# Patient Record
Sex: Male | Born: 1950 | Race: Black or African American | Hispanic: No | Marital: Married | Smoking: Never smoker
Health system: Southern US, Community
[De-identification: ages and names within clinical notes are randomized; demographics above are authoritative.]

## PROBLEM LIST (undated history)

## (undated) DIAGNOSIS — E785 Hyperlipidemia, unspecified: Secondary | ICD-10-CM

## (undated) DIAGNOSIS — I6523 Occlusion and stenosis of bilateral carotid arteries: Secondary | ICD-10-CM

## (undated) DIAGNOSIS — N400 Enlarged prostate without lower urinary tract symptoms: Secondary | ICD-10-CM

## (undated) DIAGNOSIS — R3912 Poor urinary stream: Secondary | ICD-10-CM

## (undated) DIAGNOSIS — D509 Iron deficiency anemia, unspecified: Secondary | ICD-10-CM

## (undated) DIAGNOSIS — M1990 Unspecified osteoarthritis, unspecified site: Secondary | ICD-10-CM

## (undated) DIAGNOSIS — I454 Nonspecific intraventricular block: Secondary | ICD-10-CM

## (undated) DIAGNOSIS — Z973 Presence of spectacles and contact lenses: Secondary | ICD-10-CM

## (undated) DIAGNOSIS — R351 Nocturia: Secondary | ICD-10-CM

## (undated) DIAGNOSIS — E291 Testicular hypofunction: Secondary | ICD-10-CM

## (undated) DIAGNOSIS — I10 Essential (primary) hypertension: Secondary | ICD-10-CM

## (undated) DIAGNOSIS — E119 Type 2 diabetes mellitus without complications: Secondary | ICD-10-CM

## (undated) DIAGNOSIS — N529 Male erectile dysfunction, unspecified: Secondary | ICD-10-CM

## (undated) HISTORY — DX: Nonspecific intraventricular block: I45.4

## (undated) HISTORY — PX: WISDOM TOOTH EXTRACTION: SHX21

## (undated) HISTORY — DX: Essential (primary) hypertension: I10

## (undated) HISTORY — DX: Testicular hypofunction: E29.1

## (undated) HISTORY — PX: SHOULDER SURGERY: SHX246

## (undated) HISTORY — DX: Male erectile dysfunction, unspecified: N52.9

## (undated) HISTORY — PX: COLONOSCOPY: SHX174

---

## 2001-12-04 ENCOUNTER — Ambulatory Visit (HOSPITAL_COMMUNITY): Admission: RE | Admit: 2001-12-04 | Discharge: 2001-12-04 | Payer: Self-pay | Admitting: Orthopedic Surgery

## 2001-12-04 ENCOUNTER — Encounter: Payer: Self-pay | Admitting: Orthopedic Surgery

## 2002-01-18 ENCOUNTER — Encounter: Admission: RE | Admit: 2002-01-18 | Discharge: 2002-01-18 | Payer: Self-pay | Admitting: Orthopedic Surgery

## 2002-01-18 ENCOUNTER — Encounter: Payer: Self-pay | Admitting: Orthopedic Surgery

## 2003-11-17 ENCOUNTER — Ambulatory Visit: Payer: Self-pay | Admitting: Adult Health

## 2003-12-12 ENCOUNTER — Ambulatory Visit: Payer: Self-pay | Admitting: Pulmonary Disease

## 2004-09-03 ENCOUNTER — Ambulatory Visit: Payer: Self-pay | Admitting: Pulmonary Disease

## 2004-10-05 ENCOUNTER — Ambulatory Visit: Payer: Self-pay | Admitting: Pulmonary Disease

## 2004-11-18 ENCOUNTER — Ambulatory Visit: Payer: Self-pay | Admitting: Pulmonary Disease

## 2004-11-22 ENCOUNTER — Ambulatory Visit: Payer: Self-pay | Admitting: Pulmonary Disease

## 2005-05-23 ENCOUNTER — Ambulatory Visit: Payer: Self-pay | Admitting: Pulmonary Disease

## 2005-05-25 ENCOUNTER — Ambulatory Visit: Payer: Self-pay | Admitting: Pulmonary Disease

## 2005-10-20 ENCOUNTER — Ambulatory Visit: Payer: Self-pay | Admitting: Pulmonary Disease

## 2005-10-31 ENCOUNTER — Ambulatory Visit: Payer: Self-pay | Admitting: Pulmonary Disease

## 2006-03-03 ENCOUNTER — Emergency Department (HOSPITAL_COMMUNITY): Admission: EM | Admit: 2006-03-03 | Discharge: 2006-03-03 | Payer: Self-pay | Admitting: Emergency Medicine

## 2006-05-01 ENCOUNTER — Ambulatory Visit: Payer: Self-pay | Admitting: Pulmonary Disease

## 2006-05-01 LAB — CONVERTED CEMR LAB
ALT: 24 units/L (ref 0–40)
AST: 25 units/L (ref 0–37)
Albumin: 3.8 g/dL (ref 3.5–5.2)
Alkaline Phosphatase: 54 units/L (ref 39–117)
BUN: 12 mg/dL (ref 6–23)
Basophils Absolute: 0 10*3/uL (ref 0.0–0.1)
Basophils Relative: 0.5 % (ref 0.0–1.0)
Bilirubin, Direct: 0.1 mg/dL (ref 0.0–0.3)
CO2: 31 meq/L (ref 19–32)
Calcium: 9.1 mg/dL (ref 8.4–10.5)
Chloride: 107 meq/L (ref 96–112)
Cholesterol: 191 mg/dL (ref 0–200)
Creatinine, Ser: 1 mg/dL (ref 0.4–1.5)
Eosinophils Absolute: 0.2 10*3/uL (ref 0.0–0.6)
Eosinophils Relative: 2.5 % (ref 0.0–5.0)
GFR calc Af Amer: 100 mL/min
GFR calc non Af Amer: 83 mL/min
Glucose, Bld: 123 mg/dL — ABNORMAL HIGH (ref 70–99)
HCT: 43.8 % (ref 39.0–52.0)
HDL: 59.8 mg/dL (ref >39.0)
Hemoglobin: 14.9 g/dL (ref 13.0–17.0)
Hgb A1c MFr Bld: 7.1 % — ABNORMAL HIGH (ref 4.6–6.0)
LDL Cholesterol: 119 mg/dL — ABNORMAL HIGH (ref 0–99)
Lymphocytes Relative: 29.5 % (ref 12.0–46.0)
MCHC: 34.1 g/dL (ref 30.0–36.0)
MCV: 82.9 fL (ref 78.0–100.0)
Monocytes Absolute: 0.4 10*3/uL (ref 0.2–0.7)
Monocytes Relative: 5.6 % (ref 3.0–11.0)
Neutro Abs: 4.1 10*3/uL (ref 1.4–7.7)
Neutrophils Relative %: 61.9 % (ref 43.0–77.0)
PSA: 0.73 ng/mL (ref 0.10–4.00)
Platelets: 209 10*3/uL (ref 150–400)
Potassium: 4 meq/L (ref 3.5–5.1)
RBC: 5.29 M/uL (ref 4.22–5.81)
RDW: 12.8 % (ref 11.5–14.6)
Sodium: 142 meq/L (ref 135–145)
TSH: 1.09 microintl units/mL (ref 0.35–5.50)
Total Bilirubin: 0.8 mg/dL (ref 0.3–1.2)
Total CHOL/HDL Ratio: 3.2
Total Protein: 6.7 g/dL (ref 6.0–8.3)
Triglycerides: 59 mg/dL (ref 0–149)
VLDL: 12 mg/dL (ref 0–40)
WBC: 6.7 10*3/uL (ref 4.5–10.5)

## 2006-08-10 ENCOUNTER — Ambulatory Visit: Payer: Self-pay | Admitting: Emergency Medicine

## 2006-08-25 ENCOUNTER — Ambulatory Visit: Payer: Self-pay | Admitting: Pulmonary Disease

## 2006-08-25 LAB — CONVERTED CEMR LAB
BUN: 9 mg/dL (ref 6–23)
CO2: 30 meq/L (ref 19–32)
Calcium: 9.2 mg/dL (ref 8.4–10.5)
Chloride: 101 meq/L (ref 96–112)
Creatinine, Ser: 0.9 mg/dL (ref 0.4–1.5)
GFR calc Af Amer: 113 mL/min
GFR calc non Af Amer: 93 mL/min
Glucose, Bld: 86 mg/dL (ref 70–99)
Hgb A1c MFr Bld: 6.4 % — ABNORMAL HIGH (ref 4.6–6.0)
Potassium: 3.8 meq/L (ref 3.5–5.1)
Sodium: 139 meq/L (ref 135–145)

## 2006-11-09 ENCOUNTER — Ambulatory Visit: Payer: Self-pay | Admitting: Pulmonary Disease

## 2006-11-09 DIAGNOSIS — IMO0001 Reserved for inherently not codable concepts without codable children: Secondary | ICD-10-CM

## 2006-11-09 DIAGNOSIS — E785 Hyperlipidemia, unspecified: Secondary | ICD-10-CM

## 2006-11-09 DIAGNOSIS — I10 Essential (primary) hypertension: Secondary | ICD-10-CM | POA: Insufficient documentation

## 2006-11-16 ENCOUNTER — Ambulatory Visit: Payer: Self-pay

## 2006-12-14 ENCOUNTER — Telehealth (INDEPENDENT_AMBULATORY_CARE_PROVIDER_SITE_OTHER): Payer: Self-pay | Admitting: *Deleted

## 2006-12-15 ENCOUNTER — Telehealth (INDEPENDENT_AMBULATORY_CARE_PROVIDER_SITE_OTHER): Payer: Self-pay | Admitting: *Deleted

## 2007-03-27 ENCOUNTER — Ambulatory Visit: Payer: Self-pay | Admitting: Pulmonary Disease

## 2007-03-27 DIAGNOSIS — J40 Bronchitis, not specified as acute or chronic: Secondary | ICD-10-CM | POA: Insufficient documentation

## 2007-03-27 DIAGNOSIS — E119 Type 2 diabetes mellitus without complications: Secondary | ICD-10-CM | POA: Insufficient documentation

## 2007-03-27 DIAGNOSIS — I679 Cerebrovascular disease, unspecified: Secondary | ICD-10-CM

## 2007-04-01 LAB — CONVERTED CEMR LAB
BUN: 9 mg/dL (ref 6–23)
CO2: 30 meq/L (ref 19–32)
Calcium: 9.6 mg/dL (ref 8.4–10.5)
Chloride: 101 meq/L (ref 96–112)
Creatinine, Ser: 0.9 mg/dL (ref 0.4–1.5)
GFR calc Af Amer: 113 mL/min
GFR calc non Af Amer: 93 mL/min
Glucose, Bld: 95 mg/dL (ref 70–99)
Hgb A1c MFr Bld: 6.9 % — ABNORMAL HIGH (ref 4.6–6.0)
Potassium: 4.4 meq/L (ref 3.5–5.1)
Sodium: 138 meq/L (ref 135–145)

## 2007-04-12 ENCOUNTER — Telehealth: Payer: Self-pay | Admitting: Pulmonary Disease

## 2007-05-11 ENCOUNTER — Telehealth: Payer: Self-pay | Admitting: Pulmonary Disease

## 2007-06-27 ENCOUNTER — Ambulatory Visit: Payer: Self-pay | Admitting: Internal Medicine

## 2007-06-27 DIAGNOSIS — K140 Glossitis: Secondary | ICD-10-CM

## 2007-07-02 ENCOUNTER — Telehealth: Payer: Self-pay | Admitting: Pulmonary Disease

## 2007-09-27 ENCOUNTER — Ambulatory Visit: Payer: Self-pay | Admitting: Pulmonary Disease

## 2007-10-01 ENCOUNTER — Ambulatory Visit: Payer: Self-pay | Admitting: Pulmonary Disease

## 2007-10-04 LAB — CONVERTED CEMR LAB
ALT: 24 units/L (ref 0–53)
AST: 26 units/L (ref 0–37)
Albumin: 3.7 g/dL (ref 3.5–5.2)
Alkaline Phosphatase: 48 units/L (ref 39–117)
BUN: 13 mg/dL (ref 6–23)
Basophils Absolute: 0 10*3/uL (ref 0.0–0.1)
Basophils Relative: 0.3 % (ref 0.0–3.0)
Bilirubin, Direct: 0.1 mg/dL (ref 0.0–0.3)
CO2: 33 meq/L — ABNORMAL HIGH (ref 19–32)
Calcium: 9 mg/dL (ref 8.4–10.5)
Chloride: 105 meq/L (ref 96–112)
Cholesterol: 186 mg/dL (ref 0–200)
Creatinine, Ser: 1 mg/dL (ref 0.4–1.5)
Eosinophils Absolute: 0.1 10*3/uL (ref 0.0–0.7)
Eosinophils Relative: 2.2 % (ref 0.0–5.0)
GFR calc Af Amer: 100 mL/min
GFR calc non Af Amer: 82 mL/min
Glucose, Bld: 123 mg/dL — ABNORMAL HIGH (ref 70–99)
HCT: 43.3 % (ref 39.0–52.0)
HDL: 60.6 mg/dL (ref >39.0)
Hemoglobin: 14.4 g/dL (ref 13.0–17.0)
Hgb A1c MFr Bld: 6.8 % — ABNORMAL HIGH (ref 4.6–6.0)
LDL Cholesterol: 116 mg/dL — ABNORMAL HIGH (ref 0–99)
Lymphocytes Relative: 40.2 % (ref 12.0–46.0)
MCHC: 33.2 g/dL (ref 30.0–36.0)
MCV: 86.9 fL (ref 78.0–100.0)
Monocytes Absolute: 0.5 10*3/uL (ref 0.1–1.0)
Monocytes Relative: 7.7 % (ref 3.0–12.0)
Neutro Abs: 3.1 10*3/uL (ref 1.4–7.7)
Neutrophils Relative %: 49.6 % (ref 43.0–77.0)
PSA: 0.6 ng/mL (ref 0.10–4.00)
Platelets: 203 10*3/uL (ref 150–400)
Potassium: 4.2 meq/L (ref 3.5–5.1)
RBC: 4.98 M/uL (ref 4.22–5.81)
RDW: 13.3 % (ref 11.5–14.6)
Sodium: 143 meq/L (ref 135–145)
TSH: 2.23 microintl units/mL (ref 0.35–5.50)
Total Bilirubin: 0.7 mg/dL (ref 0.3–1.2)
Total CHOL/HDL Ratio: 3.1
Total Protein: 6.5 g/dL (ref 6.0–8.3)
Triglycerides: 48 mg/dL (ref 0–149)
VLDL: 10 mg/dL (ref 0–40)
WBC: 6.2 10*3/uL (ref 4.5–10.5)

## 2008-03-25 ENCOUNTER — Ambulatory Visit: Payer: Self-pay | Admitting: Pulmonary Disease

## 2008-03-30 LAB — CONVERTED CEMR LAB
BUN: 11 mg/dL (ref 6–23)
CO2: 31 meq/L (ref 19–32)
Calcium: 9.4 mg/dL (ref 8.4–10.5)
Chloride: 102 meq/L (ref 96–112)
Creatinine, Ser: 0.9 mg/dL (ref 0.4–1.5)
GFR calc non Af Amer: 112.07 mL/min (ref >60)
Glucose, Bld: 85 mg/dL (ref 70–99)
Hgb A1c MFr Bld: 6.9 % — ABNORMAL HIGH (ref 4.6–6.5)
Potassium: 4.2 meq/L (ref 3.5–5.1)
Sodium: 141 meq/L (ref 135–145)

## 2008-05-29 ENCOUNTER — Telehealth: Payer: Self-pay | Admitting: Pulmonary Disease

## 2008-05-29 DIAGNOSIS — M25569 Pain in unspecified knee: Secondary | ICD-10-CM | POA: Insufficient documentation

## 2008-08-07 ENCOUNTER — Ambulatory Visit: Payer: Self-pay | Admitting: Adult Health

## 2008-08-07 LAB — CONVERTED CEMR LAB
BUN: 13 mg/dL (ref 6–23)
Bacteria, UA: NONE SEEN
Basophils Absolute: 0 10*3/uL (ref 0.0–0.1)
Basophils Relative: 1 % (ref 0–1)
Bilirubin Urine: NEGATIVE
CO2: 25 meq/L (ref 19–32)
Calcium: 9.6 mg/dL (ref 8.4–10.5)
Chloride: 101 meq/L (ref 96–112)
Creatinine, Ser: 0.96 mg/dL (ref 0.40–1.50)
Eosinophils Absolute: 0.1 10*3/uL (ref 0.0–0.7)
Eosinophils Relative: 2 % (ref 0–5)
Glucose, Bld: 95 mg/dL (ref 70–99)
HCT: 41.8 % (ref 39.0–52.0)
Hemoglobin, Urine: NEGATIVE
Hemoglobin: 14.4 g/dL (ref 13.0–17.0)
Hgb A1c MFr Bld: 6.5 % — ABNORMAL HIGH (ref 4.6–6.1)
Ketones, ur: NEGATIVE mg/dL
Leukocytes, UA: NEGATIVE
Lymphocytes Relative: 33 % (ref 12–46)
Lymphs Abs: 2.4 10*3/uL (ref 0.7–4.0)
MCHC: 34.4 g/dL (ref 30.0–36.0)
MCV: 80.9 fL (ref 78.0–100.0)
Monocytes Absolute: 0.5 10*3/uL (ref 0.1–1.0)
Monocytes Relative: 8 % (ref 3–12)
Neutro Abs: 4 10*3/uL (ref 1.7–7.7)
Neutrophils Relative %: 57 % (ref 43–77)
Nitrite: NEGATIVE
Platelets: 234 10*3/uL (ref 150–400)
Potassium: 4.4 meq/L (ref 3.5–5.3)
Protein, ur: NEGATIVE mg/dL
RBC / HPF: NONE SEEN (ref <3)
RBC: 5.17 M/uL (ref 4.22–5.81)
RDW: 13.7 % (ref 11.5–15.5)
Sodium: 140 meq/L (ref 135–145)
Specific Gravity, Urine: 1.016 (ref 1.005–1.030)
Urine Glucose: NEGATIVE mg/dL
Urobilinogen, UA: 0.2 (ref 0.0–1.0)
WBC, UA: NONE SEEN cells/hpf (ref <3)
WBC: 7 10*3/uL (ref 4.0–10.5)
pH: 5.5 (ref 5.0–8.0)

## 2008-08-14 ENCOUNTER — Telehealth: Payer: Self-pay | Admitting: Adult Health

## 2008-08-14 DIAGNOSIS — R42 Dizziness and giddiness: Secondary | ICD-10-CM | POA: Insufficient documentation

## 2008-09-22 ENCOUNTER — Ambulatory Visit: Payer: Self-pay | Admitting: Pulmonary Disease

## 2008-09-22 DIAGNOSIS — E291 Testicular hypofunction: Secondary | ICD-10-CM

## 2008-09-23 LAB — CONVERTED CEMR LAB
HDL: 57.1 mg/dL (ref >39.00)
LDL Cholesterol: 125 mg/dL — ABNORMAL HIGH (ref 0–99)
Total CHOL/HDL Ratio: 3
Triglycerides: 43 mg/dL (ref 0.0–149.0)
VLDL: 8.6 mg/dL (ref 0.0–40.0)

## 2008-11-18 ENCOUNTER — Encounter: Payer: Self-pay | Admitting: Pulmonary Disease

## 2008-11-19 ENCOUNTER — Ambulatory Visit: Payer: Self-pay

## 2008-11-19 ENCOUNTER — Encounter: Payer: Self-pay | Admitting: Pulmonary Disease

## 2008-12-22 ENCOUNTER — Encounter: Payer: Self-pay | Admitting: Pulmonary Disease

## 2008-12-30 ENCOUNTER — Ambulatory Visit: Payer: Self-pay | Admitting: Pulmonary Disease

## 2009-01-01 ENCOUNTER — Telehealth: Payer: Self-pay | Admitting: Pulmonary Disease

## 2009-04-30 ENCOUNTER — Emergency Department (HOSPITAL_COMMUNITY): Admission: EM | Admit: 2009-04-30 | Discharge: 2009-04-30 | Payer: Self-pay | Admitting: Family Medicine

## 2009-05-25 ENCOUNTER — Ambulatory Visit: Payer: Self-pay | Admitting: Pulmonary Disease

## 2009-05-25 DIAGNOSIS — N529 Male erectile dysfunction, unspecified: Secondary | ICD-10-CM

## 2009-05-26 ENCOUNTER — Ambulatory Visit: Payer: Self-pay | Admitting: Pulmonary Disease

## 2009-05-27 LAB — CONVERTED CEMR LAB
BUN: 14 mg/dL (ref 6–23)
CO2: 30 meq/L (ref 19–32)
Chloride: 107 meq/L (ref 96–112)
Creatinine, Ser: 0.8 mg/dL (ref 0.4–1.5)
Creatinine,U: 134 mg/dL
LDL Cholesterol: 135 mg/dL — ABNORMAL HIGH (ref 0–99)
Microalb Creat Ratio: 0.4 mg/g (ref 0.0–30.0)
Total CHOL/HDL Ratio: 4
Triglycerides: 39 mg/dL (ref 0.0–149.0)
VLDL: 7.8 mg/dL (ref 0.0–40.0)

## 2009-06-11 ENCOUNTER — Ambulatory Visit: Payer: Self-pay | Admitting: Endocrinology

## 2009-07-29 ENCOUNTER — Telehealth (INDEPENDENT_AMBULATORY_CARE_PROVIDER_SITE_OTHER): Payer: Self-pay | Admitting: *Deleted

## 2009-08-03 ENCOUNTER — Telehealth (INDEPENDENT_AMBULATORY_CARE_PROVIDER_SITE_OTHER): Payer: Self-pay | Admitting: *Deleted

## 2009-08-03 ENCOUNTER — Emergency Department (HOSPITAL_COMMUNITY): Admission: EM | Admit: 2009-08-03 | Discharge: 2009-08-03 | Payer: Self-pay | Admitting: Emergency Medicine

## 2009-09-09 ENCOUNTER — Telehealth: Payer: Self-pay | Admitting: Endocrinology

## 2009-09-11 ENCOUNTER — Ambulatory Visit: Payer: Self-pay | Admitting: Endocrinology

## 2009-09-22 ENCOUNTER — Ambulatory Visit: Payer: Self-pay | Admitting: Endocrinology

## 2009-12-07 ENCOUNTER — Ambulatory Visit: Payer: Self-pay | Admitting: Endocrinology

## 2010-02-04 NOTE — Progress Notes (Signed)
Summary: referral to ortho for knee pain  Phone Note Call from Patient Call back at Home Phone [phone]   Caller: Patient Call For: nadel Summary of Call: pt was seen at urgent care re: swollen L knee. needs a referral to RA dr.  Initial call taken by: Tivis Ringer, CNA,  August 03, 2009 5:11 PM  Follow-up for Phone Call        Pt c/o right throbbing knee pain with increased stiffness and swelling x 2 weeks. Pt was seen today @ [facility] and was given Voltaren Gel 1% four times a day. Pt states has been taking Mobic 7.5mg  Take 1 tablet by mouth once a day w/o relief. Pt has been elevating leg and "taking it easy". I asked pt if ok that SN address same in the AM and he agreed it is not a problem and wants to wait for SN recs. Please advise. Thanks. Asberry Lascola CMA  August 03, 2009 5:45 PM   Additional Follow-up for Phone Call Additional follow up Details #1::        per SN---rec ortho eval---either orthopedist of his choice or refer to Dr. Ivar Drape CMA  August 04, 2009 8:58 AM   called spoke with patient.  he states that he has seen an ortho doc before where we referred him in the past.  per EMR this was Dr. Angelia Mould.  pt okay to see him/her again.  order placed in EMR for appt. Additional Follow-up by: Boone Master CNA/MA,  August 04, 2009 10:06 AM    New/Updated Medications: VOLTAREN 1 % GEL (DICLOFENAC SODIUM) apply four times a day

## 2010-02-04 NOTE — Progress Notes (Signed)
Summary: refill  Phone Note Call from Patient   Caller: Patient Call For: nadel Summary of Call: pt requests refill for [facility] 7.5mg - [facility] [address] (pt says he was told to call here for this). pt work # [phone] Initial call taken by: Tivis Ringer, CNA,  July 29, 2009 10:11 AM  Follow-up for Phone Call        lmovm for pt to make him aware that the mobic has been sent to his pharmacy-- Randell Loop CMA  July 29, 2009 10:18 AM     Prescriptions: MOBIC 7.5 MG TABS (MELOXICAM) take 1 tab as needed for arthritis pain...  #30 x 3   Entered by:   Randell Loop CMA   Authorized by:   Michele Mcalpine MD   Signed by:   Randell Loop CMA on 07/29/2009   Method used:   Electronically to        [facility] [address] (retail)       [phone]       [place], [place]  [address]       Ph: [phone]       Fax: [phone]   RxID:   [id number]

## 2010-02-04 NOTE — Progress Notes (Signed)
Summary: metformin  Phone Note Refill Request Message from:  Fax from Pharmacy on September 09, 2009 9:03 AM  Refills Requested: Medication #1:  METFORMIN HCL 500 MG XR24H-TAB 2 tabs two times a day   Dosage confirmed as above?Dosage Confirmed   Notes: 90 day supply  Method Requested: Fax to Mail Away Pharmacy Initial call taken by: Brenton Grills MA,  September 09, 2009 9:04 AM    Prescriptions: METFORMIN HCL 500 MG XR24H-TAB (METFORMIN HCL) 2 tabs two times a day  #360 x 2   Entered by:   Brenton Grills MA   Authorized by:   Minus Breeding MD   Signed by:   Brenton Grills MA on 09/09/2009   Method used:   Faxed to ...       [facility] (mail-order)             , [place]         Ph: [phone]       Fax: [phone]   RxID:   [id number]

## 2010-02-04 NOTE — Assessment & Plan Note (Signed)
Summary: PER ASHLEY OV--STC   Vital Signs:  Patient profile:   60 year old male Height:      67 inches (170.18 cm) Weight:      162.13 pounds (73.70 kg) BMI:     25.48 O2 Sat:      98 % on Room air Temp:     97.7 degrees F (36.50 degrees C) oral Pulse rate:   53 / minute BP sitting:   128 / 80  (left arm) Cuff size:   regular  Vitals Entered By: Brenton Grills MA (September 22, 2009 7:57 AM)  O2 Flow:  Room air CC: F/U appt/aj Is Patient Diabetic? Yes   Primary Wael Maestas:  [name]  CC:  F/U appt/aj.  History of Present Illness: pt states he feels well in general.  he takes metformin as rx'ed.   pt states few mos of moderate polyuria, but no edema of the legs.  no assoc weight gain.  Current Medications (verified): 1)  Adult Aspirin Ec Low Strength 81 Mg  Tbec (Aspirin) .... Take 1 Tablet By Mouth Once A Day 2)  Atenolol 50 Mg Tabs (Atenolol) .... Take 1 Tab By Mouth Once Daily.Marland KitchenMarland Kitchen 3)  Viagra 100 Mg Tabs (Sildenafil Citrate) .... Use As Directed... 4)  Mobic 7.5 Mg Tabs (Meloxicam) .... Take 1 Tab As Needed For Arthritis Pain... 5)  Multivitamins   Tabs (Multiple Vitamin) .... Take 1 Tablet By Mouth Once A Day 6)  Metformin Hcl 500 Mg Xr24h-Tab (Metformin Hcl) .... 2 Tabs Two Times A Day 7)  Voltaren 1 % Gel (Diclofenac Sodium) .... Apply Four Times A Day  Allergies (verified): 1)  ! * Magic Mouth Wash 2)  ! Tetracycline 3)  ! Zithromax 4)  ! 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  no dysuria  Physical Exam  Pulses:  dorsalis pedis intact bilat.  Extremities:   no deformity.  no ulcer on the feet.  feet are of normal color and temp.  no edema  Neurologic:  cn 2-12 grossly intact.   readily moves all 4's.   sensation is intact to touch on the feet  Additional Exam:  a1c=6.6   Impression & Recommendations:  Problem # 1:  DIABETES MELLITUS (ICD-250.00) well-controlled  Problem # 2:  polyuria new uncertain etiology  Other Orders: Est. Patient Level III (16109) Est. Patient Level IV (60454)  Patient Instructions: 1)  same medications 2)  go to lab in 3 mos for a1c 250.00. 3)  Please schedule a follow-up appointment in 6 months. 4)  please measure the amount of urine in a 24-hr period, and call me with the amount.

## 2010-02-04 NOTE — Assessment & Plan Note (Signed)
Summary: NEW ENDO CON/ BCBS/ DM/BCBS/ NWS   Vital Signs:  Patient profile:   60 year old male Height:      67 inches Weight:      174.50 pounds BMI:     27.43 O2 Sat:      98 % on Room air Temp:     97.7 degrees F oral Pulse rate:   55 / minute BP sitting:   152 / 84  (right arm) Cuff size:   regular  Vitals Entered By: Margaret Pyle, CMA (June 11, 2009 9:35 AM)  O2 Flow:  Room air CC: New Endo - DM   Primary Provider:  [name]  CC:  New Endo - DM.  History of Present Illness: pt states 15 years h/o dm.  he denies knowing of any chronic complications, exept for mild atherosclerosis of the carotids.   he has never been on insulin.  he takes metformin, which was recently increased to 2x500 mg qd.  pt says his diet and exercise are "fair."   he checks cbg's "not as often as i should." symptomatically, pt states 6 mos of slight numbness of the feet, and associated polyuria.     Current Medications (verified): 1)  Adult Aspirin Ec Low Strength 81 Mg  Tbec (Aspirin) .... Take 1 Tablet By Mouth Once A Day 2)  Atenolol 50 Mg Tabs (Atenolol) .... Take 1 Tab By Mouth Once Daily.Marland KitchenMarland Kitchen 3)  Metformin Hcl 500 Mg  Tb24 (Metformin Hcl) .[name].. 1 Tab By Mouth Two Times A Day  For Dm... 4)  Viagra 100 Mg Tabs (Sildenafil Citrate) .... Use As Directed... 5)  Mobic 7.5 Mg Tabs (Meloxicam) .... Take 1 Tab As Needed For Arthritis Pain... 6)  Multivitamins   Tabs (Multiple Vitamin) .... Take 1 Tablet By Mouth Once A Day  Allergies (verified): 1)  ! * Magic Mouth Wash 2)  ! Tetracycline 3)  ! Zithromax 4)  ! 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 Father died age 59 w/ DM Mother died age 82 w/ heart disease 2 Siblings: 1 Bro w/ DM, 1 Sis w/ heart trouble  Social History: Reviewed history from 12/30/2008 and no changes required. Married, wife= [name], 22 yrs. No children Cigar smoker - quit smoking Jan 2010 Soc Etoh Caffeine- 2 cups Exercise- regularly  Review of Systems       denies blurry vision, headache, chest pain, sob, n/v, dysuria,excessive diaphoresis, memory loss, depression, and rhinorrhea. he has lost a few lbs, due to his efforts.  he has occsaional muscle cramps.  viagra helps ed sxs.  he has slightly easy bruising  Physical Exam  General:  normal appearance.   Head:  head: no deformity eyes: no periorbital swelling, no proptosis external nose and ears are normal mouth: no lesion seen Neck:  Supple without thyroid enlargement or tenderness.  Lungs:  Clear to auscultation bilaterally. Normal respiratory effort.  Heart:  Regular rate and rhythm without murmurs or gallops noted. Normal S1,S2.   Abdomen:  abdomen is soft, nontender.  no hepatosplenomegaly.   not distended.  no hernia  Msk:  muscle bulk and strength are grossly normal.  no obvious joint swelling.  gait is normal and steady  Pulses:  dorsalis pedis intact  bilat.  no carotid bruit  Extremities:  no deformity.  no ulcer on the feet.  feet are of normal color and temp.  no edema  Neurologic:  cn 2-12 grossly intact.   readily moves all 4's.   sensation is intact to touch on the feet  Skin:  normal texture and temp.  no rash.  not diaphoretic  Cervical Nodes:  No significant adenopathy.  Psych:  Alert and cooperative; normal mood and affect; normal attention span and concentration.   Additional Exam:  a1c=7.1 LDL Cholesterol      [H]  161 mg/dL    Impression & Recommendations:  Problem # 1:  DIABETES MELLITUS (ICD-250.00) Assessment  Improved  Problem # 2:  * CAROTID STENOSIS mild  Problem # 3:  HYPERCHOLESTEROLEMIA, BORDERLINE (ICD-272.4) rx of this helps mitigate #2, and complications of #1  Problem # 4:  numbness possibly due to #1  Medications Added to Medication List This Visit: 1)  Metformin Hcl 500 Mg Xr24h-tab (Metformin hcl) .... 2 tabs two times a day  Other Orders: Consultation Level IV (09604)  Patient Instructions: 1)  good diet and exercise habits significanly improve the control of your diabetes.  please let me know if you wish to be referred to a dietician.  high blood sugar is very risky to your health.  you should see an eye doctor every year. 2)  controlling your blood pressure and cholesterol drastically reduces the damage diabetes does to your body.  this also applies to quitting smoking.  please discuss these with your doctor.  you should take an aspirin every day, unless you have been advised by a doctor not to. 3)  check your blood sugar 1 time a day.  vary the time of day when you check, between before the 3 meals, and at bedtime.  also check if you have symptoms of your blood sugar being too high or too low.  please keep a record of the readings and bring it to your next appointment here.  please call us sooner if you are having low blood sugar episodes. 4)  for now, increase metformin to 2x500 mg two times a day 5)  recehck a1c in 3 months.  please call [phone] to hear your test results. 6)  Please schedule a follow-up appointment in 6 months. Prescriptions: METFORMIN HCL 500 MG XR24H-TAB (METFORMIN HCL) 2 tabs two times a day  #120 x 11   Entered and Authorized by:   Minus Breeding MD   Signed by:   Minus Breeding MD on 06/11/2009   Method used:   Electronically to        [facility] [address] (retail)       [phone]       [place], [place]  [address]       Ph: [phone]       Fax: [phone]   RxID:   [id number]

## 2010-02-04 NOTE — Assessment & Plan Note (Signed)
Summary: 5 month follow up visit/kcw   Primary Care Provider:  Juliane Guest  CC:  5 MONTH FOLLOW UP--STOPPED THE MOBIC SINCE THE KNEE PAIN WENT AWAY.  History of Present Illness: 60 y/o BM here for a follow up visit... he has mult med problems as noted below    ~  Mar10:  he has been doing well  except for some right knee pain... no prev eval or Rx... we discussed trial of Mobic 7.5mg /d and refer to ortho if symptoms persisit...  ~  Sep10:  saw TP last month w/ "dizziness" & nothing found- symptom resolved spontaneously... he's been using the Mobic Prn & it helps his intermittent knee pain... notes ?Raynaud's symptoms w/ finger discomfort handling cold objects on & off (intermittent, not progressive, etc)... we discussed poss additionof Nifedipine if symptoms worsen... his BP has been up lately- 150+ at home & we decided to incr the Bystolic to 10...  ~  Dec10:  last OV we incr the Bystolic to 10mg  & BP is much better now, but the insurance co has incr it to Tier3- therefore requests change (try ATENOLOL50)... 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 he isn't in favor of taking this med regularly at this time & we will follow up...   ~  May 25, 2009:  feeling well w/o new complaints or concerns... BP at home in the 130-140's/ 80's & tol the Atenolol well... remains on diet, ASA, Metformin> weight down 4# & he'll ret for fasting blood work this week... energy remains good etc...    Current Problems:   Hx of BRONCHITIS (ICD-490) - he smokes an occas cigar... no recent symptoms.  HYPERTENSION (ICD-401.9) - prev on Bystolic10 w/ good control of BP but it went to Tier3 & changed to lower copay- ATENOLOL 50mg /d... BP=142/82 today, and similar at home... denies HA, fatigue, visual changes, CP, palipit, dizziness, syncope,  dyspnea, edema, etc...   CEREBROVASCULAR DISEASE (ICD-437.9) - on ASA 81mg /d... asymptomatic...  ~  CDopplers 11/08 w/ mild plaque in CCA, 0-39%bilat ICAstenoses... f/u planned 88yrs.  ~  CDopplers 11/10 showed stable mild carotid dis bilat- 1-39% bilat ICA stenoses... f/u 83yrs.  HYPERCHOLESTEROLEMIA, BORDERLINE (ICD-272.4) - on diet alone...  ~  FLP 4/08 showed TChol 191, TG 59, HDL 60, LDL 119... discussed diet + exerc efforts.  ~  FLP 9/09 (wt=173#) showed TChol 186, TG 48, HDL 61, LDL 116  ~  FLP 9/10 (wt=175#) showed TChol 191, TG 43, HDL 57, LDL 125  ~  FLP 5/11 (wt=181#) showed TChol   DIABETES MELLITUS (ICD-250.00) - on METFORMIN 500mg /d... his weight is up 7# more to 185# today... BS at home range 100's to 140's... we discussed low carb- no sweets etc...   ~  labs 8/08 showed BS=86, HGA1c=6.4  ~  labs 3/09 showed BS= 95, HgA1c= 6.9  ~  labs 9/09 showed BS= 123, HgA1c= 6.8  ~  labs 3/10 showed BS= 85, A1c= 6.9  ~  labs 8/10 showed BS= 95, A1c= 6.5  ~  labs 5/11 showed BS=   Colonoscopy 12/04 by drPatterson was WNL... f/u planned 51yrs.  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 we tried Rx w/ androgel x78mo but pt stopped it siting no diff  in how he felt & did n't want to continue... we discussed options including shots for Low-T at the Urology center but he declines Rx... also has some ED issues & uses VIAGRA 100mg  Prn.  ? of FIBROMYALGIA (ICD-729.1) & KNEE PAIN (ICD-719.46) - doing well lately without fatigue, without pain... he uses MOBIC 7.5mg  Prn...  Health Maintenance - had TETANUS shot 2003;  neg colonoscopy 12/04 by DrPatterson; PSA= all normal (0.68 9/10)...   Allergies: 1)  ! * Magic Mouth Wash 2)  ! Tetracycline 3)  ! Zithromax 4)  ! Mavik (Trandolapril)  Comments:  Nurse/Medical Assistant: The patient's medications and allergies were reviewed with  the patient and were updated in the Medication and Allergy Lists.  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 Father died age 74 w/ DM Mother died age 38 w/ heart disease 2 Siblings: 1 Bro w/ DM, 1 Sis w/ heart trouble  Social History: Reviewed history from 12/30/2008 and no changes required. Married, wife= [name], 22 yrs. No children Cigar smoker - quit smoking Jan 2010 Soc Etoh Caffeine- 2 cups Exercise- regularly  Review of Systems      See HPI  The patient denies anorexia, fever, weight loss, weight gain, vision loss, decreased hearing, hoarseness, chest pain, syncope, dyspnea on exertion, peripheral edema, prolonged cough, headaches, hemoptysis, abdominal pain, melena, hematochezia, severe indigestion/heartburn, hematuria, incontinence, muscle weakness, suspicious skin lesions, transient blindness, difficulty walking, depression, unusual weight change, abnormal bleeding, enlarged lymph nodes, and angioedema.    Vital Signs:  Patient profile:   60 year old male Height:      66.5 inches Weight:      181 pounds O2 Sat:      99 % on Room air Temp:     97.7 degrees F oral Pulse rate:   51 / minute BP sitting:   142 / 82  (right arm) Cuff size:   regular  Vitals Entered By: Randell Loop CMA (May 25, 2009 2:09 PM)  O2 Sat at Rest %:  99 O2 Flow:  Room air CC: 5 MONTH FOLLOW UP--STOPPED THE MOBIC SINCE THE KNEE PAIN WENT AWAY Is Patient Diabetic? Yes Pain Assessment Patient in pain? no      Comments MEDS UPDATED TODAY   Physical Exam  Additional Exam:  WD, WN, 60 y/o BM in NAD... GENERAL:  Alert &  oriented; pleasant & cooperative... HEENT:  [place]/AT, EOM-wnl, PERRLA, EACs-clear, TMs-wnl, NOSE-clear, THROAT-clear & wnl NECK:  Supple w/ full ROM; no JVD; normal carotid impulses w/o bruits; no thyromegaly or nodules palpated; no lymphadenopathy. CHEST:  Clear to P & A; without wheezes/ rales/ or rhonchi. HEART:  Regular Rhythm; without murmurs/ rubs/ or gallops. ABDOMEN:  Soft & nontender; normal bowel sounds; no organomegaly or masses detected. (RECTAL:  sm testes, 2-3+ prostate smooth w/o lesions; stool heme neg) EXT: without deformities or arthritic changes; no varicose veins/ venous insuffic/ or edema. NEURO:  CN's intact; motor testing normal; sensory testing normal; gait normal & balance OK. DERM:  No lesions noted; no rash etc...    MISC. Report  Procedure date:  05/25/2009  Findings:      Pt will return to our lab one morning this week for his f/u FASTING blood work... we will review these studies and contact him w/  the results & discuss any needed change in therapy...  SN   Impression & Recommendations:  Problem # 1:  HYPERTENSION (ICD-401.9) Controlled>  same med. His updated medication list for this problem includes:    Atenolol 50 Mg Tabs (Atenolol) .[name]... Take 1 tab by mouth once daily...  Problem # 2:  CEREBROVASCULAR DISEASE (ICD-437.9) Stable CDoppler 11/10>  f/u due 9yrs... continue ASA.  Problem # 3:  HYPERCHOLESTEROLEMIA, BORDERLINE (ICD-272.4) On diet alone>  he will ret for FLP...  Problem # 4:  DIABETES MELLITUS (ICD-250.00) Stable on the Metformin>  he will ret for FBS & A1c. His updated medication list for this problem includes:    Adult Aspirin Ec Low Strength 81 Mg Tbec (Aspirin) .[name]... Take 1 tablet by mouth once a day    Metformin Hcl 500 Mg Tb24 (Metformin hcl) .[name]... 1 tab by mouth once daily for dm...  Problem # 5:  TESTICULAR HYPOFUNCTION (ICD-257.2) Discussed again w/ pt>  he reassures me that he feels well, energy is good, and function is  acceptable... not on angrogen replacement therapy at this time.  Problem # 6:  KNEE PAIN (ICD-719.46) Prn Mobic works well for him... His updated medication list for this problem includes:    Adult Aspirin Ec Low Strength 81 Mg Tbec (Aspirin) .[name]... Take 1 tablet by mouth once a day    Mobic 7.5 Mg Tabs (Meloxicam) .[name]... Take 1 tab as needed for arthritis pain...  Complete Medication List: 1)  Adult Aspirin Ec Low Strength 81 Mg Tbec (Aspirin) .... Take 1 tablet by mouth once a day 2)  Atenolol 50 Mg Tabs (Atenolol) .... Take 1 tab by mouth once daily.Marland KitchenMarland Kitchen 3)  Metformin Hcl 500 Mg Tb24 (Metformin hcl) .[name].. 1 tab by mouth once daily for dm... 4)  Viagra 100 Mg Tabs (Sildenafil citrate) .... Use as directed... 5)  Mobic 7.5 Mg Tabs (Meloxicam) .... Take 1 tab as needed for arthritis pain.Marland KitchenMarland Kitchen 6)  Multivitamins Tabs (Multiple vitamin) .... Take 1 tablet by mouth once a day  Patient Instructions: 1)  Today we updated your med list- see below.... 2)  Continue your current medications the same... 3)  Please return to our lab one morning this week for your FASTING blood work... please call the "phone tree" in a few days for your lab results.Marland KitchenMarland Kitchen 4)  Call for any problems.Marland KitchenMarland Kitchen 5)  Please schedule a follow-up appointment in 6 months.

## 2010-03-23 ENCOUNTER — Ambulatory Visit: Payer: BC Managed Care – PPO

## 2010-03-23 DIAGNOSIS — E119 Type 2 diabetes mellitus without complications: Secondary | ICD-10-CM

## 2010-03-23 LAB — GLUCOSE, CAPILLARY: Glucose-Capillary: 110 mg/dL — ABNORMAL HIGH (ref 70–99)

## 2010-03-24 ENCOUNTER — Encounter: Payer: Self-pay | Admitting: Endocrinology

## 2010-03-24 ENCOUNTER — Ambulatory Visit (INDEPENDENT_AMBULATORY_CARE_PROVIDER_SITE_OTHER): Payer: BC Managed Care – PPO | Admitting: Endocrinology

## 2010-03-24 VITALS — BP 136/70 | HR 57 | Temp 98.3°F | Ht 67.0 in | Wt 159.6 lb

## 2010-03-24 DIAGNOSIS — E119 Type 2 diabetes mellitus without complications: Secondary | ICD-10-CM

## 2010-03-24 NOTE — Patient Instructions (Signed)
Continue metformin. The addition of actos or [place] might be able to delay the time you need insulin.  Please call if you want to add either of these. Please return here in 6 months, with a1c blood test a few days prior.   controlling your blood pressure and cholesterol drastically reduces the damage diabetes does to your body.  this also applies to quitting smoking.  please discuss these with your doctor.  you should take an aspirin every day, unless you have been advised by a doctor not to. good diet and exercise habits significanly improve the control of your diabetes.  please let me know if you wish to be referred to a dietician.  high blood sugar is very risky to your health.  you should see an eye doctor every year.

## 2010-03-24 NOTE — Progress Notes (Signed)
  Subjective:    Patient ID: [name], male    DOB: Apr 19, 1950, 59 y.o.   MRN: [id number]  HPI pt states he feels well in general.  He takes metformin as rx'ed.  He is worried about the eventual day he will have to take insulin, and wants to know how he might be able to delay that day.  Past Medical History  Diagnosis Date  . History of bronchitis   . Hypertension   . Cerebrovascular disease, unspecified   . Other and unspecified hyperlipidemia     Hypercholesterolemia, Borderline  . Diabetes mellitus   . Testicular hypofunction   . Impotence of organic origin   . Fibromyalgia   . Knee pain    No past surgical history on file.  reports that he quit smoking about 2 years ago. His smoking use included Cigars. He does not have any smokeless tobacco history on file. He reports that he drinks alcohol. His drug history not on file. family history includes Diabetes in his brother and father and Heart disease in his mother and sister. Allergies  Allergen Reactions  . Azithromycin     REACTION: rash  . Tetracycline     REACTION: rash  . Trandolapril     REACTION: ALLERGIC to ACEs w/ Angioedema    Review of Systems Denies weight change.    Objective:   Physical Exam Gen: no distress Neck: no goiter       Assessment & Plan:  Dm, well-controlled.  Some specialists believe a lower a1c would delay disease progression, if it could be done without hypoglycemia.

## 2010-05-03 ENCOUNTER — Encounter: Payer: Self-pay | Admitting: Pulmonary Disease

## 2010-05-03 ENCOUNTER — Ambulatory Visit (INDEPENDENT_AMBULATORY_CARE_PROVIDER_SITE_OTHER): Payer: BC Managed Care – PPO | Admitting: Pulmonary Disease

## 2010-05-03 VITALS — BP 124/84 | HR 60 | Temp 97.7°F | Ht 66.5 in | Wt 160.8 lb

## 2010-05-03 DIAGNOSIS — R5383 Other fatigue: Secondary | ICD-10-CM

## 2010-05-03 DIAGNOSIS — E785 Hyperlipidemia, unspecified: Secondary | ICD-10-CM

## 2010-05-03 DIAGNOSIS — E119 Type 2 diabetes mellitus without complications: Secondary | ICD-10-CM

## 2010-05-03 DIAGNOSIS — R5381 Other malaise: Secondary | ICD-10-CM

## 2010-05-03 DIAGNOSIS — R531 Weakness: Secondary | ICD-10-CM

## 2010-05-03 DIAGNOSIS — I679 Cerebrovascular disease, unspecified: Secondary | ICD-10-CM

## 2010-05-03 DIAGNOSIS — E291 Testicular hypofunction: Secondary | ICD-10-CM

## 2010-05-03 DIAGNOSIS — Z0000 Encounter for general adult medical examination without abnormal findings: Secondary | ICD-10-CM

## 2010-05-03 DIAGNOSIS — Z125 Encounter for screening for malignant neoplasm of prostate: Secondary | ICD-10-CM

## 2010-05-03 DIAGNOSIS — I10 Essential (primary) hypertension: Secondary | ICD-10-CM

## 2010-05-03 NOTE — Progress Notes (Signed)
Subjective:    Patient ID: [name], male    DOB: [birth year], 60 y.o.   MRN: [id number]  HPI 60 y/o BM here for a follow up visit... he has mult med problems as noted below   ~  May 25, 2009:  feeling well w/o new complaints or concerns... BP at home in the 130-140's/ 80's & tol the Atenolol well... remains on diet, ASA, Metformin> weight down 4# & he'll ret for fasting blood work this week... energy remains good etc...  ~  May 03, 2010:  31mo ROV & his DM is now expertly managed by DrEllison for Endocrinology & his Metformin has been increased to 2tabsBid w/ good control;  He has lost 20# on diet program to 161# today;  BP remains well controlled on meds below;  He denies CP, palpit, SOB, cerebral ischemic symptoms, edema, etc...  Fasting labs done> everything looks good x low testosterone level and he would like to try medication for this now, therefore rec rx w/ ANDROGEL 4pumps daily w/ repeat level in 6weeks (he is encouraged to discuss this prob w/ his endocrinologist as well)...         Problem List:   Hx of BRONCHITIS (ICD-490) - he smokes an occas cigar... no recent symptoms.  HYPERTENSION (ICD-401.9) - on ATENOLOL 50mg /d...  ~  5/11:  BP=142/82 and similar at home... denies HA, fatigue, visual changes, CP, palipit, dizziness, syncope, dyspnea, edema, etc...  ~  4/12:  BP= 124/84 today & feeling well- remains largely asymptomatic...  CEREBROVASCULAR DISEASE (ICD-437.9) - on ASA 81mg /d... ~  CDopplers 11/08 w/ mild plaque in CCA, 0-39%bilat ICAstenoses... f/u planned 79yrs. ~  CDopplers 11/10 showed stable mild carotid dis bilat- 1-39% bilat ICA stenoses... f/u 53yrs. ~  4/12:  He denies any cerebral ischemic symptoms;  f/u CDopplers due 11/12...  HYPERCHOLESTEROLEMIA, BORDERLINE (ICD-272.4) - on diet alone... ~  FLP 4/08 showed TChol 191, TG 59, HDL 60, LDL 119... discussed diet + exerc efforts. ~  FLP 9/09 (wt=173#) showed TChol 186, TG 48, HDL 61, LDL 116 ~  FLP 9/10  (wt=175#) showed TChol 191, TG 43, HDL 57, LDL 125 ~  FLP 5/11 (wt=181#) showed TChol 194, TG 39, HDL 51, LDL 135... LDL not at goal needs better diet, wt reduction, or consider meds. ~  FLP 4/12 (wt=161#) showed TChol 170, TG 40, HDL 55, LDL 107... Great job w/ wt reduction!  DIABETES MELLITUS (ICD-250.00) - on METFORMIN 500mg - 2Bid now per DrEllison... ~  labs 8/08 showed BS=86, HGA1c=6.4.Marland KitchenMarland Kitchen on Metformin500mg /d. ~  labs 3/09 showed BS= 95, HgA1c= 6.9 ~  labs 9/09 showed BS= 123, HgA1c= 6.8.Marland KitchenMarland Kitchen continue same. ~  labs 3/10 showed BS= 85, A1c= 6.9 ~  labs 8/10 showed BS= 95, A1c= 6.5.Marland KitchenMarland Kitchen continue same. ~  labs 5/11 showed BS= 126, A1c= 7.1.Marland KitchenMarland Kitchen Pt requested Endocrine referral. ~  DrEllison increased his Metformin to 2Bid... ~  Labs 5/12 showed BS= 95, A1c=6.3  Colonoscopy 12/04 by DrPatterson was WNL... f/u planned 57yrs.  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 we tried Rx w/ androgel x29mo but pt stopped it siting no diff in how he felt & didn't want to continue... we discussed options including shots for Low-T at the Urology center but he declines Rx... also has some ED issues & uses VIAGRA 100mg  Prn. ~  5/12: pt reqested f/u Testosterone level = 193 (350-890) same as prev but now he  would like to try Rx again; REC> ANDROGEL PUMP 4 pumps daily & rec repeat testosterone level in 6 weeks, also recommended to discuss this prob w/ his Endocrinologist...  ? of FIBROMYALGIA (ICD-729.1) & KNEE PAIN (ICD-719.46) - doing well lately without fatigue, without pain... he uses MOBIC 7.5mg  Prn...  Health Maintenance - had TETANUS shot 2003;  neg colonoscopy 12/04 by DrPatterson; PSA= all normal (0.68 9/10)...   No past surgical history on file.   Outpatient Encounter Prescriptions as of 05/03/2010  Medication Sig Dispense Refill  . aspirin 81 MG tablet Take 81 mg by mouth daily.        [name]  atenolol (TENORMIN) 50 MG tablet Take 50 mg by mouth daily.        . diclofenac sodium (VOLTAREN) 1 % GEL Apply topically 4 (four) times daily.        . meloxicam (MOBIC) 7.5 MG tablet Take 7.5 mg by mouth daily as needed. For arthritis pain       . metFORMIN (GLUCOPHAGE-XR) 500 MG 24 hr tablet 2 tablets by mouth two times a day       . Multiple Vitamin (MULTIVITAMIN) tablet Take 1 tablet by mouth daily.        . sildenafil (VIAGRA) 100 MG tablet Take 100 mg by mouth daily as needed. Use as directed         Allergies  Allergen Reactions  . Azithromycin     REACTION: rash  . Tetracycline     REACTION: rash  . Trandolapril     REACTION: ALLERGIC to ACEs w/ Angioedema    Review of Systems         See HPI - all other systems neg except as noted... The patient denies anorexia, fever, weight loss, weight gain, vision loss, decreased hearing, hoarseness, chest pain, syncope, dyspnea on exertion, peripheral edema, prolonged cough, headaches, hemoptysis, abdominal pain, melena, hematochezia, severe indigestion/heartburn, hematuria, incontinence, muscle weakness, suspicious skin lesions, transient blindness, difficulty walking, depression, unusual weight change, abnormal bleeding, enlarged lymph nodes, and angioedema.     Objective:   Physical Exam      WD, WN, 60 y/o BM in NAD... GENERAL:  Alert & oriented; pleasant & cooperative... HEENT:  [place]/AT, EOM-wnl, PERRLA, EACs-clear, TMs-wnl, NOSE-clear, THROAT-clear & wnl NECK:  Supple w/ full ROM; no JVD; normal carotid impulses w/o bruits; no thyromegaly or nodules palpated; no lymphadenopathy. CHEST:  Clear to P & A; without wheezes/ rales/ or rhonchi. HEART:  Regular Rhythm; without murmurs/ rubs/ or gallops. ABDOMEN:  Soft & nontender; normal bowel sounds; no organomegaly or masses detected. (RECTAL:  sm testes, 2-3+ prostate smooth w/o lesions; stool heme neg) EXT: without deformities or arthritic changes; no varicose veins/ venous insuffic/  or edema. NEURO:  CN's intact; motor testing normal; sensory testing normal; gait normal & balance OK. DERM:  No lesions noted; no rash etc...   Assessment & Plan:   HBP>  Controlled on BBlocker, great job w/ wt reduction, continue same...  Cerebrovasc dis>  Stable, asymptomatic on ASA, f/u CDopplers due 11/12...  CHOL>  Improved w/ wt reduction, keep up the good work...  DM>  Followed by drEllison now, on Metformin 500mg - 2Bid, continue same...  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  Other medical problems as noted>  Labs otherw look good.Marland KitchenMarland Kitchen

## 2010-05-03 NOTE — Patient Instructions (Signed)
Ikaika, it was good seeing you again... We updated your medication list in our new EPIC system... Continue your current meds the same...  Please return to our lab one morning this week for your fasting blood work...    Then call the PHONE TREE in a few days for your results...    Dial [id number] & when prompted enter your patient number followed by the # symbol...    Your patient number is:  [id number]#  Great job w/ weight reduction!!!  Your weight is normal right where you are now... Call for any problems.Marland KitchenMarland Kitchen

## 2010-05-05 ENCOUNTER — Other Ambulatory Visit (INDEPENDENT_AMBULATORY_CARE_PROVIDER_SITE_OTHER): Payer: BC Managed Care – PPO

## 2010-05-05 DIAGNOSIS — R531 Weakness: Secondary | ICD-10-CM

## 2010-05-05 DIAGNOSIS — Z125 Encounter for screening for malignant neoplasm of prostate: Secondary | ICD-10-CM

## 2010-05-05 DIAGNOSIS — E785 Hyperlipidemia, unspecified: Secondary | ICD-10-CM

## 2010-05-05 DIAGNOSIS — E291 Testicular hypofunction: Secondary | ICD-10-CM

## 2010-05-05 DIAGNOSIS — R5381 Other malaise: Secondary | ICD-10-CM

## 2010-05-05 DIAGNOSIS — E119 Type 2 diabetes mellitus without complications: Secondary | ICD-10-CM

## 2010-05-05 DIAGNOSIS — I10 Essential (primary) hypertension: Secondary | ICD-10-CM

## 2010-05-05 DIAGNOSIS — R5383 Other fatigue: Secondary | ICD-10-CM

## 2010-05-05 LAB — LIPID PANEL
HDL: 55.2 mg/dL (ref >39.00)
Total CHOL/HDL Ratio: 3
VLDL: 8 mg/dL (ref 0.0–40.0)

## 2010-05-05 LAB — CBC WITH DIFFERENTIAL/PLATELET
Basophils Absolute: 0 10*3/uL (ref 0.0–0.1)
Eosinophils Absolute: 0.1 10*3/uL (ref 0.0–0.7)
Lymphocytes Relative: 33 % (ref 12.0–46.0)
MCHC: 33.1 g/dL (ref 30.0–36.0)
MCV: 87.2 fl (ref 78.0–100.0)
Monocytes Absolute: 0.4 10*3/uL (ref 0.1–1.0)
Neutrophils Relative %: 57.9 % (ref 43.0–77.0)
Platelets: 198 10*3/uL (ref 150.0–400.0)
RBC: 4.72 Mil/uL (ref 4.22–5.81)
RDW: 14.1 % (ref 11.5–14.6)

## 2010-05-05 LAB — BASIC METABOLIC PANEL
CO2: 29 mEq/L (ref 19–32)
Calcium: 9.1 mg/dL (ref 8.4–10.5)
GFR: 131.21 mL/min (ref >60.00)
Potassium: 3.9 mEq/L (ref 3.5–5.1)
Sodium: 140 mEq/L (ref 135–145)

## 2010-05-05 LAB — HEPATIC FUNCTION PANEL
ALT: 27 U/L (ref 0–53)
AST: 27 U/L (ref 0–37)
Alkaline Phosphatase: 53 U/L (ref 39–117)
Bilirubin, Direct: 0.1 mg/dL (ref 0.0–0.3)
Total Protein: 6.4 g/dL (ref 6.0–8.3)

## 2010-05-05 LAB — TESTOSTERONE: Testosterone: 192.96 ng/dL — ABNORMAL LOW (ref 350.00–890.00)

## 2010-05-05 LAB — HEMOGLOBIN A1C: Hgb A1c MFr Bld: 6.3 % (ref 4.6–6.5)

## 2010-05-05 LAB — TSH: TSH: 1.68 u[IU]/mL (ref 0.35–5.50)

## 2010-05-06 ENCOUNTER — Encounter: Payer: Self-pay | Admitting: Pulmonary Disease

## 2010-05-13 ENCOUNTER — Telehealth: Payer: Self-pay | Admitting: Pulmonary Disease

## 2010-05-13 NOTE — Telephone Encounter (Signed)
Leigh, did you call this pt? I am assuming that you just wanted to tell him about the Low T and androgel. Pls advise if anything further? Thanks!

## 2010-05-13 NOTE — Telephone Encounter (Signed)
Called pt at mobile number and it has been d/c'ed- left msg on his home phone TCB.

## 2010-05-13 NOTE — Telephone Encounter (Signed)
Was calling pt about his low t--recs per SN to start on the androgel pump   4 pumps applied daily and refill prn.  Will need a repeat testosterone level in 6 wks while on this med.  He should also discuss this with his endocrinologist at his next ov.  thanks

## 2010-05-14 MED ORDER — TESTOSTERONE 12.5 MG/ACT (1%) TD GEL: 4.0000 [drp] | Freq: Every day | TRANSDERMAL | Status: DC

## 2010-05-14 NOTE — Telephone Encounter (Signed)
lmomtcb x1 

## 2010-05-14 NOTE — Telephone Encounter (Signed)
Patient returning call.[phone]

## 2010-05-14 NOTE — Telephone Encounter (Signed)
Rx was called to [facility]

## 2010-05-14 NOTE — Telephone Encounter (Signed)
Spoke with pt and notified of recs per SN.  He verbalized understanding. Pls advise what strength androgel to send thanks!

## 2010-05-14 NOTE — Telephone Encounter (Signed)
Per SN--1 % gel--4 pumps applied to the skin daily as directed. thanks

## 2010-05-18 NOTE — Assessment & Plan Note (Signed)
 HEALTHCARE                             PULMONARY OFFICE NOTE   NAME:ROBINSONBoyde, Grieco                     MRN:          [id number]  DATE:08/10/2006                            DOB:          16-Aug-1950    HISTORY OF PRESENT ILLNESS:  Patient is a 60 year old African American  male patient of Dr. Jodelle Green who has a known history of bronchitis,  hypertension, and hyperlipidemia, presents today for an acute office  visit. Patient complains that this morning when he woke up he had  swelling of the lips. He was seen at urgent care around lunch time today  and instructed to follow up with his primary care physician. Patient  noted approximately 2 weeks ago that he woke up with his lower lip, it  was slightly puffy. And within 1 day it had resolved. Patient denies any  associated chest pain, palpitations, difficultly swallowing, tongue or  eye swelling, recent travel, or antibiotic use. Patient denies any new  medications or change in dietary habit.   PAST MEDICAL HISTORY:  Hypertension, gastroesophageal reflux disease,  borderline hyperglycemia, borderline dyslipidemia.   CURRENT MEDICATIONS:  1. Mavik 4 mg daily.  2. Aspirin 81 mg daily.  3. Multivitamin daily.  4. Pepcid p.r.n.   DRUG ALLERGIES:  MAGIC MOUTHWASH AND ZITHROMAX AND TETRACYCLINE.   PHYSICAL EXAMINATION:  Patient is a pleasant male in no acute distress.  He is afebrile with stable vital signs.  HEENT: Conjunctivae noninjected. TM s normal. Nasal mucosa is pink and  moist. Poster pharynx is clear without any swelling or exudate. Upper  and lower lips are swollen and puffing.  NECK: Supple without cervical adenopathy. No JVD.  LUNG SOUNDS: Clear.  CARDIAC: Regular rate.  ABDOMEN: Soft and nontender.  EXTREMITIES: Warm without any edema.   IMPRESSION AND PLAN:  1. Acute onset of lip swelling consistent with angioedema. Patient is      on ACE inhibitor which is the most likely culprit of  this. I have      recommended that he stop Mavik. And use Zyrtec 10 mg at bedtime for      the next 5 days. May also use Pepcid AC times 3 days daily. Patient      is instructed that if symptoms do not improve or worsen he is to      contact us for sooner follow up or seek emergency room attention.  2. Hypertension, patient will stop Mavik, will change over to Bystolic      5 mg daily. Samples      were given and we will recheck here in 2 weeks for follow up with      Dr. Kriste Basque or sooner if needed.      Rubye Oaks, NP  Electronically Signed      Lonzo Cloud. Kriste Basque, MD  Electronically Signed   TP/MedQ  DD: 08/10/2006  DT: 08/11/2006  Job #: [id number]

## 2010-05-20 ENCOUNTER — Telehealth: Payer: Self-pay | Admitting: Pulmonary Disease

## 2010-05-20 NOTE — Telephone Encounter (Signed)
Spoke w/ [facility] and they state the medication need PA. They states they faxed it over yesterday and medication was called in 05/13/10. [facility] is going to refax form. Pt is aware medication needs PA and can take up 2 weeks to get a response back. Will hold in triage until form comes through

## 2010-05-20 NOTE — Telephone Encounter (Signed)
Spoke w/ [facility] and advised them still have not received for to initiate PA. Gave them triage fax # to send it to. Will await fax

## 2010-05-21 NOTE — Assessment & Plan Note (Signed)
[facility]                               PULMONARY OFFICE NOTE   NAME:[name]                     MRN:          [id number]  DATE:10/20/2005                            DOB:          02/04/1950    HISTORY OF PRESENT ILLNESS:  The patient is a 60 year old African American  male patient of Dr. Jodelle Mcguire, who has a known history of hypertension,  presents complaining of a 2-week history of intermittent dizziness.  The  patient complains of some nasal congestion, body aches and intermittent  episodes of lightheadedness when he stands up from a sitting position and  turns his head quickly.  The patient denies any headache, visual or speech  changes, extremity weakness, chest pain, palpitations, presyncopal or  syncopal episodes.  The patient has not taken any medications for treatment.   PAST MEDICAL HISTORY:  1. Hypertension.  2. Bronchitis.  3. Mild hyperglycemia.   CURRENT MEDICATIONS:  1. Mavik 4 mg daily.  2. Aspirin daily.  3. A multivitamin daily.   PHYSICAL EXAMINATION:  The patient is a pleasant black male in no acute  distress.  He is afebrile, stable vital signs.  Blood pressure is 130/90.  Weight is at 167.  HEENT:  PERRLA.  EOMI without nystagmus.  The posterior pharynx is clear.  TMs are normal.  The nasal mucosa was slightly swollen and pale.  NECK:  Supple without cervical adenopathy.  No JVD.  LUNGS:  Lung sounds are clear to auscultation bilaterally.  CARDIAC:  S1, S2 without murmur, rub or gallop.  ABDOMEN:  Soft without any palpable organomegaly.  EXTREMITIES:  Warm without any calf tenderness, cyanosis, clubbing or edema.  Equal strength in the upper and lower extremities.  NEUROLOGIC:  Exam reveals cranial nerves II-XII are intact.  Moves all  extremities well.  Negative Romberg.  Steady gait.  Heel-to-toe walking is  normal.  Normal hand grips.  Negative pronator drift.  Negative tremor.  Head maneuvers with  reproducible symptoms.  However, negative for nystagmus.   IMPRESSION AND PLAN:  Mild rhinitis and suspect benign positional vertigo.  The patient is to begin Nasacort AQ 2 puffs twice daily.  Claritin daily for  2 weeks, and may use meclizine 25 mg  one-half to a whole tablet every 8 hours as needed.  Change positions slowly  and increase fluid intake.  The patient will recheck with Dr. Kriste Mcguire as  scheduled in 2 weeks, or sooner if needed.      ______________________________  Rubye Oaks, NP    ______________________________  Lonzo Cloud. Harold Basque, MD     TP/MedQ  DD:  10/20/2005  DT:  10/22/2005  Job #:  [id number]

## 2010-05-24 ENCOUNTER — Encounter: Payer: Self-pay | Admitting: *Deleted

## 2010-05-24 NOTE — Telephone Encounter (Signed)
I spoke to [facility] again and they provided the PA number [phone]. I ATC the number and [employer] was closed for the day. Will call tomorrow. Carron Curie, CMA

## 2010-05-25 NOTE — Telephone Encounter (Signed)
Form was completed before but the MD info was blank so I completed the form again and placed it on SN look-at to sign. Carron Curie, CMA

## 2010-05-25 NOTE — Telephone Encounter (Signed)
PA initiated and will await form to give to Dr. Kriste Basque. Carron Curie, CMA

## 2010-05-25 NOTE — Telephone Encounter (Signed)
PA form was completed and faxed back to [facility]

## 2010-06-02 NOTE — Telephone Encounter (Signed)
PT CALLED BACK AGAIN- STILL WAITING FOR RX TO BE CALLED IN TO [facility] ON [address]. CELL [phone]. Hazel Sams

## 2010-06-02 NOTE — Telephone Encounter (Signed)
I informed pt we are pending status of PA. Same was faxed 05/25/10. Pt states he was on the Androgel that was in "a packet" before and did not need a PA. I advised the pt we need to allow the PA process to complete and will take the next step per insurance co suggestions.

## 2010-06-08 ENCOUNTER — Ambulatory Visit (INDEPENDENT_AMBULATORY_CARE_PROVIDER_SITE_OTHER): Payer: BC Managed Care – PPO | Admitting: Adult Health

## 2010-06-08 ENCOUNTER — Encounter: Payer: Self-pay | Admitting: Adult Health

## 2010-06-08 VITALS — BP 148/90 | HR 68 | Temp 97.0°F | Ht 67.0 in | Wt 161.4 lb

## 2010-06-08 DIAGNOSIS — J069 Acute upper respiratory infection, unspecified: Secondary | ICD-10-CM

## 2010-06-08 MED ORDER — CEFDINIR 300 MG PO CAPS: 300.0000 mg | ORAL_CAPSULE | Freq: Two times a day (BID) | ORAL | Status: AC

## 2010-06-08 NOTE — Patient Instructions (Signed)
Mucinex DM Twice daily  As needed  Cough/congestion  Fluids and rest  Tylenol As needed   Allegra 180mg  daily As needed  Drainage.  Omnicef 300mg  Twice daily to have on hold if symptoms do not improve or worsen.  Please contact office for sooner follow up if symptoms do not improve or worsen or seek emergency care

## 2010-06-08 NOTE — Assessment & Plan Note (Signed)
Mucinex DM Twice daily  As needed  Cough/congestion  Fluids and rest  Tylenol As needed   Allegra 180mg daily As needed  Drainage.  Omnicef 300mg Twice daily to have on hold if symptoms do not improve or worsen.  Please contact office for sooner follow up if symptoms do not improve or worsen or seek emergency care   

## 2010-06-08 NOTE — Progress Notes (Signed)
Subjective:    Patient ID: [name], male    DOB: 1950/12/21, 60 y.o.   MRN: [id number]  HPI 60 y/o BM here for a follow up visit... he has mult med problems as noted below   ~  May 25, 2009:  feeling well w/o new complaints or concerns... BP at home in the 130-140's/ 80's & tol the Atenolol well... remains on diet, ASA, Metformin> weight down 4# & he'll ret for fasting blood work this week... energy remains good etc...  ~  May 03, 2010:  22mo ROV & his DM is now expertly managed by DrEllison for Endocrinology & his Metformin has been increased to 2tabsBid w/ good control;  He has lost 20# on diet program to 161# today;  BP remains well controlled on meds below;  He denies CP, palpit, SOB, cerebral ischemic symptoms, edema, etc...  Fasting labs done> everything looks good x low testosterone level and he would like to try medication for this now, therefore rec rx w/ ANDROGEL 4pumps daily w/ repeat level in 6weeks (he is encouraged to discuss this prob w/ his endocrinologist as well)...  ~June 08, 2010 Acute OV  Pt presents for an acute office visit. Complains of sore throat, drainage, cough , minimally productive and body aches for 1 week.. NO otc used. He is leaving on vacation in few days to [place] and does not want to be sick. NO fever , recent travel, tick/insect exposure, or abx use.  Sore throat is better today but still feels bad. Has gotten up some yellow mucus at times.          Problem List:   Hx of BRONCHITIS (ICD-490) - he smokes an occas cigar... no recent symptoms.  HYPERTENSION (ICD-401.9) - on ATENOLOL 50mg /d...  ~  5/11:  BP=142/82 and similar at home... denies HA, fatigue, visual changes, CP, palipit, dizziness, syncope, dyspnea, edema, etc...  ~  4/12:  BP= 124/84 today & feeling well- remains largely asymptomatic...  CEREBROVASCULAR DISEASE (ICD-437.9) - on ASA 81mg /d... ~  CDopplers 11/08 w/ mild plaque in CCA, 0-39%bilat ICAstenoses... f/u planned 49yrs. ~  CDopplers  11/10 showed stable mild carotid dis bilat- 1-39% bilat ICA stenoses... f/u 16yrs. ~  4/12:  He denies any cerebral ischemic symptoms;  f/u CDopplers due 11/12...  HYPERCHOLESTEROLEMIA, BORDERLINE (ICD-272.4) - on diet alone... ~  FLP 4/08 showed TChol 191, TG 59, HDL 60, LDL 119... discussed diet + exerc efforts. ~  FLP 9/09 (wt=173#) showed TChol 186, TG 48, HDL 61, LDL 116 ~  FLP 9/10 (wt=175#) showed TChol 191, TG 43, HDL 57, LDL 125 ~  FLP 5/11 (wt=181#) showed TChol 194, TG 39, HDL 51, LDL 135... LDL not at goal needs better diet, wt reduction, or consider meds. ~  FLP 4/12 (wt=161#) showed TChol 170, TG 40, HDL 55, LDL 107... Great job w/ wt reduction!  DIABETES MELLITUS (ICD-250.00) - on METFORMIN 500mg - 2Bid now per DrEllison... ~  labs 8/08 showed BS=86, HGA1c=6.4.Marland KitchenMarland Kitchen on Metformin500mg /d. ~  labs 3/09 showed BS= 95, HgA1c= 6.9 ~  labs 9/09 showed BS= 123, HgA1c= 6.8.Marland KitchenMarland Kitchen continue same. ~  labs 3/10 showed BS= 85, A1c= 6.9 ~  labs 8/10 showed BS= 95, A1c= 6.5.Marland KitchenMarland Kitchen continue same. ~  labs 5/11 showed BS= 126, A1c= 7.1.Marland KitchenMarland Kitchen Pt requested Endocrine referral. ~  DrEllison increased his Metformin to 2Bid... ~  Labs 5/12 showed BS= 95, A1c=6.3  Colonoscopy 12/04 by DrPatterson was WNL... f/u planned 29yrs.  TESTICULAR HYPOFUNCTION (ICD-257.2)  ERECTILE DYSFUNCTION, ORGANIC (  OZH-086.57) - noted testic atrophy on PE and Testosterone level 9/10 = 190 (350-890)... we tried Rx w/ androgel x12mo but pt stopped it siting no diff in how he felt & didn't want to continue... we discussed options including shots for Low-T at the Urology center but he declines Rx... also has some ED issues & uses VIAGRA 100mg  Prn. ~  5/12: pt reqested f/u Testosterone level = 193 (350-890) same as prev but now he would like to try Rx again; REC> ANDROGEL PUMP 4 pumps daily & rec repeat testosterone level in 6 weeks, also recommended to discuss this prob w/ his Endocrinologist...  ? of FIBROMYALGIA (ICD-729.1) & KNEE PAIN  (ICD-719.46) - doing well lately without fatigue, without pain... he uses MOBIC 7.5mg  Prn...  Health Maintenance - had TETANUS shot 2003;  neg colonoscopy 12/04 by DrPatterson; PSA= all normal (0.68 9/10)...    Review of Systems Constitutional:   No  weight loss, night sweats,  Fevers, chills,  +fatigue, or  lassitude.  HEENT:   No headaches,  Difficulty swallowing,  Tooth/dental problems, or  Sore throat,                No sneezing, itching, ear ache, nasal congestion, post nasal drip,   CV:  No chest pain,  Orthopnea, PND, swelling in lower extremities, anasarca, dizziness, palpitations, syncope.   GI  No heartburn, indigestion, abdominal pain, nausea, vomiting, diarrhea, change in bowel habits, loss of appetite, bloody stools.   Resp: No shortness of breath with exertion or at rest.    No coughing up of blood.    No wheezing.  No chest wall deformity  Skin: no rash or lesions.  GU: no dysuria, change in color of urine, no urgency or frequency.  No flank pain, no hematuria   MS:  No joint pain or swelling.  No decreased range of motion.  No back pain.  Psych:  No change in mood or affect. No depression or anxiety.  No memory loss.        Objective:   Physical Exam GEN: A/Ox3; pleasant , NAD, well nourished   HEENT:  [place]/AT,  EACs-clear, TMs-wnl, NOSE-clear drainage THROAT-clear, no lesions, no postnasal drip or exudate noted.   NECK:  Supple w/ fair ROM; no JVD; normal carotid impulses w/o bruits; no thyromegaly or nodules palpated; no lymphadenopathy.  RESP  Clear  P & A; w/o, wheezes/ rales/ or rhonchi.no accessory muscle use, no dullness to percussion  CARD:  RRR, no m/r/g  , no peripheral edema, pulses intact, no cyanosis or clubbing.  GI:   Soft & nt; nml bowel sounds; no organomegaly or masses detected.  Musco: Warm bil, no deformities or joint swelling noted.   Neuro: alert, no focal deficits noted.    Skin: Warm, no lesions or rashes         Assessment  & Plan:

## 2010-06-14 NOTE — Telephone Encounter (Signed)
Unable to find PA approval/denial from pt's insurance - and there is no phone number on the form faxed to [employer] to call to check the status.  Called spoke with pt's retail pharmacy, was informed that the androgel "went through" when the pharm tech tried to process it with me on the phone > which means the medication has been approved.  LMOVM informing pt of this and apologizing for any inconvenience.  Encouraged pt to call with questions / concerns.

## 2010-06-14 NOTE — Telephone Encounter (Signed)
i have not seen any papers on this pt for his PA.

## 2010-06-14 NOTE — Telephone Encounter (Signed)
Leigh, have you received anything on this PA? Pls advise thanks

## 2010-06-17 ENCOUNTER — Telehealth: Payer: Self-pay | Admitting: Pulmonary Disease

## 2010-06-17 MED ORDER — TESTOSTERONE 12.5 MG/ACT (1%) TD GEL: 4.0000 [drp] | Freq: Every day | TRANSDERMAL | Status: DC

## 2010-06-17 NOTE — Telephone Encounter (Signed)
Pt is requesting rx for Androgel.  Per pt's chart, this was already sent in on 05/14/10 x 5 refills.  Spoke with Gregary Signs at [facility] and was informed they did not receive this rx we sent.  Therefore gave a verbal ok x 5 refills. Pt aware rx sent to pharmacy.

## 2010-06-17 NOTE — Telephone Encounter (Signed)
LMOMTCB.  I don't see any documentation in pt's chart stating [name] was going to call in rx for Gel.  Is he meaning androgel or voltaren gel? As both of these are on his med list?

## 2010-06-30 ENCOUNTER — Other Ambulatory Visit: Payer: Self-pay | Admitting: *Deleted

## 2010-06-30 MED ORDER — METFORMIN HCL ER 500 MG PO TB24: ORAL_TABLET | ORAL | Status: DC

## 2010-06-30 NOTE — Telephone Encounter (Signed)
R'cd fax from [facility] pharmacy for Metformin refill  Last OV-03/24/2010

## 2010-07-06 ENCOUNTER — Other Ambulatory Visit: Payer: Self-pay | Admitting: Pulmonary Disease

## 2010-09-18 ENCOUNTER — Encounter: Payer: Self-pay | Admitting: Endocrinology

## 2010-09-18 DIAGNOSIS — E119 Type 2 diabetes mellitus without complications: Secondary | ICD-10-CM

## 2010-09-20 ENCOUNTER — Other Ambulatory Visit (INDEPENDENT_AMBULATORY_CARE_PROVIDER_SITE_OTHER): Payer: BC Managed Care – PPO

## 2010-09-20 DIAGNOSIS — E119 Type 2 diabetes mellitus without complications: Secondary | ICD-10-CM

## 2010-09-20 LAB — HEMOGLOBIN A1C: Hgb A1c MFr Bld: 6.2 % (ref 4.6–6.5)

## 2010-09-22 ENCOUNTER — Ambulatory Visit (INDEPENDENT_AMBULATORY_CARE_PROVIDER_SITE_OTHER): Payer: BC Managed Care – PPO | Admitting: Endocrinology

## 2010-09-22 ENCOUNTER — Encounter: Payer: Self-pay | Admitting: Endocrinology

## 2010-09-22 VITALS — BP 138/86 | HR 52 | Temp 98.2°F | Ht 67.0 in | Wt 165.0 lb

## 2010-09-22 DIAGNOSIS — E119 Type 2 diabetes mellitus without complications: Secondary | ICD-10-CM

## 2010-09-22 MED ORDER — METFORMIN HCL ER 500 MG PO TB24: 1000.0000 mg | ORAL_TABLET | Freq: Two times a day (BID) | ORAL | Status: DC

## 2010-09-22 MED ORDER — PIOGLITAZONE HCL 15 MG PO TABS: 15.0000 mg | ORAL_TABLET | Freq: Every day | ORAL | Status: DC

## 2010-09-22 NOTE — Progress Notes (Signed)
Subjective:    Patient ID: [name], male    DOB: 31-Jan-1950, 60 y.o.   MRN: [id number]  HPI pt states he feels well in general, except he says the metformin causes frequent bm's.  no cbg record, but states cbg's are well-controlled.   Past Medical History  Diagnosis Date  . History of bronchitis   . Hypertension   . Cerebrovascular disease, unspecified   . Other and unspecified hyperlipidemia     Hypercholesterolemia, Borderline  . Diabetes mellitus   . Testicular hypofunction   . Impotence of organic origin   . Fibromyalgia   . Knee pain     No past surgical history on file.  History   Social History  . Marital Status: Married    Spouse Name: [name]    Number of Children: 0  . Years of Education: N/A   Occupational History  .     Social History Main Topics  . Smoking status: Former Smoker    Types: Cigarettes, Cigars    Quit date: 01/04/2008  . Smokeless tobacco: Never Used  . Alcohol Use: Yes     socially  . Drug Use: No  . Sexually Active: Not on file   Other Topics Concern  . Not on file   Social History Narrative   Regular exercise-yesCaffeine-2 cupsMarried=wife [name] 22 yearsCigar Smoker-quit smoking Jan 2010    Current Outpatient Prescriptions on File Prior to Visit  Medication Sig Dispense Refill  . aspirin 81 MG tablet Take 81 mg by mouth daily.        [name] atenolol (TENORMIN) 50 MG tablet TAKE ONE TABLET BY MOUTH EVERY DAY. TO REPLACE BYSTOLIC.  90 tablet  3  . diclofenac sodium (VOLTAREN) 1 % GEL Apply topically 4 (four) times daily.        . meloxicam (MOBIC) 7.5 MG tablet Take 7.5 mg by mouth daily as needed. For arthritis pain       . metFORMIN (GLUCOPHAGE-XR) 500 MG 24 hr tablet TAKE ONE TABLET BY MOUTH TWICE DAILY FOR DIABETES  60 tablet  5  . Multiple Vitamin (MULTIVITAMIN) tablet Take 1 tablet by mouth daily.        . sildenafil (VIAGRA) 100 MG tablet Take 100 mg by mouth daily as needed. Use as directed       . Testosterone (ANDROGEL  PUMP) 1.25 GM/ACT (1%) GEL Place 4 drops onto the skin daily.  1 Bottle  5    Allergies  Allergen Reactions  . Azithromycin     REACTION: rash  . Tetracycline     REACTION: rash  . Trandolapril     REACTION: ALLERGIC to ACEs w/ Angioedema    Family History  Problem Relation Age of Onset  . Heart disease Mother   . Diabetes Father   . Heart disease Sister     heart trouble  . Diabetes Brother     BP 138/86  Pulse 52  Temp(Src) 98.2 F (36.8 C) (Oral)  Ht 5\' 7"  (1.702 m)  Wt 165 lb (74.844 kg)  BMI 25.84 kg/m2  SpO2 98%    Review of Systems denies hypoglycemia    Objective:   Physical Exam Pulses: dorsalis pedis intact bilat.   Feet: no deformity.  no ulcer on the feet.  feet are of normal color and temp.  no edema Neuro: sensation is intact to touch on the feet    Lab Results  Component Value Date   HGBA1C 6.2 09/20/2010  Assessment & Plan:  Dm.  He needs increased rx, if it can be done with a regimen that avoids or minimizes hypoglycemia.

## 2010-09-22 NOTE — Patient Instructions (Addendum)
Add actos 15 mg daily.   Please return here in 6 months, with blood test a few days prior. check your blood sugar 1 time a day.  vary the time of day when you check, between before the 3 meals, and at bedtime.  also check if you have symptoms of your blood sugar being too high or too low.  please keep a record of the readings and bring it to your next appointment here.  please call us sooner if you are having low blood sugar episodes, or if it stays over 200.

## 2010-10-20 ENCOUNTER — Other Ambulatory Visit (INDEPENDENT_AMBULATORY_CARE_PROVIDER_SITE_OTHER): Payer: BC Managed Care – PPO

## 2010-10-20 DIAGNOSIS — E119 Type 2 diabetes mellitus without complications: Secondary | ICD-10-CM

## 2010-10-22 ENCOUNTER — Ambulatory Visit: Payer: BC Managed Care – PPO | Admitting: Pulmonary Disease

## 2010-10-26 ENCOUNTER — Telehealth: Payer: Self-pay | Admitting: Pulmonary Disease

## 2010-10-26 NOTE — Telephone Encounter (Signed)
Pt calling for lab results from 10/20/2010 for his low testosterone count. The only results in the computer are for a Hgb A1C for Dr. Everardo All. The pt says he was to come back for repeat testosterone check after finishing the Androgel and he thought that what the lab was drawing. I don't see an order for this in EPIC and I have apologized to the pt for the order not being placed. He was very understanding and knows we will call him once an order for his labs have been placed and he is willing to come back for the blood draw. Pls advise on ALL labs that are needed for this pt.

## 2010-10-26 NOTE — Telephone Encounter (Signed)
Per SN---if he has been using the androgel regulary--4 pumps daily for a steady 6-8 wks or more, then ok to recheck testosterone level while on rx.  thanks

## 2010-10-26 NOTE — Telephone Encounter (Signed)
lmomtcb for pt 

## 2010-10-27 NOTE — Telephone Encounter (Signed)
LMOMTCB

## 2010-10-28 NOTE — Telephone Encounter (Signed)
lmomtcb x 3. Per office protocol will sign off on message and wait for pt to return call.

## 2010-11-01 ENCOUNTER — Telehealth: Payer: Self-pay | Admitting: Pulmonary Disease

## 2010-11-01 NOTE — Telephone Encounter (Signed)
lmomtcb  

## 2010-11-02 NOTE — Telephone Encounter (Signed)
See prev phone note as follows: Per SN---if he has been using the androgel regulary--4 pumps daily for a steady 6-8 wks or more, then ok to recheck testosterone level while on rx. thanks  The pt states he has been using the androgel reg and wants level checked. Order placed. Pt aware.Carron Curie, CMA

## 2010-11-04 ENCOUNTER — Other Ambulatory Visit (INDEPENDENT_AMBULATORY_CARE_PROVIDER_SITE_OTHER): Payer: BC Managed Care – PPO

## 2010-11-04 DIAGNOSIS — E291 Testicular hypofunction: Secondary | ICD-10-CM

## 2010-11-09 ENCOUNTER — Other Ambulatory Visit: Payer: Self-pay | Admitting: Pulmonary Disease

## 2010-11-09 DIAGNOSIS — E291 Testicular hypofunction: Secondary | ICD-10-CM

## 2010-12-16 ENCOUNTER — Other Ambulatory Visit: Payer: Self-pay | Admitting: *Deleted

## 2010-12-16 DIAGNOSIS — R0989 Other specified symptoms and signs involving the circulatory and respiratory systems: Secondary | ICD-10-CM

## 2010-12-17 ENCOUNTER — Encounter: Payer: BC Managed Care – PPO | Admitting: *Deleted

## 2010-12-17 ENCOUNTER — Encounter (INDEPENDENT_AMBULATORY_CARE_PROVIDER_SITE_OTHER): Payer: BC Managed Care – PPO | Admitting: *Deleted

## 2010-12-17 DIAGNOSIS — R0989 Other specified symptoms and signs involving the circulatory and respiratory systems: Secondary | ICD-10-CM

## 2010-12-17 DIAGNOSIS — I6529 Occlusion and stenosis of unspecified carotid artery: Secondary | ICD-10-CM

## 2010-12-25 ENCOUNTER — Emergency Department (HOSPITAL_COMMUNITY)
Admission: EM | Admit: 2010-12-25 | Discharge: 2010-12-25 | Disposition: A | Discharge status: Home / Self Care | Payer: No Typology Code available for payment source | Attending: Emergency Medicine | Admitting: Emergency Medicine

## 2010-12-25 ENCOUNTER — Encounter (HOSPITAL_COMMUNITY): Payer: Self-pay | Admitting: *Deleted

## 2010-12-25 DIAGNOSIS — M549 Dorsalgia, unspecified: Secondary | ICD-10-CM | POA: Insufficient documentation

## 2010-12-25 DIAGNOSIS — S335XXA Sprain of ligaments of lumbar spine, initial encounter: Secondary | ICD-10-CM | POA: Insufficient documentation

## 2010-12-25 DIAGNOSIS — I10 Essential (primary) hypertension: Secondary | ICD-10-CM | POA: Insufficient documentation

## 2010-12-25 DIAGNOSIS — S39012A Strain of muscle, fascia and tendon of lower back, initial encounter: Secondary | ICD-10-CM

## 2010-12-25 DIAGNOSIS — E119 Type 2 diabetes mellitus without complications: Secondary | ICD-10-CM | POA: Insufficient documentation

## 2010-12-25 MED ORDER — NAPROXEN 375 MG PO TABS: 375.0000 mg | ORAL_TABLET | Freq: Two times a day (BID) | ORAL | Status: AC

## 2010-12-25 MED ORDER — DIAZEPAM 5 MG PO TABS: 5.0000 mg | ORAL_TABLET | Freq: Four times a day (QID) | ORAL | Status: AC | PRN
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
## 2010-12-26 NOTE — ED Provider Notes (Signed)
Medical screening examination/treatment/procedure(s) were performed by non-physician practitioner and as supervising physician I was immediately available for consultation/collaboration.   Lyanne Co, MD 12/26/10 [phone]

## 2011-05-04 ENCOUNTER — Other Ambulatory Visit: Payer: Self-pay | Admitting: *Deleted

## 2011-05-04 MED ORDER — PIOGLITAZONE HCL 15 MG PO TABS: 15.0000 mg | ORAL_TABLET | Freq: Every day | ORAL | Status: DC

## 2011-05-04 NOTE — Telephone Encounter (Signed)
R'cd fax from [facility] for refill of Actos

## 2011-05-16 ENCOUNTER — Telehealth: Payer: Self-pay | Admitting: Pulmonary Disease

## 2011-05-16 ENCOUNTER — Other Ambulatory Visit: Payer: Self-pay

## 2011-05-16 MED ORDER — DICLOFENAC SODIUM 1 % TD GEL: 1.0000 "application " | Freq: Four times a day (QID) | TRANSDERMAL | Status: DC

## 2011-05-16 MED ORDER — METFORMIN HCL ER 500 MG PO TB24: 1000.0000 mg | ORAL_TABLET | Freq: Two times a day (BID) | ORAL | Status: DC

## 2011-05-16 MED ORDER — MELOXICAM 7.5 MG PO TABS: 7.5000 mg | ORAL_TABLET | Freq: Every day | ORAL | Status: DC | PRN

## 2011-05-16 NOTE — Telephone Encounter (Signed)
Both rx's sent to the pharmacy--lm on home phone that this has been done

## 2011-05-19 ENCOUNTER — Telehealth: Payer: Self-pay | Admitting: Pulmonary Disease

## 2011-05-19 NOTE — Telephone Encounter (Signed)
Form has been received for PA for voltaren gel.  Form placed on SN cart for signature.  SN out of the office until Monday.

## 2011-05-23 NOTE — Telephone Encounter (Signed)
Form has been faxed and waiting on approval or denial.

## 2011-05-27 ENCOUNTER — Ambulatory Visit (INDEPENDENT_AMBULATORY_CARE_PROVIDER_SITE_OTHER): Payer: No Typology Code available for payment source | Admitting: Endocrinology

## 2011-05-27 ENCOUNTER — Encounter: Payer: Self-pay | Admitting: Endocrinology

## 2011-05-27 ENCOUNTER — Other Ambulatory Visit (INDEPENDENT_AMBULATORY_CARE_PROVIDER_SITE_OTHER): Payer: No Typology Code available for payment source

## 2011-05-27 ENCOUNTER — Telehealth: Payer: Self-pay | Admitting: *Deleted

## 2011-05-27 VITALS — BP 128/82 | HR 53 | Temp 96.7°F | Ht 67.0 in | Wt 169.0 lb

## 2011-05-27 DIAGNOSIS — E119 Type 2 diabetes mellitus without complications: Secondary | ICD-10-CM

## 2011-05-27 NOTE — Patient Instructions (Addendum)
blood tests are being requested for you today.  You will receive a letter with results. Please come back for a follow-up appointment in 6 months check your blood sugar 1 time a day.  vary the time of day when you check, between before the 3 meals, and at bedtime.  also check if you have symptoms of your blood sugar being too high or too low.  please keep a record of the readings and bring it to your next appointment here.  please call us sooner if you are having low blood sugar episodes, or if it stays over 200.  good diet and exercise habits significanly improve the control of your diabetes.  please let me know if you wish to be referred to a dietician.  high blood sugar is very risky to your health.  you should see an eye doctor every year. controlling your blood pressure and cholesterol drastically reduces the damage diabetes does to your body.  this also applies to quitting smoking.  please discuss these with your doctor.  you should take an aspirin every day, unless you have been advised by a doctor not to. Please see dr Kriste Basque for a regular physical soon.

## 2011-05-27 NOTE — Telephone Encounter (Signed)
Called pt to inform of lab results, left message for pt to callback office (letter also mailed to pt). 

## 2011-05-27 NOTE — Progress Notes (Signed)
Subjective:    Patient ID: [name], male    DOB: [birth year], 61 y.o.   MRN: [id number]  HPI Pt returns for f/u of type 2 DM (dx'ed 1996; complicated by cerebrovascular disease).  pt states he feels well in general.  no cbg record, but states cbg's are well-controlled.   Past Medical History  Diagnosis Date  . History of bronchitis   . Hypertension   . Cerebrovascular disease, unspecified   . Other and unspecified hyperlipidemia     Hypercholesterolemia, Borderline  . Diabetes mellitus   . Testicular hypofunction   . Impotence of organic origin   . Fibromyalgia   . Knee pain     Past Surgical History  Procedure Date  . Shoulder surgery     History   Social History  . Marital Status: Married    Spouse Name: [name]    Number of Children: 0  . Years of Education: N/A   Occupational History  .     Social History Main Topics  . Smoking status: Former Smoker    Types: Cigarettes, Cigars    Quit date: 01/04/2008  . Smokeless tobacco: Never Used  . Alcohol Use: Yes     socially  . Drug Use: No  . Sexually Active: Not on file   Other Topics Concern  . Not on file   Social History Narrative   Regular exercise-yesCaffeine-2 cupsMarried=wife [name] 22 yearsCigar Smoker-quit smoking Jan 2010    Current Outpatient Prescriptions on File Prior to Visit  Medication Sig Dispense Refill  . aspirin 81 MG tablet Take 81 mg by mouth daily.        [name] atenolol (TENORMIN) 50 MG tablet TAKE ONE TABLET BY MOUTH EVERY DAY. TO REPLACE BYSTOLIC.  90 tablet  3  . diclofenac sodium (VOLTAREN) 1 % GEL Apply 1 application topically 4 (four) times daily.  1 Tube  11  . meloxicam (MOBIC) 7.5 MG tablet Take 1 tablet (7.5 mg total) by mouth daily as needed. For arthritis pain  30 tablet  11  . metFORMIN (GLUCOPHAGE-XR) 500 MG 24 hr tablet Take 2 tablets (1,000 mg total) by mouth 2 (two) times daily.  120 tablet  11  . Multiple Vitamin (MULTIVITAMIN) tablet Take 1 tablet by mouth daily.         . naproxen (NAPROSYN) 375 MG tablet Take 1 tablet (375 mg total) by mouth 2 (two) times daily.  20 tablet  0  . pioglitazone (ACTOS) 15 MG tablet Take 1 tablet (15 mg total) by mouth daily.  90 tablet  0  . sildenafil (VIAGRA) 100 MG tablet Take 100 mg by mouth daily as needed. Use as directed       . Testosterone (ANDROGEL PUMP) 1.25 GM/ACT (1%) GEL Place 4 drops onto the skin daily.  1 Bottle  5    Allergies  Allergen Reactions  . Azithromycin     REACTION: rash  . Tetracycline     REACTION: rash  . Trandolapril     REACTION: ALLERGIC to ACEs w/ Angioedema    Family History  Problem Relation Age of Onset  . Heart disease Mother   . Diabetes Father   . Heart disease Sister     heart trouble  . Diabetes Brother     BP 128/82  Pulse 53  Temp(Src) 96.7 F (35.9 C) (Oral)  Ht 5\' 7"  (1.702 m)  Wt 169 lb (76.658 kg)  BMI 26.47 kg/m2  SpO2 98%    Review of  Systems denies weight change    Objective:   Physical Exam VITAL SIGNS:  See vs page GENERAL: no distress Pulses: dorsalis pedis intact bilat.   Feet: no deformity.  no ulcer on the feet.  feet are of normal color and temp.  no edema Neuro: sensation is intact to touch on the feet      Assessment & Plan:  DM, apparently well-controlled

## 2011-05-31 NOTE — Telephone Encounter (Signed)
Form has been refaxed for the voltaren gel--ref #  [id number]  And PCR  [id number].  Will await for approval or denial.

## 2011-05-31 NOTE — Telephone Encounter (Signed)
Left message for pt to callback office.  

## 2011-06-01 ENCOUNTER — Telehealth: Payer: Self-pay | Admitting: Pulmonary Disease

## 2011-06-01 NOTE — Telephone Encounter (Signed)
Pt advised of lab results.  

## 2011-06-01 NOTE — Telephone Encounter (Signed)
Duplicate message.  See other open message for this pt.

## 2011-06-01 NOTE — Telephone Encounter (Signed)
Called spoke with Hilda Lias @ [employer] - she stated that pt's PA for the voltaren gel is not going to be approved as long as question 3 is answered "yes" (asking if pt will be taking an NSAID while on the voltaren gel).  Hilda Lias stated that the form had been faxed twice, with 2 different dates and all the same questions.  Answer needs to be changed from "yes" to "no" with a notation from SN stating that question 3 should be answered "no" and that pt will be advised to stop all NSAIDS while on the voltaren.    Will forward to Leigh.

## 2011-06-01 NOTE — Telephone Encounter (Signed)
lmomtcb for the pt----[employer] will not approve the voltaren gel due to the pt is on mobic and naproxen.  Need to inform pt that he will need to stop taking these 2 meds in order to get the voltaren gel approved through the insurance company.  Waiting on call back from the pt.

## 2011-06-06 NOTE — Telephone Encounter (Signed)
Spoke with pt and notified will need to stop taking mobic and naproxen before we can get voltaren approved. Pt verbalized understanding and states will stop these meds. Will forward back to Leigh to see if there is anything else needed.

## 2011-06-28 ENCOUNTER — Encounter: Payer: Self-pay | Admitting: Pulmonary Disease

## 2011-06-28 ENCOUNTER — Ambulatory Visit (INDEPENDENT_AMBULATORY_CARE_PROVIDER_SITE_OTHER): Payer: BC Managed Care – PPO | Admitting: Pulmonary Disease

## 2011-06-28 ENCOUNTER — Ambulatory Visit (INDEPENDENT_AMBULATORY_CARE_PROVIDER_SITE_OTHER)
Admission: RE | Admit: 2011-06-28 | Discharge: 2011-06-28 | Disposition: A | Discharge status: Home / Self Care | Payer: BC Managed Care – PPO | Source: Ambulatory Visit | Attending: Pulmonary Disease | Admitting: Pulmonary Disease

## 2011-06-28 VITALS — BP 142/88 | HR 62 | Temp 97.0°F | Ht 67.0 in | Wt 168.8 lb

## 2011-06-28 DIAGNOSIS — I679 Cerebrovascular disease, unspecified: Secondary | ICD-10-CM

## 2011-06-28 DIAGNOSIS — I10 Essential (primary) hypertension: Secondary | ICD-10-CM

## 2011-06-28 DIAGNOSIS — E785 Hyperlipidemia, unspecified: Secondary | ICD-10-CM

## 2011-06-28 DIAGNOSIS — M25569 Pain in unspecified knee: Secondary | ICD-10-CM

## 2011-06-28 DIAGNOSIS — E119 Type 2 diabetes mellitus without complications: Secondary | ICD-10-CM

## 2011-06-28 DIAGNOSIS — Z0000 Encounter for general adult medical examination without abnormal findings: Secondary | ICD-10-CM

## 2011-06-28 DIAGNOSIS — E291 Testicular hypofunction: Secondary | ICD-10-CM

## 2011-06-28 NOTE — Patient Instructions (Addendum)
Today we updated your med list in our EPIC system...    Continue your current medications the same...  We discussed strategies to help the night time leg cramps:    Try the Tonic water trick...    Consider the Yellow mustard trick...  We also discussed ways to decr the urinary frequency at night:    Stop drinking fluids at dinner time...    Empty your bladder thoroughly at bedtime...    See if this approach will cut back the number of awakenings...  Today we did your CXR & EKG...    Please return to our lab one morning this week for your FASTING blood work...    We will call you w/ the results when avail.Marland KitchenMarland Kitchen

## 2011-06-30 ENCOUNTER — Other Ambulatory Visit (INDEPENDENT_AMBULATORY_CARE_PROVIDER_SITE_OTHER): Payer: BC Managed Care – PPO

## 2011-06-30 DIAGNOSIS — Z0000 Encounter for general adult medical examination without abnormal findings: Secondary | ICD-10-CM

## 2011-06-30 LAB — LIPID PANEL
HDL: 57.1 mg/dL (ref >39.00)
Total CHOL/HDL Ratio: 3
Triglycerides: 49 mg/dL (ref 0.0–149.0)
VLDL: 9.8 mg/dL (ref 0.0–40.0)

## 2011-06-30 LAB — BASIC METABOLIC PANEL
CO2: 27 mEq/L (ref 19–32)
Calcium: 9.1 mg/dL (ref 8.4–10.5)
Creatinine, Ser: 0.9 mg/dL (ref 0.4–1.5)
GFR: 116.76 mL/min (ref >60.00)
Glucose, Bld: 98 mg/dL (ref 70–99)
Sodium: 140 mEq/L (ref 135–145)

## 2011-06-30 LAB — CBC WITH DIFFERENTIAL/PLATELET
Eosinophils Relative: 2.1 % (ref 0.0–5.0)
HCT: 41.3 % (ref 39.0–52.0)
Hemoglobin: 13.6 g/dL (ref 13.0–17.0)
Lymphs Abs: 1.9 10*3/uL (ref 0.7–4.0)
MCV: 86.5 fl (ref 78.0–100.0)
Monocytes Absolute: 0.4 10*3/uL (ref 0.1–1.0)
Monocytes Relative: 7.5 % (ref 3.0–12.0)
Neutro Abs: 3.3 10*3/uL (ref 1.4–7.7)
Platelets: 201 10*3/uL (ref 150.0–400.0)
WBC: 5.8 10*3/uL (ref 4.5–10.5)

## 2011-06-30 LAB — HEPATIC FUNCTION PANEL
AST: 23 U/L (ref 0–37)
Albumin: 3.5 g/dL (ref 3.5–5.2)
Alkaline Phosphatase: 47 U/L (ref 39–117)
Total Bilirubin: 0.5 mg/dL (ref 0.3–1.2)

## 2011-07-07 ENCOUNTER — Encounter: Payer: Self-pay | Admitting: Pulmonary Disease

## 2011-07-07 NOTE — Progress Notes (Signed)
Subjective:    Patient ID: [name], male    DOB: 1950-10-07, 61 y.o.   MRN: [id number]  HPI 61 y/o BM here for a follow up visit... he has mult med problems as noted below   ~  May 25, 2009:  feeling well w/o new complaints or concerns... BP at home in the 130-140's/ 80's & tol the Atenolol well... remains on diet, ASA, Metformin> weight down 4# & he'll ret for fasting blood work this week... energy remains good etc...  ~  May 03, 2010:  60mo ROV & his DM is now expertly managed by DrEllison for Endocrinology & his Metformin has been increased to 2tabsBid w/ good control;  He has lost 20# on diet program to 161# today;  BP remains well controlled on meds below;  He denies CP, palpit, SOB, cerebral ischemic symptoms, edema, etc...  Fasting labs done> everything looks good x low testosterone level and he would like to try medication for this now, therefore rec rx w/ ANDROGEL 4pumps daily w/ repeat level in 6weeks (he is encouraged to discuss this prob w/ his endocrinologist as well)...  ~  June 28, 2011:  60mo ROV & [name] is generally stable w/ a few minor somatic complaints> he notes nocturnal leg cramps & we reviewed tonic water, mustard, & [employer] home remedies... Also notes nocturia & we discussed strategies for reducing this problem...     He reports that the Androgel did not help & DrEllison has prescribed Clomiphene; I offered pt a second opinion w/ the Urologists but he wants to try DrEllison's med first & he will let me know... We reviewed prob list, meds, xrays and labs> see below>> CXR 6/13 showed borderline heart size, clear lungs, prior surg right shoulder area... EKG 6/13 showed SBrady, rate58, wnl, NAD... LABS 6/13:  FLP- at goals x LDL=112;  Chems- wnl;  CBC- wnl;  TSH=1.74;  PSA=0.85          Problem List:   Hx of BRONCHITIS (ICD-490) - he smokes an occas cigar... no recent symptoms. ~  CXR 4/08 showed norm size heart, clear lungs, screw in right shoulder... ~   CXR 6/13 showed borderline heart size, clear lungs, prior surg right shoulder area...  HYPERTENSION (ICD-401.9) - on ATENOLOL 50mg /d...  ~  5/11:  BP=142/82 and similar at home... denies HA, fatigue, visual changes, CP, palipit, dizziness, syncope, dyspnea, edema, etc...  ~  4/12:  BP= 124/84 & feeling well- remains largely asymptomatic... ~  6/13:  BP=142/88 & he denies CP, palpit, SOB, edema, etc...  CEREBROVASCULAR DISEASE (ICD-437.9) - on ASA 81mg /d... ~  CDopplers 11/08 w/ mild plaque in CCA, 0-39%bilat ICAstenoses... f/u planned 70yrs. ~  CDopplers 11/10 showed stable mild carotid dis bilat- 1-39% bilat ICA stenoses... f/u 46yrs. ~  4/12:  He denies any cerebral ischemic symptoms;  f/u CDopplers due 11/12... ~  CDopplers 12/12 showed mild plaque in bulbs, normal velocities, 0-39% ICA stenoses...  HYPERCHOLESTEROLEMIA, BORDERLINE (ICD-272.4) - on diet alone... ~  FLP 4/08 showed TChol 191, TG 59, HDL 60, LDL 119... discussed diet + exerc efforts. ~  FLP 9/09 (wt=173#) showed TChol 186, TG 48, HDL 61, LDL 116 ~  FLP 9/10 (wt=175#) showed TChol 191, TG 43, HDL 57, LDL 125 ~  FLP 5/11 (wt=181#) showed TChol 194, TG 39, HDL 51, LDL 135... LDL not at goal needs better diet, wt reduction, or consider meds. ~  FLP 4/12 (wt=161#) showed TChol 170, TG 40, HDL 55, LDL 107.Marland KitchenMarland Kitchen  Great job w/ wt reduction! ~  FLP 6/13 (wt=169#) showed TChol 179, TG 49, HDL 57, LDL 112  DIABETES MELLITUS (ICD-250.00) - on METFORMIN 500mg - 2Bid & ACTOS 15mg /d now per DrEllison... ~  labs 8/08 showed BS=86, HGA1c=6.4.Marland KitchenMarland Kitchen on Metformin500mg /d. ~  labs 3/09 showed BS= 95, HgA1c= 6.9 ~  labs 9/09 showed BS= 123, HgA1c= 6.8.Marland KitchenMarland Kitchen continue same. ~  labs 3/10 showed BS= 85, A1c= 6.9 ~  labs 8/10 showed BS= 95, A1c= 6.5.Marland KitchenMarland Kitchen continue same. ~  labs 5/11 showed BS= 126, A1c= 7.1.Marland KitchenMarland Kitchen Pt requested Endocrine referral. ~  DrEllison increased his Metformin to 2Bid... ~  Labs 5/12 showed BS= 95, A1c=6.3 ~  Labs 5-6/13 showed BS= 98, A1c=  6.3  Colonoscopy 12/04 by DrPatterson was WNL... f/u planned 63yrs.  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 we tried Rx w/ Androgel x34mo but pt stopped it- siting no diff in how he felt & didn't want to continue... we discussed options including shots for Low-T at the Urology center but he declines Rx... also has some ED issues & uses VIAGRA 100mg  Prn. ~  5/12: pt reqested f/u Testosterone level = 193 (350-890) same as prev but now he would like to try Rx again; REC> ANDROGEL PUMP 4 pumps daily & rec repeat testosterone level in 6 weeks, also recommended to discuss this prob w/ his Endocrinologist... ~  Labs 11/12 showed Testos level = 210 on the Androgel, pt told this was not a signif response & rec referred to Endocrine for management... ~  6/13:  Pt noted too be on CLOMIPHENE for his Low-T per DrEllison, he had several questions for me that I could not answer & he is rec to f/u w/ DrEllison or seek 2nd opinion w/ Urology...  ? of FIBROMYALGIA (ICD-729.1) & KNEE PAIN (ICD-719.46) - doing well lately without fatigue, without pain... he uses MOBIC 7.5mg  Prn & VOLTAREN Gel prn...  Health Maintenance - had TETANUS shot 2003;  neg colonoscopy 12/04 by DrPatterson; PSA= all normal (0.68 9/10)...   Past Surgical History  Procedure Date  . Shoulder surgery     Outpatient Encounter Prescriptions as of 06/28/2011  Medication Sig Dispense Refill  . aspirin 81 MG tablet Take 81 mg by mouth daily.        [name] atenolol (TENORMIN) 50 MG tablet TAKE ONE TABLET BY MOUTH EVERY DAY. TO REPLACE BYSTOLIC.  90 tablet  3  . clomiPHENE (CLOMID) 50 MG tablet Take 50 mg by mouth daily.      . diclofenac sodium (VOLTAREN) 1 % GEL Apply 1 application topically 4 (four) times daily.  1 Tube  11  . meloxicam (MOBIC) 7.5 MG tablet Take 1 tablet (7.5 mg total) by mouth daily as needed. For arthritis pain  30  tablet  11  . metFORMIN (GLUCOPHAGE-XR) 500 MG 24 hr tablet Take 2 tablets (1,000 mg total) by mouth 2 (two) times daily.  120 tablet  11  . Multiple Vitamin (MULTIVITAMIN) tablet Take 1 tablet by mouth daily.        . naproxen (NAPROSYN) 375 MG tablet Take 1 tablet (375 mg total) by mouth 2 (two) times daily.  20 tablet  0  . pioglitazone (ACTOS) 15 MG tablet Take 1 tablet (15 mg total) by mouth daily.  90 tablet  0  . sildenafil (VIAGRA) 100 MG tablet Take 100 mg by mouth daily as needed. Use as directed       .  Testosterone (ANDROGEL PUMP) 1.25 GM/ACT (1%) GEL Place 4 drops onto the skin daily.  1 Bottle  5    Allergies  Allergen Reactions  . Azithromycin     REACTION: rash  . Tetracycline     REACTION: rash  . Trandolapril     REACTION: ALLERGIC to ACEs w/ Angioedema    Current Medications, Allergies, Past Medical History, Past Surgical History, Family History, and Social History were reviewed in [employer] record.   Review of Systems         See HPI - all other systems neg except as noted... The patient denies anorexia, fever, weight loss, weight gain, vision loss, decreased hearing, hoarseness, chest pain, syncope, dyspnea on exertion, peripheral edema, prolonged cough, headaches, hemoptysis, abdominal pain, melena, hematochezia, severe indigestion/heartburn, hematuria, incontinence, muscle weakness, suspicious skin lesions, transient blindness, difficulty walking, depression, unusual weight change, abnormal bleeding, enlarged lymph nodes, and angioedema.     Objective:   Physical Exam      WD, WN, 61 y/o BM in NAD... GENERAL:  Alert & oriented; pleasant & cooperative... HEENT:  [place]/AT, EOM-wnl, PERRLA, EACs-clear, TMs-wnl, NOSE-clear, THROAT-clear & wnl NECK:  Supple w/ full ROM; no JVD; normal carotid impulses w/o bruits; no thyromegaly or nodules palpated; no lymphadenopathy. CHEST:  Clear to P & A; without wheezes/ rales/ or rhonchi. HEART:   Regular Rhythm; without murmurs/ rubs/ or gallops. ABDOMEN:  Soft & nontender; normal bowel sounds; no organomegaly or masses detected. (RECTAL:  sm testes, 2-3+ prostate smooth w/o lesions; stool heme neg) EXT: without deformities or arthritic changes; no varicose veins/ venous insuffic/ or edema. NEURO:  CN's intact; motor testing normal; sensory testing normal; gait normal & balance OK. DERM:  No lesions noted; no rash etc...  RADIOLOGY DATA:  Reviewed in the EPIC EMR & discussed w/ the patient...  LABORATORY DATA:  Reviewed in the EPIC EMR & discussed w/ the patient...   Assessment & Plan:   HBP>  Controlled on BBlocker, great job w/ wt reduction, continue same...  Cerebrovasc dis>  Stable, asymptomatic on ASA, f/u CDopplers due 11/12...  CHOL>  Improved w/ wt reduction, keep up the good work...  DM>  Followed by DrEllison now, on Metformin 500mg - 2Bid, continue same...  Testic Hypofunction>  Low-T confirmed on repeat testing, he wants to try the Androgel again, Rx for 4 pumps daily & recheck level in 6weeks;  rec to discuss this w/ his Endocrinologist as well...  Other medical problems as noted>  Labs otherw look good...   Patient's Medications  New Prescriptions   No medications on file  Previous Medications   ASPIRIN 81 MG TABLET    Take 81 mg by mouth daily.     ATENOLOL (TENORMIN) 50 MG TABLET    TAKE ONE TABLET BY MOUTH EVERY DAY. TO REPLACE BYSTOLIC.   CLOMIPHENE (CLOMID) 50 MG TABLET    Take 50 mg by mouth daily.   DICLOFENAC SODIUM (VOLTAREN) 1 % GEL    Apply 1 application topically 4 (four) times daily.   MELOXICAM (MOBIC) 7.5 MG TABLET    Take 1 tablet (7.5 mg total) by mouth daily as needed. For arthritis pain   METFORMIN (GLUCOPHAGE-XR) 500 MG 24 HR TABLET    Take 2 tablets (1,000 mg total) by mouth 2 (two) times daily.   MULTIPLE VITAMIN (MULTIVITAMIN) TABLET    Take 1 tablet by mouth daily.     NAPROXEN (NAPROSYN) 375 MG TABLET    Take 1 tablet (375  mg total)  by mouth 2 (two) times daily.   PIOGLITAZONE (ACTOS) 15 MG TABLET    Take 1 tablet (15 mg total) by mouth daily.   SILDENAFIL (VIAGRA) 100 MG TABLET    Take 100 mg by mouth daily as needed. Use as directed    TESTOSTERONE (ANDROGEL PUMP) 1.25 GM/ACT (1%) GEL    Place 4 drops onto the skin daily.  Modified Medications   No medications on file  Discontinued Medications   No medications on file

## 2011-07-29 ENCOUNTER — Ambulatory Visit (INDEPENDENT_AMBULATORY_CARE_PROVIDER_SITE_OTHER): Payer: BC Managed Care – PPO | Admitting: Endocrinology

## 2011-07-29 ENCOUNTER — Encounter: Payer: Self-pay | Admitting: Endocrinology

## 2011-07-29 ENCOUNTER — Other Ambulatory Visit (INDEPENDENT_AMBULATORY_CARE_PROVIDER_SITE_OTHER): Payer: BC Managed Care – PPO

## 2011-07-29 VITALS — BP 138/82 | HR 62 | Temp 96.6°F | Ht 66.5 in | Wt 172.0 lb

## 2011-07-29 DIAGNOSIS — R609 Edema, unspecified: Secondary | ICD-10-CM

## 2011-07-29 DIAGNOSIS — E291 Testicular hypofunction: Secondary | ICD-10-CM

## 2011-07-29 LAB — TESTOSTERONE: Testosterone: 207.65 ng/dL — ABNORMAL LOW (ref 350.00–890.00)

## 2011-07-29 LAB — BRAIN NATRIURETIC PEPTIDE: Pro B Natriuretic peptide (BNP): 76 pg/mL (ref 0.0–100.0)

## 2011-07-29 NOTE — Progress Notes (Signed)
Subjective:    Patient ID: [name], male    DOB: 01-30-1950, 61 y.o.   MRN: [id number]  HPI Pt states 2 weeks of slight exac of chronic swelling of both ankles, but no assoc pain.  He is unable to cite precip factor.   He stopped clomid 2 weeks ago, as he thought this might be the cause of his sxs.   Past Medical History  Diagnosis Date  . History of bronchitis   . Hypertension   . Cerebrovascular disease, unspecified   . Other and unspecified hyperlipidemia     Hypercholesterolemia, Borderline  . Diabetes mellitus   . Testicular hypofunction   . Impotence of organic origin   . Fibromyalgia   . Knee pain     Past Surgical History  Procedure Date  . Shoulder surgery     History   Social History  . Marital Status: Married    Spouse Name: [name]    Number of Children: 0  . Years of Education: N/A   Occupational History  .     Social History Main Topics  . Smoking status: Former Smoker    Types: Cigarettes, Cigars    Quit date: 01/04/2008  . Smokeless tobacco: Never Used  . Alcohol Use: Yes     socially  . Drug Use: No  . Sexually Active: Not on file   Other Topics Concern  . Not on file   Social History Narrative   Regular exercise-yesCaffeine-2 cupsMarried=wife [name] 22 yearsCigar Smoker-quit smoking Jan 2010    Current Outpatient Prescriptions on File Prior to Visit  Medication Sig Dispense Refill  . aspirin 81 MG tablet Take 81 mg by mouth daily.        [name] atenolol (TENORMIN) 50 MG tablet TAKE ONE TABLET BY MOUTH EVERY DAY. TO REPLACE BYSTOLIC.  90 tablet  3  . diclofenac sodium (VOLTAREN) 1 % GEL Apply 1 application topically 4 (four) times daily.  1 Tube  11  . meloxicam (MOBIC) 7.5 MG tablet Take 1 tablet (7.5 mg total) by mouth daily as needed. For arthritis pain  30 tablet  11  . metFORMIN (GLUCOPHAGE-XR) 500 MG 24 hr tablet Take 2 tablets (1,000 mg total) by mouth 2 (two) times daily.  120 tablet  11  . Multiple Vitamin (MULTIVITAMIN) tablet  Take 1 tablet by mouth daily.        . naproxen (NAPROSYN) 375 MG tablet Take 1 tablet (375 mg total) by mouth 2 (two) times daily.  20 tablet  0  . sildenafil (VIAGRA) 100 MG tablet Take 100 mg by mouth daily as needed. Use as directed         Allergies  Allergen Reactions  . Azithromycin     REACTION: rash  . Tetracycline     REACTION: rash  . Trandolapril     REACTION: ALLERGIC to ACEs w/ Angioedema    Family History  Problem Relation Age of Onset  . Heart disease Mother   . Diabetes Father   . Heart disease Sister     heart trouble  . Diabetes Brother     BP 138/82  Pulse 62  Temp 96.6 F (35.9 C) (Oral)  Ht 5' 6.5" (1.689 m)  Wt 172 lb (78.019 kg)  BMI 27.35 kg/m2  SpO2 99%    Review of Systems Denies sob and chest pain.     Objective:   Physical Exam VITAL SIGNS:  See vs page GENERAL: no distress Pulses: dorsalis pedis intact bilat.  Feet: no deformity.  no ulcer on the feet.  feet are of normal color and temp.  1+ bilat leg edema Neuro: sensation is intact to touch on the feet.  Lab Results  Component Value Date   HGBA1C 6.3 05/27/2011      Assessment & Plan:  Edema, prob due to actos Hypogonadism.  He sopped clomid, but he can prob resume. DM.  Control has been good, so he is ok off the actos for now

## 2011-07-29 NOTE — Patient Instructions (Addendum)
Stop the actos.  This will help the swelling, but it will take time.   blood tests are being requested for you today.  You will receive a letter with results.   Stay-off the clomiphine for now.

## 2011-08-01 ENCOUNTER — Ambulatory Visit: Payer: BC Managed Care – PPO | Admitting: Endocrinology

## 2011-08-01 LAB — URINALYSIS, ROUTINE W REFLEX MICROSCOPIC
Ketones, ur: NEGATIVE
Specific Gravity, Urine: 1.025 (ref 1.000–1.030)
Total Protein, Urine: NEGATIVE
Urine Glucose: NEGATIVE
pH: 5.5 (ref 5.0–8.0)

## 2011-08-02 ENCOUNTER — Other Ambulatory Visit: Payer: Self-pay | Admitting: Pulmonary Disease

## 2011-08-03 ENCOUNTER — Telehealth: Payer: Self-pay | Admitting: *Deleted

## 2011-08-03 ENCOUNTER — Other Ambulatory Visit: Payer: Self-pay | Admitting: Pulmonary Disease

## 2011-08-03 NOTE — Telephone Encounter (Signed)
Called pt to inform of lab results, pt informed (letter also mailed to pt). 

## 2011-08-11 ENCOUNTER — Telehealth: Payer: Self-pay | Admitting: Pulmonary Disease

## 2011-08-11 MED ORDER — FUROSEMIDE 20 MG PO TABS: ORAL_TABLET | ORAL | Status: DC

## 2011-08-11 NOTE — Telephone Encounter (Signed)
lmomtcb x1 on mobile and cell #

## 2011-08-11 NOTE — Telephone Encounter (Signed)
I spoke with pt and is aware of SN recs. I have sent rx into the pharmacy and nothing further was needed

## 2011-08-11 NOTE — Telephone Encounter (Signed)
Per SN--it may take a little longer to get this out of his system.   In the meanwhile,    1.  No salt 2.  Start lasix 20mg   #30   1 by mouth every morning if needed for swelling.    Thanks

## 2011-08-11 NOTE — Telephone Encounter (Signed)
Pt says Dr. Everardo All had started him on Actos before the LE edema started. He has been off of the Actos for approx 2 weeks now and the edema is not getting any better. Pt denies any sob or pain in his legs or calves. Pt says the swelling does go down some overnight but not completely. He says he does sit most of the day at his job. Pt asking for recs from SN. Pls advise. Allergies  Allergen Reactions  . Azithromycin     REACTION: rash  . Tetracycline     REACTION: rash  . Trandolapril     REACTION: ALLERGIC to ACEs w/ Angioedema

## 2011-11-29 ENCOUNTER — Other Ambulatory Visit: Payer: Self-pay | Admitting: *Deleted

## 2012-01-31 ENCOUNTER — Ambulatory Visit (INDEPENDENT_AMBULATORY_CARE_PROVIDER_SITE_OTHER): Payer: BC Managed Care – PPO | Admitting: Endocrinology

## 2012-01-31 ENCOUNTER — Encounter: Payer: Self-pay | Admitting: Endocrinology

## 2012-01-31 VITALS — BP 142/80 | HR 67 | Wt 169.0 lb

## 2012-01-31 DIAGNOSIS — E119 Type 2 diabetes mellitus without complications: Secondary | ICD-10-CM

## 2012-01-31 NOTE — Progress Notes (Signed)
  Subjective:    Patient ID: [name], male    DOB: 01-Sep-1950, 62 y.o.   MRN: [id number]  HPI Pt returns for f/u of type 2 DM (dx'ed 1996; complicated by cerebrovascular disease).  no cbg record, but states cbg's are well-controlled.  He denies weight change.  Edema has resolved off actos.  Past Medical History  Diagnosis Date  . History of bronchitis   . Hypertension   . Cerebrovascular disease, unspecified   . Other and unspecified hyperlipidemia     Hypercholesterolemia, Borderline  . Diabetes mellitus   . Testicular hypofunction   . Impotence of organic origin   . Fibromyalgia   . Knee pain     Past Surgical History  Procedure Date  . Shoulder surgery     History   Social History  . Marital Status: Married    Spouse Name: [name]    Number of Children: 0  . Years of Education: N/A   Occupational History  .     Social History Main Topics  . Smoking status: Former Smoker    Types: Cigarettes, Cigars    Quit date: 01/04/2008  . Smokeless tobacco: Never Used  . Alcohol Use: Yes     Comment: socially  . Drug Use: No  . Sexually Active: Not on file   Other Topics Concern  . Not on file   Social History Narrative   Regular exercise-yesCaffeine-2 cupsMarried=wife [name] 22 yearsCigar Smoker-quit smoking Jan 2010    Current Outpatient Prescriptions on File Prior to Visit  Medication Sig Dispense Refill  . aspirin 81 MG tablet Take 81 mg by mouth daily.        [name] atenolol (TENORMIN) 50 MG tablet       . diclofenac sodium (VOLTAREN) 1 % GEL Apply 1 application topically 4 (four) times daily.  1 Tube  11  . furosemide (LASIX) 20 MG tablet 1 tablet every morning as needed for swelling  30 tablet  0  . meloxicam (MOBIC) 7.5 MG tablet Take 1 tablet (7.5 mg total) by mouth daily as needed. For arthritis pain  30 tablet  11  . metFORMIN (GLUCOPHAGE-XR) 500 MG 24 hr tablet Take 2 tablets (1,000 mg total) by mouth 2 (two) times daily.  120 tablet  11  . Multiple Vitamin  (MULTIVITAMIN) tablet Take 1 tablet by mouth daily.        . sildenafil (VIAGRA) 100 MG tablet Take 100 mg by mouth daily as needed. Use as directed         Allergies  Allergen Reactions  . Actos (Pioglitazone)     edema  . Azithromycin     REACTION: rash  . Tetracycline     REACTION: rash  . Trandolapril     REACTION: ALLERGIC to ACEs w/ Angioedema    Family History  Problem Relation Age of Onset  . Heart disease Mother   . Diabetes Father   . Heart disease Sister     heart trouble  . Diabetes Brother     BP 142/80  Pulse 67  Wt 169 lb (76.658 kg)  SpO2 98%  Review of Systems Denies nausea    Objective:   Physical Exam Pulses: dorsalis pedis intact bilat.   Feet: no deformity.  no ulcer on the feet.  feet are of normal color and temp.  no edema Neuro: sensation is intact to touch on the feet     Assessment & Plan:  DM: apparently well-controlled

## 2012-01-31 NOTE — Patient Instructions (Addendum)
blood tests are being requested for you today.  We'll contact you with results. check your blood sugar once a day.  vary the time of day when you check, between before the 3 meals, and at bedtime.  also check if you have symptoms of your blood sugar being too high or too low.  please keep a record of the readings and bring it to your next appointment here.  please call us sooner if your blood sugar goes below 70, or if you have a lot of readings over 200. Please return in 1 year.

## 2012-02-01 LAB — MICROALBUMIN / CREATININE URINE RATIO
Creatinine, Urine: 219.2 mg/dL
Microalb Creat Ratio: 2.5 mg/g (ref 0.0–30.0)

## 2012-02-07 ENCOUNTER — Telehealth: Payer: Self-pay | Admitting: Endocrinology

## 2012-02-07 NOTE — Telephone Encounter (Signed)
Please call patient with lab results. Home# [id number] or cell# [phone]

## 2012-02-07 NOTE — Telephone Encounter (Signed)
Pt advised of lab results from 01/31/12 and states an uderstanding

## 2012-03-02 ENCOUNTER — Encounter: Payer: Self-pay | Admitting: Pulmonary Disease

## 2012-03-02 ENCOUNTER — Ambulatory Visit (INDEPENDENT_AMBULATORY_CARE_PROVIDER_SITE_OTHER): Payer: BC Managed Care – PPO | Admitting: Pulmonary Disease

## 2012-03-02 VITALS — BP 140/90 | HR 61 | Temp 97.7°F | Ht 67.0 in | Wt 174.6 lb

## 2012-03-02 NOTE — Patient Instructions (Addendum)
Today we updated your med list in our EPIC system...    Continue your current medications the same...    We refilled the meds you requested...  We reviewed your prev lab data etc...  Call for any questions...  Let's plan a follow up Physical in the 2nd half of the yr.Marland KitchenMarland Kitchen

## 2012-03-02 NOTE — Progress Notes (Signed)
Subjective:    Patient ID: [name], male    DOB: Mar 27, 1950, 62 y.o.   MRN: [id number]  HPI 62 y/o BM here for a follow up visit... he has mult med problems as noted below   ~  May 03, 2010:  92mo ROV & his DM is now expertly managed by DrEllison for Endocrinology & his Metformin has been increased to 2tabsBid w/ good control;  He has lost 20# on diet program to 161# today;  BP remains well controlled on meds below;  He denies CP, palpit, SOB, cerebral ischemic symptoms, edema, etc...  Fasting labs done> everything looks good x low testosterone level and he would like to try medication for this now, therefore rec rx w/ ANDROGEL 4pumps daily w/ repeat level in 6weeks (he is encouraged to discuss this prob w/ his endocrinologist as well)...  ~  June 28, 2011:  73mo ROV & [name] is generally stable w/ a few minor somatic complaints> he notes nocturnal leg cramps & we reviewed tonic water, mustard, & [employer] home remedies... Also notes nocturia & we discussed strategies for reducing this problem...     He reports that the Androgel did not help & DrEllison has prescribed Clomiphene; I offered pt a second opinion w/ the Urologists but he wants to try DrEllison's med first & he will let me know... We reviewed prob list, meds, xrays and labs> see below>> CXR 6/13 showed borderline heart size, clear lungs, prior surg right shoulder area... EKG 6/13 showed SBrady, rate58, wnl, NAD... LABS 6/13:  FLP- at goals x LDL=112;  Chems- wnl;  CBC- wnl;  TSH=1.74;  PSA=0.85  ~  March 02, 2012:  34mo ROV & [name] has been doing well- no new complaints or concerns...  We reviewed the following medical problems during today's office visit >>    HxBronchitis> smokes an occas cigar; denies recent symptoms- w/o cough, sput, hemoptysis, SOB, etc...    HBP> on Aten50, Lasix20 prn; BP= 140/90 & he denies CP, palpit, dizzy, SOB, edema...    Cerebrovasc Dis> on ASA81; he denies cerebral ischemic symptoms; last  CDoppler12/12 showed mild plaque, 0-39% bilat ICA stenoses, f/u 89yrs.    Chol> on diet alone; last FLP 6/13 showed TChol 179, TG 49, HDL 57, LDL 112    DM> followed by DrEllison for Endocrine; on MetforminER500-2Bid; edema resolved off Actos; Labs 1/14 showed BS=143, A1c=6.6, Umicroalb=neg...    GU- LowT, ED> followed by [facility] for Urology; on Androgel 1.62% & Viagra100 prn; Testos levels have been low & PSAs wnl...    OA, ?FM> on Mobic7.5 prn; he has some knee & muscle pain... We reviewed prob list, meds, xrays and labs> see below for updates >> he requests refill Viagra today...         Problem List:   Hx of BRONCHITIS (ICD-490) - he smokes an occas cigar... no recent symptoms. ~  CXR 4/08 showed norm size heart, clear lungs, screw in right shoulder... ~  CXR 6/13 showed borderline heart size, clear lungs, prior surg right shoulder area...  HYPERTENSION (ICD-401.9) - on ATENOLOL 50mg /d...  ~  5/11:  BP=142/82 and similar at home... denies HA, fatigue, visual changes, CP, palipit, dizziness, syncope, dyspnea, edema, etc...  ~  4/12:  BP= 124/84 & feeling well- remains largely asymptomatic... ~  6/13:  BP=142/88 & he denies CP, palpit, SOB, edema, etc... ~  2/14:  on Aten50, Lasix20 prn; BP= 140/90 & he denies CP, palpit, dizzy, SOB, edema.  CEREBROVASCULAR DISEASE (  ICD-437.9) - on ASA 81mg /d... ~  CDopplers 11/08 w/ mild plaque in CCA, 0-39%bilat ICAstenoses... f/u planned 32yrs. ~  CDopplers 11/10 showed stable mild carotid dis bilat- 1-39% bilat ICA stenoses... f/u 64yrs. ~  4/12:  He denies any cerebral ischemic symptoms;  f/u CDopplers due 11/12... ~  CDopplers 12/12 showed mild plaque in bulbs, normal velocities, 0-39% ICA stenoses...  HYPERCHOLESTEROLEMIA, BORDERLINE (ICD-272.4) - on diet alone... ~  FLP 4/08 showed TChol 191, TG 59, HDL 60, LDL 119... discussed diet + exerc efforts. ~  FLP 9/09 (wt=173#) showed TChol 186, TG 48, HDL 61, LDL 116 ~  FLP 9/10 (wt=175#) showed TChol  191, TG 43, HDL 57, LDL 125 ~  FLP 5/11 (wt=181#) showed TChol 194, TG 39, HDL 51, LDL 135... LDL not at goal needs better diet, wt reduction, or consider meds. ~  FLP 4/12 (wt=161#) showed TChol 170, TG 40, HDL 55, LDL 107... Great job w/ wt reduction! ~  FLP 6/13 (wt=169#) showed TChol 179, TG 49, HDL 57, LDL 112  DIABETES MELLITUS (ICD-250.00) - on METFORMIN 500mg - 2Bid & off Actos now per DrEllison... ~  labs 8/08 showed BS=86, HGA1c=6.4.Marland KitchenMarland Kitchen on Metformin500mg /d. ~  labs 3/09 showed BS= 95, HgA1c= 6.9 ~  labs 9/09 showed BS= 123, HgA1c= 6.8.Marland KitchenMarland Kitchen continue same. ~  labs 3/10 showed BS= 85, A1c= 6.9 ~  labs 8/10 showed BS= 95, A1c= 6.5.Marland KitchenMarland Kitchen continue same. ~  labs 5/11 showed BS= 126, A1c= 7.1.Marland KitchenMarland Kitchen Pt requested Endocrine referral. ~  DrEllison increased his Metformin to 2Bid... ~  Labs 5/12 showed BS= 95, A1c=6.3 ~  Labs 5-6/13 showed BS= 98, A1c= 6.3 ~  1/14: He had neg Eye exam from DrBrewington- no DM retinopathy... ~  Labs 1/14 on MetformER500-2Bid showed BS=143, A1c=6.6, Umicroalb=neg...  Colonoscopy 12/04 by DrPatterson was WNL... f/u planned 48yrs.  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 we tried Rx w/ Androgel x20mo but pt stopped it- siting no diff in how he felt & didn't want to continue... we discussed options including shots for Low-T at the Urology center but he declines Rx... also has some ED issues & uses VIAGRA 100mg  Prn. ~  5/12: pt reqested f/u Testosterone level = 193 (350-890) same as prev but now he would like to try Rx again; REC> ANDROGEL PUMP 4 pumps daily & rec repeat testosterone level in 6 weeks, also recommended to discuss this prob w/ his Endocrinologist... ~  Labs 11/12 showed Testos level = 210 on the Androgel, pt told this was not a signif response & rec referred to Endocrine for management... ~  6/13:  Pt noted to be on CLOMIPHENE for his Low-T per  DrEllison, he had several questions for me that I could not answer & he is rec to f/u w/ DrEllison or seek 2nd opinion w/ Urology... ~  10/13:  He reports that the Clomiphene did not help & he stopped it; he saw DrNesi at the Urology Center w/ rec to start Androgel 1.62% trial...  ? of FIBROMYALGIA (ICD-729.1) & KNEE PAIN (ICD-719.46) - doing well lately without fatigue, without pain... he uses MOBIC 7.5mg  Prn & VOLTAREN Gel prn...  Health Maintenance >> ~  GI:  Followed by DrPatterson; he had colonoscopy 2004- neg & f/u planned 60yrs... ~  GU:  Followed by Thermon Leyland for LowT & ED; back onandrogel 1.62% and viagra prn... ~  Immuniz:  He gets the yearly seasonal Flu vaccine (  last-10/13);  Last Tetanus booster in 2003- due for f/u TDAP but not given yet...   Past Surgical History  Procedure Laterality Date  . Shoulder surgery      Outpatient Encounter Prescriptions as of 03/02/2012  Medication Sig Dispense Refill  . aspirin 81 MG tablet Take 81 mg by mouth daily.        [name] atenolol (TENORMIN) 50 MG tablet Take 50 mg by mouth daily.       . furosemide (LASIX) 20 MG tablet 1 tablet every morning as needed for swelling  30 tablet  0  . meloxicam (MOBIC) 7.5 MG tablet Take 1 tablet (7.5 mg total) by mouth daily as needed. For arthritis pain  30 tablet  11  . metFORMIN (GLUCOPHAGE-XR) 500 MG 24 hr tablet Take 2 tablets (1,000 mg total) by mouth 2 (two) times daily.  120 tablet  11  . Multiple Vitamin (MULTIVITAMIN) tablet Take 1 tablet by mouth daily.        . sildenafil (VIAGRA) 100 MG tablet Take 100 mg by mouth daily as needed. Use as directed       . Testosterone (ANDROGEL) 20.25 MG/1.25GM (1.62%) GEL Apply to shoulders every morning      . diclofenac sodium (VOLTAREN) 1 % GEL Apply 1 application topically 4 (four) times daily.  1 Tube  11   No facility-administered encounter medications on file as of 03/02/2012.    Allergies  Allergen Reactions  . Actos (Pioglitazone)     edema  .  Azithromycin     REACTION: rash  . Tetracycline     REACTION: rash  . Trandolapril     REACTION: ALLERGIC to ACEs w/ Angioedema    Current Medications, Allergies, Past Medical History, Past Surgical History, Family History, and Social History were reviewed in [employer] record.   Review of Systems         See HPI - all other systems neg except as noted... Wt is up 7# to 175#;  The patient denies anorexia, fever, weight loss, vision loss, decreased hearing, hoarseness, chest pain, syncope, dyspnea on exertion, peripheral edema, prolonged cough, headaches, hemoptysis, abdominal pain, melena, hematochezia, severe indigestion/heartburn, hematuria, incontinence, muscle weakness, suspicious skin lesions, transient blindness, difficulty walking, depression, unusual weight change, abnormal bleeding, enlarged lymph nodes, and angioedema.     Objective:   Physical Exam      WD, WN, 61 y/o BM in NAD... GENERAL:  Alert & oriented; pleasant & cooperative... HEENT:  [place]/AT, EOM-wnl, PERRLA, EACs-clear, TMs-wnl, NOSE-clear, THROAT-clear & wnl NECK:  Supple w/ full ROM; no JVD; normal carotid impulses w/o bruits; no thyromegaly or nodules palpated; no lymphadenopathy. CHEST:  Clear to P & A; without wheezes/ rales/ or rhonchi. HEART:  Regular Rhythm; without murmurs/ rubs/ or gallops. ABDOMEN:  Soft & nontender; normal bowel sounds; no organomegaly or masses detected. (RECTAL:  sm testes, 2-3+ prostate smooth w/o lesions; stool heme neg) EXT: without deformities or arthritic changes; no varicose veins/ venous insuffic/ or edema. NEURO:  CN's intact; motor testing normal; sensory testing normal; gait normal & balance OK. DERM:  No lesions noted; no rash etc...  RADIOLOGY DATA:  Reviewed in the EPIC EMR & discussed w/ the patient...  LABORATORY DATA:  Reviewed in the EPIC EMR & discussed w/ the patient...   Assessment & Plan:    HBP>  Controlled on BBlocker, needs further  wt reduction, continue same med...  Cerebrovasc dis>  Stable, asymptomatic on ASA, f/u CDopplers 12/12 were  stable...  CHOL>  Improved w/ wt reduction, keep up the good work...  DM>  Followed by DrEllison, on Metformin 500mg - 2Bid, continue same...  Testic Hypofunction>  He did not improve w/ Androgel from [place] prev; he saw drEllison w/ Clomiphene Rx & no improvement either; now seeing DrNesi & back on Androgel 1/62%...  Other medical problems as noted>  Labs otherw look good...   Patient's Medications  New Prescriptions   No medications on file  Previous Medications   ASPIRIN 81 MG TABLET    Take 81 mg by mouth daily.     ATENOLOL (TENORMIN) 50 MG TABLET    Take 50 mg by mouth daily.    DICLOFENAC SODIUM (VOLTAREN) 1 % GEL    Apply 1 application topically 4 (four) times daily.   FUROSEMIDE (LASIX) 20 MG TABLET    1 tablet every morning as needed for swelling   MELOXICAM (MOBIC) 7.5 MG TABLET    Take 1 tablet (7.5 mg total) by mouth daily as needed. For arthritis pain   METFORMIN (GLUCOPHAGE-XR) 500 MG 24 HR TABLET    Take 2 tablets (1,000 mg total) by mouth 2 (two) times daily.   MULTIPLE VITAMIN (MULTIVITAMIN) TABLET    Take 1 tablet by mouth daily.     SILDENAFIL (VIAGRA) 100 MG TABLET    Take 100 mg by mouth daily as needed. Use as directed    TESTOSTERONE (ANDROGEL) 20.25 MG/1.25GM (1.62%) GEL    Apply to shoulders every morning  Modified Medications   No medications on file  Discontinued Medications   No medications on file

## 2012-05-29 ENCOUNTER — Other Ambulatory Visit: Payer: Self-pay | Admitting: Endocrinology

## 2012-06-27 ENCOUNTER — Other Ambulatory Visit: Payer: Self-pay

## 2012-06-27 MED ORDER — METFORMIN HCL ER 500 MG PO TB24: ORAL_TABLET | ORAL | Status: DC

## 2012-07-09 ENCOUNTER — Telehealth: Payer: Self-pay | Admitting: Pulmonary Disease

## 2012-07-09 DIAGNOSIS — M25511 Pain in right shoulder: Secondary | ICD-10-CM

## 2012-07-09 NOTE — Telephone Encounter (Signed)
Spoke with pt and advised that we are referring him to Dr Turner Daniels orthopedics.

## 2012-07-09 NOTE — Telephone Encounter (Signed)
Spoke with patient States he has been having pain under right shoulder blade States he has switched out mattresses thinking this may be the cause however pain is still there Patient requesting referral to orthopedic MD Dr. Kriste Basque please advise if this order can be placed  Last OV:03/02/12 6 month f/u not scheduled at this time  Alternate #: [phone] pt states a detailed msg can be left on VM is no answer

## 2012-07-09 NOTE — Telephone Encounter (Signed)
Per SN---  Refer pt to Dr. Turner Daniels ortho for eval of pain in right shoulder blade.  thanks

## 2012-07-23 ENCOUNTER — Telehealth: Payer: Self-pay | Admitting: *Deleted

## 2012-07-23 NOTE — Telephone Encounter (Signed)
Pt called this morning c/o pain in shoulder, radiating down to right arm.   Shanda Bumps called and spoke with pt and he stated that he went to specialist for shoulder pain.  Symptoms worse x 2 wks with numbness radiating down to the elbow and fingers.  Esp hard/difficult to use computer/mouse at work.    Per SN---  Needs to return to the specialist if not better.  prob  needs xray and scans.  If he wants a 2nd opinion, then SN recs Dr. Teressa Senter.  Thanks  Allergies  Allergen Reactions  . Actos (Pioglitazone)     edema  . Azithromycin     REACTION: rash  . Tetracycline     REACTION: rash  . Trandolapril     REACTION: ALLERGIC to ACEs w/ Angioedema

## 2012-07-23 NOTE — Telephone Encounter (Signed)
lmomtcb  

## 2012-07-24 ENCOUNTER — Other Ambulatory Visit: Payer: Self-pay | Admitting: Pulmonary Disease

## 2012-07-25 NOTE — Telephone Encounter (Signed)
Pt advised. Jiles Goya, CMA  

## 2012-07-27 ENCOUNTER — Other Ambulatory Visit: Payer: Self-pay | Admitting: Pulmonary Disease

## 2012-07-27 ENCOUNTER — Other Ambulatory Visit: Payer: Self-pay | Admitting: Endocrinology

## 2012-08-30 ENCOUNTER — Other Ambulatory Visit: Payer: Self-pay | Admitting: Endocrinology

## 2012-09-06 ENCOUNTER — Other Ambulatory Visit: Payer: Self-pay | Admitting: *Deleted

## 2012-09-06 MED ORDER — METFORMIN HCL ER 500 MG PO TB24: ORAL_TABLET | ORAL | Status: DC

## 2012-09-12 ENCOUNTER — Telehealth: Payer: Self-pay | Admitting: Pulmonary Disease

## 2012-09-12 NOTE — Telephone Encounter (Signed)
Called spoke with patient who stated that he remembers having an MRI previously thru an orthopedist around the year 2000 when SN referred him for some shoulder pain that resulted in surgery.  Patient is unable to remember this physician's name and asked if the referral could be located in our records.  Advised pt that our system does not go back quite that far (Epic nor EMR) but that we would be happy to order his paper chart for him so we could to try to find this information.  Also advised patient that I am unsure how long it would take to obtain his paper chart as some are now filed in a warehouse and can take up to 3 days to receive.  Patient stated that he does remember the location of the orthopedist ([facility]) and will try to go to that office to obtain the information he is seeking.  Pt decided to not request his paper chart from here at this time.  Advised pt that if [facility] cannot provide him with his requested info, to please call our office back so that we can try to help.  Pt verbalized his understanding.  Nothing further needed at this time; will sign off.

## 2012-09-27 ENCOUNTER — Telehealth: Payer: Self-pay | Admitting: Pulmonary Disease

## 2012-09-27 DIAGNOSIS — Z0000 Encounter for general adult medical examination without abnormal findings: Secondary | ICD-10-CM

## 2012-09-27 NOTE — Telephone Encounter (Signed)
Please advise SN what labs pt will need? thanks

## 2012-09-28 NOTE — Telephone Encounter (Signed)
Called and spoke with pt and lmom to make him aware of labs in the computer for the week of oct 6.

## 2012-10-08 ENCOUNTER — Telehealth: Payer: Self-pay | Admitting: Pulmonary Disease

## 2012-10-08 NOTE — Telephone Encounter (Signed)
I do not see where pt was called. Please advise Leigh if this is one of pt's r/s from SN schedule thanks

## 2012-10-08 NOTE — Telephone Encounter (Signed)
Called and spoke with pt and he is aware of appt on 10/17 that we have rescheduled.  Moved appt to 10/30 and pt is aware.  Pt stated that he will come in a little early to have fasting labs done.  Nothing further is needed.

## 2012-10-10 ENCOUNTER — Encounter: Payer: Self-pay | Admitting: Endocrinology

## 2012-10-10 ENCOUNTER — Ambulatory Visit (INDEPENDENT_AMBULATORY_CARE_PROVIDER_SITE_OTHER): Payer: BC Managed Care – PPO | Admitting: Endocrinology

## 2012-10-10 VITALS — BP 136/80 | HR 67 | Ht 65.0 in | Wt 172.0 lb

## 2012-10-10 DIAGNOSIS — E119 Type 2 diabetes mellitus without complications: Secondary | ICD-10-CM

## 2012-10-10 DIAGNOSIS — Z23 Encounter for immunization: Secondary | ICD-10-CM

## 2012-10-10 LAB — HEMOGLOBIN A1C: Hgb A1c MFr Bld: 7.5 % — ABNORMAL HIGH (ref 4.6–6.5)

## 2012-10-10 MED ORDER — SITAGLIPTIN PHOSPHATE 100 MG PO TABS: 100.0000 mg | ORAL_TABLET | Freq: Every day | ORAL | Status: DC

## 2012-10-10 NOTE — Progress Notes (Signed)
Subjective:    Patient ID: [name], male    DOB: 1950-06-29, 62 y.o.   MRN: [id number]  HPI Pt returns for f/u of type 2 DM (dx'ed 1996; complicated by cerebrovascular disease; he did not tolerate actos (edema)).  no cbg record, but states cbg's are well-controlled. Last week, he had a steroid injection into the neck. Past Medical History  Diagnosis Date  . History of bronchitis   . Hypertension   . Cerebrovascular disease, unspecified   . Other and unspecified hyperlipidemia     Hypercholesterolemia, Borderline  . Diabetes mellitus   . Testicular hypofunction   . Impotence of organic origin   . Fibromyalgia   . Knee pain     Past Surgical History  Procedure Laterality Date  . Shoulder surgery      History   Social History  . Marital Status: Married    Spouse Name: [name]    Number of Children: 0  . Years of Education: N/A   Occupational History  .     Social History Main Topics  . Smoking status: Former Smoker    Types: Cigarettes, Cigars    Quit date: 01/04/2008  . Smokeless tobacco: Never Used  . Alcohol Use: Yes     Comment: socially  . Drug Use: No  . Sexual Activity: Not on file   Other Topics Concern  . Not on file   Social History Narrative   Regular exercise-yes   Caffeine-2 cups   Married=wife [name] 22 years   Cigar Smoker-quit smoking Jan 2010    Current Outpatient Prescriptions on File Prior to Visit  Medication Sig Dispense Refill  . aspirin 81 MG tablet Take 81 mg by mouth daily.        [name] atenolol (TENORMIN) 50 MG tablet TAKE ONE TABLET BY MOUTH EVERY DAY  REPLACES BYSTOLIC   90 tablet  1  . diclofenac sodium (VOLTAREN) 1 % GEL Apply 1 application topically 4 (four) times daily.  1 Tube  11  . furosemide (LASIX) 20 MG tablet 1 tablet every morning as needed for swelling  30 tablet  0  . meloxicam (MOBIC) 7.5 MG tablet TAKE ONE TABLET BY MOUTH EVERY DAY AS NEEDED FOR  ARTHRITIS  PAIN  30 tablet  6  . metFORMIN (GLUCOPHAGE-XR) 500 MG 24  hr tablet TAKE TWO TABLETS BY MOUTH TWICE DAILY  120 tablet  0  . Multiple Vitamin (MULTIVITAMIN) tablet Take 1 tablet by mouth daily.        . sildenafil (VIAGRA) 100 MG tablet Take 100 mg by mouth daily as needed. Use as directed       . Testosterone (ANDROGEL) 20.25 MG/1.25GM (1.62%) GEL Apply to shoulders every morning       No current facility-administered medications on file prior to visit.    Allergies  Allergen Reactions  . Actos [Pioglitazone]     edema  . Azithromycin     REACTION: rash  . Tetracycline     REACTION: rash  . Trandolapril     REACTION: ALLERGIC to ACEs w/ Angioedema    Family History  Problem Relation Age of Onset  . Heart disease Mother   . Diabetes Father   . Heart disease Sister     heart trouble  . Diabetes Brother     BP 136/80  Pulse 67  Ht 5\' 5"  (1.651 m)  Wt 172 lb (78.019 kg)  BMI 28.62 kg/m2  SpO2 99%  Review of Systems denies hypoglycemia and  weight change    Objective:   Physical Exam VITAL SIGNS:  See vs page GENERAL: no distress  Lab Results  Component Value Date   HGBA1C 7.5 10/10/2012      Assessment & Plan:  DM: he needs increased rx.  We discussed the nine oral agents available for type 2 diabetes.  This regimen gives the best risk-benefit ratio.   Neck pain: steroid injections complicate the rx of DM.

## 2012-10-10 NOTE — Patient Instructions (Addendum)
blood tests are being requested for you today.  We'll contact you with results. If it is high, we'll add "januvia." check your blood sugar once a day.  vary the time of day when you check, between before the 3 meals, and at bedtime.  also check if you have symptoms of your blood sugar being too high or too low.  please keep a record of the readings and bring it to your next appointment here.  please call us sooner if your blood sugar goes below 70, or if you have a lot of readings over 200. Please come back for a follow-up appointment in 6 months.

## 2012-10-11 ENCOUNTER — Other Ambulatory Visit: Payer: Self-pay

## 2012-10-11 DIAGNOSIS — Z23 Encounter for immunization: Secondary | ICD-10-CM | POA: Insufficient documentation

## 2012-10-11 MED ORDER — ATENOLOL 50 MG PO TABS: ORAL_TABLET | ORAL | Status: DC

## 2012-10-11 MED ORDER — SITAGLIPTIN PHOSPHATE 100 MG PO TABS: 100.0000 mg | ORAL_TABLET | Freq: Every day | ORAL | Status: DC

## 2012-10-19 ENCOUNTER — Ambulatory Visit: Payer: BC Managed Care – PPO | Admitting: Pulmonary Disease

## 2012-10-23 ENCOUNTER — Other Ambulatory Visit: Payer: Self-pay | Admitting: Endocrinology

## 2012-10-29 ENCOUNTER — Ambulatory Visit (INDEPENDENT_AMBULATORY_CARE_PROVIDER_SITE_OTHER): Payer: BC Managed Care – PPO

## 2012-10-29 DIAGNOSIS — Z0000 Encounter for general adult medical examination without abnormal findings: Secondary | ICD-10-CM

## 2012-10-29 LAB — HEPATIC FUNCTION PANEL
ALT: 31 U/L (ref 0–53)
AST: 26 U/L (ref 0–37)
Alkaline Phosphatase: 61 U/L (ref 39–117)
Bilirubin, Direct: 0.1 mg/dL (ref 0.0–0.3)
Total Bilirubin: 0.4 mg/dL (ref 0.3–1.2)

## 2012-10-29 LAB — BASIC METABOLIC PANEL
BUN: 17 mg/dL (ref 6–23)
Calcium: 8.9 mg/dL (ref 8.4–10.5)
Creatinine, Ser: 1 mg/dL (ref 0.4–1.5)
GFR: 97.67 mL/min (ref >60.00)
Potassium: 3.8 mEq/L (ref 3.5–5.1)

## 2012-10-29 LAB — CBC WITH DIFFERENTIAL/PLATELET
Basophils Relative: 0.5 % (ref 0.0–3.0)
Eosinophils Absolute: 0.2 10*3/uL (ref 0.0–0.7)
Eosinophils Relative: 2.9 % (ref 0.0–5.0)
HCT: 40.7 % (ref 39.0–52.0)
Hemoglobin: 13.4 g/dL (ref 13.0–17.0)
Lymphs Abs: 2.2 10*3/uL (ref 0.7–4.0)
MCHC: 33 g/dL (ref 30.0–36.0)
MCV: 80.9 fl (ref 78.0–100.0)
Monocytes Absolute: 0.5 10*3/uL (ref 0.1–1.0)
Neutro Abs: 3.7 10*3/uL (ref 1.4–7.7)
Neutrophils Relative %: 55.9 % (ref 43.0–77.0)
RBC: 5.03 Mil/uL (ref 4.22–5.81)
WBC: 6.7 10*3/uL (ref 4.5–10.5)

## 2012-10-29 LAB — TSH: TSH: 1.62 u[IU]/mL (ref 0.35–5.50)

## 2012-10-29 LAB — LIPID PANEL
Cholesterol: 218 mg/dL — ABNORMAL HIGH (ref 0–200)
HDL: 63.7 mg/dL (ref >39.00)
Triglycerides: 59 mg/dL (ref 0.0–149.0)
VLDL: 11.8 mg/dL (ref 0.0–40.0)

## 2012-11-01 ENCOUNTER — Ambulatory Visit (INDEPENDENT_AMBULATORY_CARE_PROVIDER_SITE_OTHER): Payer: BC Managed Care – PPO | Admitting: Pulmonary Disease

## 2012-11-01 ENCOUNTER — Encounter: Payer: Self-pay | Admitting: Pulmonary Disease

## 2012-11-01 DIAGNOSIS — I10 Essential (primary) hypertension: Secondary | ICD-10-CM

## 2012-11-01 DIAGNOSIS — IMO0001 Reserved for inherently not codable concepts without codable children: Secondary | ICD-10-CM

## 2012-11-01 DIAGNOSIS — E291 Testicular hypofunction: Secondary | ICD-10-CM

## 2012-11-01 DIAGNOSIS — E785 Hyperlipidemia, unspecified: Secondary | ICD-10-CM

## 2012-11-01 DIAGNOSIS — M25569 Pain in unspecified knee: Secondary | ICD-10-CM

## 2012-11-01 DIAGNOSIS — I679 Cerebrovascular disease, unspecified: Secondary | ICD-10-CM

## 2012-11-01 DIAGNOSIS — E119 Type 2 diabetes mellitus without complications: Secondary | ICD-10-CM

## 2012-11-01 DIAGNOSIS — N529 Male erectile dysfunction, unspecified: Secondary | ICD-10-CM

## 2012-11-01 NOTE — Progress Notes (Signed)
Subjective:    Patient ID: [name], male    DOB: 09-04-1950, 62 y.o.   MRN: [id number]  HPI 62 y/o BM here for a follow up visit... he has mult med problems as noted below   ~  May 03, 2010:  51mo ROV & his DM is now expertly managed by DrEllison for Endocrinology & his Metformin has been increased to 2tabsBid w/ good control;  He has lost 20# on diet program to 161# today;  BP remains well controlled on meds below;  He denies CP, palpit, SOB, cerebral ischemic symptoms, edema, etc...  Fasting labs done> everything looks good x low testosterone level and he would like to try medication for this now, therefore rec rx w/ ANDROGEL 4pumps daily w/ repeat level in 6weeks (he is encouraged to discuss this prob w/ his endocrinologist as well)...  ~  June 28, 2011:  63mo ROV & [name] is generally stable w/ a few minor somatic complaints> he notes nocturnal leg cramps & we reviewed tonic water, mustard, & [employer] home remedies... Also notes nocturia & we discussed strategies for reducing this problem...     He reports that the Androgel did not help & DrEllison has prescribed Clomiphene; I offered pt a second opinion w/ the Urologists but he wants to try DrEllison's med first & he will let me know... We reviewed prob list, meds, xrays and labs> see below>> CXR 6/13 showed borderline heart size, clear lungs, prior surg right shoulder area... EKG 6/13 showed SBrady, rate58, wnl, NAD... LABS 6/13:  FLP- at goals x LDL=112;  Chems- wnl;  CBC- wnl;  TSH=1.74;  PSA=0.85  ~  March 02, 2012:  57mo ROV & [name] has been doing well- no new complaints or concerns...  We reviewed the following medical problems during today's office visit >>    HxBronchitis> smokes an occas cigar; denies recent symptoms- w/o cough, sput, hemoptysis, SOB, etc...    HBP> on Aten50, Lasix20 prn; BP= 140/90 & he denies CP, palpit, dizzy, SOB, edema...    Cerebrovasc Dis> on ASA81; he denies cerebral ischemic symptoms; last  CDoppler12/12 showed mild plaque, 0-39% bilat ICA stenoses, f/u 17yrs.    Chol> on diet alone; last FLP 6/13 showed TChol 179, TG 49, HDL 57, LDL 112    DM> followed by DrEllison for Endocrine; on MetforminER500-2Bid; edema resolved off Actos; Labs 1/14 showed BS=143, A1c=6.6, Umicroalb=neg...    GU- LowT, ED> followed by [facility] for Urology; on Androgel 1.62% & Viagra100 prn; Testos levels have been low & PSAs wnl...    OA, ?FM> on Mobic7.5 prn; he has some knee & muscle pain... We reviewed prob list, meds, xrays and labs> see below for updates >> he requests refill Viagra today...  ~  November 01, 2012:  57mo ROV & [name] reports a pinched nerve in his neck w/ eval & shot by DrWang and improving;  We reviewed the following medical problems during today's office visit >>      HxBronchitis> smokes an occas cigar; denies recent symptoms- w/o cough, sput, hemoptysis, SOB, etc...    HBP> on Aten50, Lasix20 prn; BP= 144/92 & he denies CP, palpit, dizzy, SOB, edema; he is encouraged to take the Lasix20 daily for BP...    Cerebrovasc Dis> on ASA81; he denies cerebral ischemic symptoms; last CDoppler 12/12 showed mild plaque, 0-39% bilat ICA stenoses, f/u 31yrs due soon...    Chol> on diet alone; FLP 10/14 shows TChol 218, TG 59, HDL 64, LDL 147; doesn't  want meds & we reviewed diet & exercise...    DM> followed by DrEllison for Endocrine; on MetforminER500-2Bid & Januvia100 recently started; edema resolved off Actos; Labs 10/14 showed BS=169, A1c=7.7, Umicroalb=neg...    GI> last colonoscopy was 12/04 by DrPatterson;  Due for f/u colon & we will refer chart to GI...    GU- LowT, ED> [facility]; on Androgel 1.62% & Viagra100 prn; Testos levels have been low & PSAs wnl...    OA, ?FM> on Mobic7.5, Neurontin300Bid, Voltaren gel; ; he has some knee & muscle pain... We reviewed prob list, meds, xrays and labs> see below for updates >> he's already received the 2014 Flu vaccine...  LABS 10/14:   FLP- not at goals on diet alone;  Chems- wnl x BS=169 & A1c=7.7;  CBC- wnl;  TSH=1.62...          Problem List:   Hx of BRONCHITIS (ICD-490) - he smokes an occas cigar... no recent symptoms. ~  CXR 4/08 showed norm size heart, clear lungs, screw in right shoulder... ~  CXR 6/13 showed borderline heart size, clear lungs, prior surg right shoulder area...  HYPERTENSION (ICD-401.9) - on ATENOLOL 50mg /d...  ~  5/11:  BP=142/82 and similar at home... denies HA, fatigue, visual changes, CP, palipit, dizziness, syncope, dyspnea, edema, etc...  ~  4/12:  BP= 124/84 & feeling well- remains largely asymptomatic... ~  6/13:  BP=142/88 & he denies CP, palpit, SOB, edema, etc... ~  2/14:  on Aten50, Lasix20 prn; BP= 140/90 & he denies CP, palpit, dizzy, SOB, edema. ~  10/14: on Aten50, Lasix20 prn; BP= 144/92 & he remains essentially asymptomatic; he is encouraged to take the Lasix20 daily for BP.  CEREBROVASCULAR DISEASE (ICD-437.9) - on ASA 81mg /d... ~  CDopplers 11/08 w/ mild plaque in CCA, 0-39%bilat ICAstenoses... f/u planned 25yrs. ~  CDopplers 11/10 showed stable mild carotid dis bilat- 1-39% bilat ICA stenoses... f/u 51yrs. ~  4/12:  He denies any cerebral ischemic symptoms;  f/u CDopplers due 11/12... ~  CDopplers 12/12 showed mild plaque in bulbs, normal velocities, 0-39% ICA stenoses; f/u 10yrs...  HYPERCHOLESTEROLEMIA, BORDERLINE (ICD-272.4) - on diet alone... ~  FLP 4/08 showed TChol 191, TG 59, HDL 60, LDL 119... discussed diet + exerc efforts. ~  FLP 9/09 (wt=173#) showed TChol 186, TG 48, HDL 61, LDL 116 ~  FLP 9/10 (wt=175#) showed TChol 191, TG 43, HDL 57, LDL 125 ~  FLP 5/11 (wt=181#) showed TChol 194, TG 39, HDL 51, LDL 135... LDL not at goal needs better diet, wt reduction, or consider meds. ~  FLP 4/12 (wt=161#) showed TChol 170, TG 40, HDL 55, LDL 107... Great job w/ wt reduction! ~  FLP 6/13 (wt=169#) showed TChol 179, TG 49, HDL 57, LDL 112 ~  FLP 10/14 on diet alone (wt=178#)  showed TChol 218, TG 59, HDL 64, LDL 147... He does not want meds, needs better diet.  DIABETES MELLITUS (ICD-250.00) >. Under the care of DrEllison for endocrine... ~  labs 8/08 showed BS=86, HGA1c=6.4.Marland KitchenMarland Kitchen on Metformin500mg /d. ~  labs 3/09 showed BS= 95, HgA1c= 6.9 ~  labs 9/09 showed BS= 123, HgA1c= 6.8.Marland KitchenMarland Kitchen continue same. ~  labs 3/10 showed BS= 85, A1c= 6.9 ~  labs 8/10 showed BS= 95, A1c= 6.5.Marland KitchenMarland Kitchen continue same. ~  labs 5/11 showed BS= 126, A1c= 7.1.Marland KitchenMarland Kitchen Pt requested Endocrine referral. ~  DrEllison increased his Metformin to 2Bid... ~  Labs 5/12 showed BS= 95, A1c=6.3 ~  Labs 5-6/13 showed BS= 98, A1c=  6.3 ~  1/14: He had neg Eye exam from DrBrewington- no DM retinopathy... ~  Labs 1/14 on MetformER500-2Bid showed BS=143, A1c=6.6, Umicroalb=neg... ~  Intol to Actos w/ edema... ~  Labs 10/14 on Metform500-2Bid & Januvia100 started 10/14 showed BS=169, A1c=7.7  Colonoscopy 12/04 by DrPatterson was WNL... f/u planned 65yrs & we will refer chart to GI...  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 we tried Rx w/ Androgel x68mo but pt stopped it- siting no diff in how he felt & didn't want to continue... we discussed options including shots for Low-T at the Urology center but he declines Rx... also has some ED issues & uses VIAGRA 100mg  Prn. ~  5/12: pt reqested f/u Testosterone level = 193 (350-890) same as prev but now he would like to try Rx again; REC> ANDROGEL PUMP 4 pumps daily & rec repeat testosterone level in 6 weeks, also recommended to discuss this prob w/ his Endocrinologist... ~  Labs 11/12 showed Testos level = 210 on the Androgel, pt told this was not a signif response & rec referred to Endocrine for management... ~  6/13:  Pt noted to be on CLOMIPHENE for his Low-T per DrEllison, he had several questions for me that I could not answer & he is rec to f/u w/ DrEllison or seek  2nd opinion w/ Urology... ~  10/13:  He reports that the Clomiphene did not help & he stopped it; he saw DrNesi at the Urology Center w/ rec to start Androgel 1.62% trial... ~  10/14:  He remains on Androgel & Viagra per DrNesi> feeling well w/ good strength & energy...   ? of FIBROMYALGIA (ICD-729.1) & KNEE PAIN (ICD-719.46) - doing well lately without fatigue, without pain... he uses MOBIC 7.5mg  Prn & VOLTAREN Gel prn...  Health Maintenance >> ~  GI:  Followed by DrPatterson; he had colonoscopy 2004- neg & f/u planned 100yrs... ~  GU:  Followed by Ther[facility] Leyland for LowT & ED; back onandrogel 1.62% and viagra prn... ~  Immuniz:  He gets the yearly seasonal Flu vaccine (last-10/14);  Last Tetanus booster in 2003- due for f/u TDAP but not given yet...   Past Surgical History  Procedure Laterality Date  . Shoulder surgery      Outpatient Encounter Prescriptions as of 11/01/2012  Medication Sig  . atenolol (TENORMIN) 50 MG tablet Take one tablet by mouth every day  REPLACES BYSTOLIC  . furosemide (LASIX) 20 MG tablet 1 tablet every morning as needed for swelling  . gabapentin (NEURONTIN) 300 MG capsule Take 300 mg by mouth 2 (two) times daily.  . meloxicam (MOBIC) 7.5 MG tablet TAKE ONE TABLET BY MOUTH EVERY DAY AS NEEDED FOR  ARTHRITIS  PAIN  . metFORMIN (GLUCOPHAGE-XR) 500 MG 24 hr tablet TAKE TWO TABLETS BY MOUTH TWICE DAILY  . Multiple Vitamin (MULTIVITAMIN) tablet Take 1 tablet by mouth daily.    . sitaGLIPtin (JANUVIA) 100 MG tablet Take 1 tablet (100 mg total) by mouth daily.  . Testosterone (ANDROGEL) 20.25 MG/1.25GM (1.62%) GEL Apply to shoulders every morning  . aspirin 81 MG tablet Take 81 mg by mouth daily.    . sildenafil (VIAGRA) 100 MG tablet Take 100 mg by mouth daily as needed. Use as directed   . [DISCONTINUED] diclofenac sodium (VOLTAREN) 1 % GEL Apply 1 application topically 4 (four) times daily.    Allergies  Allergen Reactions  . Actos [Pioglitazone]  edema  .  Azithromycin     REACTION: rash  . Tetracycline     REACTION: rash  . Trandolapril     REACTION: ALLERGIC to ACEs w/ Angioedema    Current Medications, Allergies, Past Medical History, Past Surgical History, Family History, and Social History were reviewed in [employer] record.   Review of Systems         See HPI - all other systems neg except as noted... Wt is up 7# to 175#;  The patient denies anorexia, fever, weight loss, vision loss, decreased hearing, hoarseness, chest pain, syncope, dyspnea on exertion, peripheral edema, prolonged cough, headaches, hemoptysis, abdominal pain, melena, hematochezia, severe indigestion/heartburn, hematuria, incontinence, muscle weakness, suspicious skin lesions, transient blindness, difficulty walking, depression, unusual weight change, abnormal bleeding, enlarged lymph nodes, and angioedema.     Objective:   Physical Exam      WD, WN, 62 y/o BM in NAD... GENERAL:  Alert & oriented; pleasant & cooperative... HEENT:  [place]/AT, EOM-wnl, PERRLA, EACs-clear, TMs-wnl, NOSE-clear, THROAT-clear & wnl NECK:  Supple w/ full ROM; no JVD; normal carotid impulses w/o bruits; no thyromegaly or nodules palpated; no lymphadenopathy. CHEST:  Clear to P & A; without wheezes/ rales/ or rhonchi. HEART:  Regular Rhythm; without murmurs/ rubs/ or gallops. ABDOMEN:  Soft & nontender; normal bowel sounds; no organomegaly or masses detected. (RECTAL:  sm testes, 2-3+ prostate smooth w/o lesions; stool heme neg) EXT: without deformities or arthritic changes; no varicose veins/ venous insuffic/ or edema. NEURO:  CN's intact; motor testing normal; sensory testing normal; gait normal & balance OK. DERM:  No lesions noted; no rash etc...  RADIOLOGY DATA:  Reviewed in the EPIC EMR & discussed w/ the patient...  LABORATORY DATA:  Reviewed in the EPIC EMR & discussed w/ the patient...   Assessment & Plan:    HBP>  Controlled on BBlocker & Lasix20 prn,  needs further wt reduction, continue same med...  Cerebrovasc dis>  Stable, asymptomatic on ASA, f/u CDopplers 12/12 were stable & f/u due 12/14...  CHOL>  FLP not quite as good; reminded to get on diet, exercise, etc...  DM>  Followed by DrEllison, on Metformin 500mg - 2Bid, & Januvia100 recently added...  Testic Hypofunction>  He did not improve w/ Androgel from [place] prev; he saw DrEllison w/ Clomiphene Rx & no improvement either; now seeing DrNesi & back on Androgel 1/62%...  Other medical problems as noted>  Labs otherw look good...   Patient's Medications  New Prescriptions   No medications on file  Previous Medications   ASPIRIN 81 MG TABLET    Take 81 mg by mouth daily.     ATENOLOL (TENORMIN) 50 MG TABLET    Take one tablet by mouth every day  REPLACES BYSTOLIC   FUROSEMIDE (LASIX) 20 MG TABLET    1 tablet every morning as needed for swelling   GABAPENTIN (NEURONTIN) 300 MG CAPSULE    Take 300 mg by mouth 2 (two) times daily.   MELOXICAM (MOBIC) 7.5 MG TABLET    TAKE ONE TABLET BY MOUTH EVERY DAY AS NEEDED FOR  ARTHRITIS  PAIN   METFORMIN (GLUCOPHAGE-XR) 500 MG 24 HR TABLET    TAKE TWO TABLETS BY MOUTH TWICE DAILY   MULTIPLE VITAMIN (MULTIVITAMIN) TABLET    Take 1 tablet by mouth daily.     SILDENAFIL (VIAGRA) 100 MG TABLET    Take 100 mg by mouth daily as needed. Use as directed    SITAGLIPTIN (JANUVIA) 100 MG  TABLET    Take 1 tablet (100 mg total) by mouth daily.   TESTOSTERONE (ANDROGEL) 20.25 MG/1.25GM (1.62%) GEL    Apply to shoulders every morning  Modified Medications   No medications on file  Discontinued Medications   DICLOFENAC SODIUM (VOLTAREN) 1 % GEL    Apply 1 application topically 4 (four) times daily.

## 2012-11-19 ENCOUNTER — Other Ambulatory Visit: Payer: Self-pay | Admitting: *Deleted

## 2012-11-19 MED ORDER — METFORMIN HCL ER 500 MG PO TB24: ORAL_TABLET | ORAL | Status: DC

## 2012-11-19 NOTE — Telephone Encounter (Signed)
Returned pt phone call.  Requested [facility] ref[facility] to [facility].  [facility].

## 2012-12-19 ENCOUNTER — Other Ambulatory Visit: Payer: Self-pay | Admitting: Endocrinology

## 2012-12-31 ENCOUNTER — Telehealth: Payer: Self-pay

## 2012-12-31 MED ORDER — METFORMIN HCL ER 500 MG PO TB24: ORAL_TABLET | ORAL | Status: DC

## 2012-12-31 NOTE — Telephone Encounter (Signed)
Met forming refilled to home delivery pharmacy.

## 2013-01-08 ENCOUNTER — Telehealth: Payer: Self-pay

## 2013-01-08 MED ORDER — BROMOCRIPTINE MESYLATE 2.5 MG PO TABS: 1.2500 mg | ORAL_TABLET | Freq: Every day | ORAL | Status: DC

## 2013-01-08 NOTE — Telephone Encounter (Signed)
Pt called stating he is is need of a refill for Januvia, however pt wanted to know if a cheaper drug could be given to take its place.  Please advise,  Thanks!

## 2013-01-08 NOTE — Telephone Encounter (Signed)
Ok, i have sent a prescription to your pharmacy, to change januvia to "bromocriptine."  It has possible side effects of nausea and dizziness.  These go away with time.  You can avoid these by taking it at bedtime. i'll see you next time.

## 2013-01-09 NOTE — Telephone Encounter (Signed)
Pt informed change.

## 2013-01-15 ENCOUNTER — Telehealth: Payer: Self-pay | Admitting: Pulmonary Disease

## 2013-01-15 DIAGNOSIS — E785 Hyperlipidemia, unspecified: Secondary | ICD-10-CM

## 2013-01-15 NOTE — Telephone Encounter (Signed)
Called and spoke with pt and he is aware of order that has been placed to set up routine colon.  Nothing further is needed.

## 2013-01-15 NOTE — Telephone Encounter (Signed)
Returning call can be reached at  (w) [phone] or (c) [phone].Elnita Maxwell

## 2013-01-15 NOTE — Telephone Encounter (Signed)
lmomtcb x1 

## 2013-01-17 ENCOUNTER — Encounter: Payer: Self-pay | Admitting: Internal Medicine

## 2013-01-17 ENCOUNTER — Other Ambulatory Visit: Payer: Self-pay | Admitting: Pulmonary Disease

## 2013-02-04 ENCOUNTER — Ambulatory Visit (INDEPENDENT_AMBULATORY_CARE_PROVIDER_SITE_OTHER): Payer: BC Managed Care – PPO | Admitting: Physician Assistant

## 2013-02-04 VITALS — BP 158/90 | HR 100 | Temp 100.4°F | Resp 20 | Ht 66.0 in | Wt 168.0 lb

## 2013-02-04 DIAGNOSIS — A088 Other specified intestinal infections: Secondary | ICD-10-CM

## 2013-02-04 DIAGNOSIS — R1110 Vomiting, unspecified: Secondary | ICD-10-CM

## 2013-02-04 DIAGNOSIS — R509 Fever, unspecified: Secondary | ICD-10-CM

## 2013-02-04 DIAGNOSIS — A084 Viral intestinal infection, unspecified: Secondary | ICD-10-CM

## 2013-02-04 DIAGNOSIS — R300 Dysuria: Secondary | ICD-10-CM

## 2013-02-04 LAB — POCT URINALYSIS DIPSTICK
Bilirubin, UA: NEGATIVE
Blood, UA: NEGATIVE
Glucose, UA: NEGATIVE
Ketones, UA: 40
Leukocytes, UA: NEGATIVE
Nitrite, UA: NEGATIVE
Protein, UA: NEGATIVE
Spec Grav, UA: 1.02
Urobilinogen, UA: 0.2
pH, UA: 5.5

## 2013-02-04 LAB — POCT CBC
Granulocyte percent: 87.5 %G — AB (ref 37–80)
HCT, POC: 49.2 % (ref 43.5–53.7)
Hemoglobin: 15.5 g/dL (ref 14.1–18.1)
Lymph, poc: 0.7 (ref 0.6–3.4)
MCH, POC: 26.5 pg — AB (ref 27–31.2)
MCHC: 31.5 g/dL — AB (ref 31.8–35.4)
MCV: 84.3 fL (ref 80–97)
MID (cbc): 0.2 (ref 0–0.9)
MPV: 9.2 fL (ref 0–99.8)
POC Granulocyte: 6.8 (ref 2–6.9)
POC LYMPH PERCENT: 9.4 %L — AB (ref 10–50)
POC MID %: 3.1 %M (ref 0–12)
Platelet Count, POC: 279 10*3/uL (ref 142–424)
RBC: 5.84 M/uL (ref 4.69–6.13)
RDW, POC: 16.1 %
WBC: 7.8 10*3/uL (ref 4.6–10.2)

## 2013-02-04 LAB — POCT UA - MICROSCOPIC ONLY
Bacteria, U Microscopic: NEGATIVE
Casts, Ur, LPF, POC: NEGATIVE
Crystals, Ur, HPF, POC: NEGATIVE
Epithelial cells, urine per micros: NEGATIVE
Mucus, UA: NEGATIVE
RBC, urine, microscopic: NEGATIVE
WBC, Ur, HPF, POC: NEGATIVE
Yeast, UA: NEGATIVE

## 2013-02-04 LAB — POCT INFLUENZA A/B
Influenza A, POC: NEGATIVE
Influenza B, POC: NEGATIVE

## 2013-02-04 MED ORDER — ONDANSETRON 4 MG PO TBDP: 4.0000 mg | ORAL_TABLET | Freq: Three times a day (TID) | ORAL | Status: DC | PRN

## 2013-02-04 NOTE — Progress Notes (Signed)
Subjective:    Patient ID: [name], male    DOB: May 10, 1950, 63 y.o.   MRN: [id number]  HPI 63 year old male presents for evaluation of sudden onset of body aches, fever, chills, and 1 episode of emesis.  States symptoms started suddenly last night and have continue to worsen throughout the day today.  He has had 1 episode of emesis today after eating some vegetables for lunch. Has been able to keep down small sips of water.  Complains of small amount of nasal congestion. Also complains of slight dysuria and increase in frequency today.  Denies nausea, abdominal pain, dizziness, headache, cough, diarrhea, constipation, hematochezia, or melena. No sore throat, otalgia, chest pain, or SOB.  No known flu contacts.   Had CPE in 10/2012. No hx of BPH.   Sexually active with 1 male partner - no concern about STI's.   Hx of HTN controlled on current tx plan - BP is slightly elevated today.      Review of Systems  Constitutional: Positive for fever and chills.  HENT: Positive for congestion. Negative for ear pain, rhinorrhea and sore throat.   Respiratory: Negative for cough, chest tightness, shortness of breath and wheezing.   Gastrointestinal: Positive for vomiting. Negative for nausea and abdominal pain.  Neurological: Negative for dizziness, syncope and headaches.       Objective:   Physical Exam  Constitutional: He is oriented to person, place, and time. He appears well-developed and well-nourished.  HENT:  Head: Normocephalic and atraumatic.  Right Ear: Hearing, tympanic membrane, external ear and ear canal normal.  Left Ear: Hearing, tympanic membrane, external ear and ear canal normal.  Mouth/Throat: Uvula is midline, oropharynx is clear and moist and mucous membranes are normal. No oropharyngeal exudate.  Eyes: Conjunctivae are normal.  Neck: Normal range of motion. Neck supple.  Cardiovascular: Normal rate, regular rhythm and normal heart sounds.   Pulmonary/Chest:  Effort normal and breath sounds normal.  Abdominal: Soft. Bowel sounds are normal. There is no tenderness (no CVA tenderness bilaterally). There is no rebound and no guarding.  Lymphadenopathy:    He has no cervical adenopathy.  Neurological: He is alert and oriented to person, place, and time.  Psychiatric: He has a normal mood and affect. His behavior is normal. Judgment and thought content normal.      Results for orders placed in visit on 02/04/13  POCT CBC      Result Value Range   WBC 7.8  4.6 - 10.2 K/uL   Lymph, poc 0.7  0.6 - 3.4   POC LYMPH PERCENT 9.4 (*) 10 - 50 %L   MID (cbc) 0.2  0 - 0.9   POC MID % 3.1  0 - 12 %M   POC Granulocyte 6.8  2 - 6.9   Granulocyte percent 87.5 (*) 37 - 80 %G   RBC 5.84  4.69 - 6.13 M/uL   Hemoglobin 15.5  14.1 - 18.1 g/dL   HCT, POC 49.2  43.5 - 53.7 %   MCV 84.3  80 - 97 fL   MCH, POC 26.5 (*) 27 - 31.2 pg   MCHC 31.5 (*) 31.8 - 35.4 g/dL   RDW, POC 16.1     Platelet Count, POC 279  142 - 424 K/uL   MPV 9.2  0 - 99.8 fL  POCT INFLUENZA A/B      Result Value Range   Influenza A, POC Negative     Influenza B, POC Negative  POCT UA - MICROSCOPIC ONLY      Result Value Range   WBC, Ur, HPF, POC neg     RBC, urine, microscopic neg     Bacteria, U Microscopic neg     Mucus, UA neg     Epithelial cells, urine per micros neg     Crystals, Ur, HPF, POC neg     Casts, Ur, LPF, POC neg     Yeast, UA neg    POCT URINALYSIS DIPSTICK      Result Value Range   Color, UA yellow     Clarity, UA clear     Glucose, UA neg     Bilirubin, UA neg     Ketones, UA 40     Spec Grav, UA 1.020     Blood, UA neg     pH, UA 5.5     Protein, UA neg     Urobilinogen, UA 0.2     Nitrite, UA neg     Leukocytes, UA Negative         Assessment & Plan:   Viral gastroenteritis  Dysuria - Plan: POCT UA - Microscopic Only, POCT urinalysis dipstick  Vomiting  Fever, unspecified - Plan: POCT CBC, POCT Influenza A/B  Likely viral illness.  Conservative treatment with BRAT diet pushing fluids Zofran 8 mg ODT q8hours prn nausea.  RTC precautions discussed - abdominal pain, worsening nausea or vomiting, dizziness, etc Out of work until fever free. Tylenol as directed for fever Follow up if symptoms worsening or fail to improve.

## 2013-02-15 ENCOUNTER — Other Ambulatory Visit: Payer: Self-pay | Admitting: Endocrinology

## 2013-02-18 ENCOUNTER — Telehealth: Payer: Self-pay | Admitting: Endocrinology

## 2013-02-18 ENCOUNTER — Other Ambulatory Visit: Payer: Self-pay

## 2013-02-18 MED ORDER — METFORMIN HCL ER 500 MG PO TB24: ORAL_TABLET | ORAL | Status: DC

## 2013-02-18 NOTE — Telephone Encounter (Signed)
Script sent. Pt notified.

## 2013-02-18 NOTE — Telephone Encounter (Signed)
Out of Metformin. Needs refills to be called into [facility] on [address] for a 15 day supply to hold him until he can get his RX from his mail order pharmacy. His mail order pharmacy does not have a current RX either. We need to renew the RX will them too.

## 2013-02-18 NOTE — Telephone Encounter (Signed)
Pt needed a 15 day supply of Metformin because mail order has not sent medication out yet.

## 2013-02-22 ENCOUNTER — Ambulatory Visit (AMBULATORY_SURGERY_CENTER): Payer: Self-pay | Admitting: *Deleted

## 2013-02-22 VITALS — Ht 66.0 in | Wt 174.0 lb

## 2013-02-22 DIAGNOSIS — Z1211 Encounter for screening for malignant neoplasm of colon: Secondary | ICD-10-CM

## 2013-02-22 MED ORDER — MOVIPREP 100 G PO SOLR: ORAL | Status: DC

## 2013-02-22 NOTE — Progress Notes (Signed)
No allergies to eggs or soy. No problems with anesthesia.  

## 2013-02-28 ENCOUNTER — Encounter: Payer: Self-pay | Admitting: Internal Medicine

## 2013-03-04 ENCOUNTER — Encounter: Payer: Self-pay | Admitting: Internal Medicine

## 2013-03-04 ENCOUNTER — Ambulatory Visit (AMBULATORY_SURGERY_CENTER): Payer: BC Managed Care – PPO | Admitting: Internal Medicine

## 2013-03-04 VITALS — BP 139/75 | HR 54 | Temp 97.0°F | Resp 10 | Ht 66.0 in | Wt 174.0 lb

## 2013-03-04 DIAGNOSIS — Z1211 Encounter for screening for malignant neoplasm of colon: Secondary | ICD-10-CM

## 2013-03-04 LAB — GLUCOSE, CAPILLARY
GLUCOSE-CAPILLARY: 197 mg/dL — AB (ref 70–99)
GLUCOSE-CAPILLARY: 98 mg/dL (ref 70–99)
Glucose-Capillary: 75 mg/dL (ref 70–99)

## 2013-03-04 MED ORDER — SODIUM CHLORIDE 0.9 % IV SOLN: 500.0000 mL | INTRAVENOUS | Status: DC

## 2013-03-04 MED ORDER — DEXTROSE 5 % IV SOLN: INTRAVENOUS | Status: DC

## 2013-03-04 NOTE — Op Note (Signed)
[facility]  [address]. [place], [address]   COLONOSCOPY PROCEDURE REPORT  PATIENT: [name], [name]  MR#: [id number] BIRTHDATE: 22-Jan-1950 , 62  yrs. old GENDER: Male ENDOSCOPIST: Jerene Bears, MD REFERRED [id number] Lenna Gilford, M.D. PROCEDURE DATE:  03/04/2013 PROCEDURE:   Colonoscopy, screening First Screening Colonoscopy - Avg.  risk and is 50 yrs.  old or older - No.  Prior Negative Screening - Now for repeat screening. 10 or more years since last screening  History of Adenoma - Now for follow-up colonoscopy & has been > or = to 3 yrs.  N/A  Polyps Removed Today? No.  Recommend repeat exam, <10 yrs? No. ASA CLASS:   Class III INDICATIONS:average risk screening and Last colonoscopy performed 11 years ago. MEDICATIONS: MAC sedation, administered by CRNA, Propofol (Diprivan), and Propofol (Diprivan) 230 mg IV  DESCRIPTION OF PROCEDURE:   After the risks benefits and alternatives of the procedure were thoroughly explained, informed consent was obtained.  A digital rectal exam revealed a skin tag. The [id number]  endoscope was introduced through the anus and advanced to the terminal ileum which was intubated for a short distance. No adverse events experienced.   The quality of the prep was good, using MoviPrep  The instrument was then slowly withdrawn as the colon was fully examined.   COLON FINDINGS: The mucosa appeared normal in the terminal ileum. Small diverticulum in the TI.  Mild diverticulosis was noted in the ascending colon, at the hepatic flexure, splenic flexure, in the descending colon, and sigmoid colon.   The colon mucosa was otherwise normal.  Retroflexed views revealed small internal hemorrhoids and skin tag. The time to cecum=2 minutes 42 seconds. Withdrawal time=9 minutes 53 seconds.  The scope was withdrawn and the procedure completed. COMPLICATIONS: There were no complications.  ENDOSCOPIC IMPRESSION: 1.   Normal mucosa in the  terminal ileum 2.   Mild diverticulosis was noted in the ascending colon, at the hepatic flexure, splenic flexure, in the descending colon, and sigmoid colon 3.   The colon mucosa was otherwise normal  RECOMMENDATIONS: 1.  High fiber diet 2.  You should continue to follow colorectal cancer screening guidelines for "routine risk" patients with a repeat colonoscopy in 10 years.  There is no need for FOBT (stool) testing for at least 5 years.   eSigned:  Jerene Bears, MD 03/04/2013 8:23 AM  cc: The Patient and Noralee Space, MD

## 2013-03-04 NOTE — Patient Instructions (Signed)
YOU HAD AN ENDOSCOPIC PROCEDURE TODAY AT [facility]: Refer to the procedure report that was given to you for any specific questions about what was found during the examination.  If the procedure report does not answer your questions, please call your gastroenterologist to clarify.  If you requested that your care partner not be given the details of your procedure findings, then the procedure report has been included in a sealed envelope for you to review at your convenience later.  YOU SHOULD EXPECT: Some feelings of bloating in the abdomen. Passage of more gas than usual.  Walking can help get rid of the air that was put into your GI tract during the procedure and reduce the bloating. If you had a lower endoscopy (such as a colonoscopy or flexible sigmoidoscopy) you may notice spotting of blood in your stool or on the toilet paper. If you underwent a bowel prep for your procedure, then you may not have a normal bowel movement for a few days.  DIET: Your first meal following the procedure should be a light meal and then it is ok to progress to your normal diet.  A half-sandwich or bowl of soup is an example of a good first meal.  Heavy or fried foods are harder to digest and may make you feel nauseous or bloated.  Likewise meals heavy in dairy and vegetables can cause extra gas to form and this can also increase the bloating.  Drink plenty of fluids but you should avoid alcoholic beverages for 24 hours.  ACTIVITY: Your care partner should take you home directly after the procedure.  You should plan to take it easy, moving slowly for the rest of the day.  You can resume normal activity the day after the procedure however you should NOT DRIVE or use heavy machinery for 24 hours (because of the sedation medicines used during the test).    SYMPTOMS TO REPORT IMMEDIATELY: A gastroenterologist can be reached at any hour.  During normal business hours, 8:30 AM to 5:00 PM Monday through Friday,  call [phone].  After hours and on weekends, please call the GI answering service at [phone] who will take a message and have the physician on call contact you.   Following lower endoscopy (colonoscopy or flexible sigmoidoscopy):  Excessive amounts of blood in the stool  Significant tenderness or worsening of abdominal pains  Swelling of the abdomen that is new, acute  Fever of 100F or higher  FOLLOW UP: Our staff will call the home number listed on your records the next business day following your procedure to check on you and address any questions or concerns that you may have at that time regarding the information given to you following your procedure. This is a courtesy call and so if there is no answer at the home number and we have not heard from you through the emergency physician on call, we will assume that you have returned to your regular daily activities without incident.  SIGNATURES/CONFIDENTIALITY: You and/or your care partner have signed paperwork which will be entered into your electronic medical record.  These signatures attest to the fact that that the information above on your After Visit Summary has been reviewed and is understood.  Full responsibility of the confidentiality of this discharge information lies with you and/or your care-partner.  Please continue your normal medications  Please read over handouts about diverticulosis, high fiber diets and hemorrhoids  Follow up colon in 10 years

## 2013-03-04 NOTE — Progress Notes (Signed)
Procedure ends, to recovery, report given and VSS. 

## 2013-03-05 ENCOUNTER — Telehealth: Payer: Self-pay | Admitting: *Deleted

## 2013-03-05 NOTE — Telephone Encounter (Signed)
Message left

## 2013-03-14 ENCOUNTER — Telehealth: Payer: Self-pay | Admitting: Pulmonary Disease

## 2013-03-14 NOTE — Telephone Encounter (Signed)
Pt will leave work at 5:00.  Please call his cell # if after 5:00.  Cell # [phone].

## 2013-03-14 NOTE — Telephone Encounter (Signed)
Pt scheduled for appointment with TP 03/15/13 11am Nothing further needed.

## 2013-03-14 NOTE — Telephone Encounter (Signed)
Per TP: pt needs ov to assess and verify meds.  LMOM TCB x1.  Aware that pt leaves work at [employer].

## 2013-03-14 NOTE — Telephone Encounter (Signed)
[name] patient: Last OV 11-01-12. I spoke with the pt and he states he has enrolled in a program at work for pts with HBP and he has his BP checked regularly. He states the last few times his BP has been elevated and nurse advised he call us. He states on 03/07/13 BP was 160/90, few days later it was 160/80, and today it was 170/94. He is taking atenolol 50mg  once daily. Pt states he has been having some lightheadedness over the last few days as well. He denies any chest pain, vision disturbances, headaches. Please advise. [name], CMA Allergies  Allergen Reactions  . Actos [Pioglitazone]     edema  . Azithromycin     REACTION: rash  . Tetracycline     REACTION: rash  . Trandolapril     REACTION: ALLERGIC to ACEs w/ Angioedema  . Other Rash    MAGIC MOUTHWASH: CAUSES RASH

## 2013-03-15 ENCOUNTER — Encounter: Payer: Self-pay | Admitting: Adult Health

## 2013-03-15 ENCOUNTER — Ambulatory Visit (INDEPENDENT_AMBULATORY_CARE_PROVIDER_SITE_OTHER): Payer: BC Managed Care – PPO | Admitting: Adult Health

## 2013-03-15 VITALS — BP 160/98 | HR 49 | Temp 97.6°F | Ht 66.0 in | Wt 173.4 lb

## 2013-03-15 DIAGNOSIS — E119 Type 2 diabetes mellitus without complications: Secondary | ICD-10-CM

## 2013-03-15 DIAGNOSIS — I10 Essential (primary) hypertension: Secondary | ICD-10-CM

## 2013-03-15 MED ORDER — HYDROCHLOROTHIAZIDE 25 MG PO TABS: 25.0000 mg | ORAL_TABLET | Freq: Every day | ORAL | Status: DC

## 2013-03-15 NOTE — Patient Instructions (Addendum)
Low salt diet  Avoid NSAIDS and Decongestants.  Add HCTZ 25mg  daily  Continue on Atenolol 50mg  once daily  Keep b/p log daily , call if >160  Dr. Lenna Gilford is retiring from Primary care , we are going to set you up with new Primary care doctor to manage your care.  Call if not improving or worsens, will need sooner follow up or go to emergency room.  Will need ov with new PCP in 4 weeks to follow on b/p.

## 2013-03-18 NOTE — Assessment & Plan Note (Signed)
B/P not at goal   Plan  Low salt diet  Avoid NSAIDS and Decongestants.  Add HCTZ 25mg  daily  Continue on Atenolol 50mg  once daily  Keep b/p log daily , call if >160  Dr. Lenna Gilford is retiring from Primary care , we are going to set you up with new Primary care doctor to manage your care.  Call if not improving or worsens, will need sooner follow up or go to emergency room.  Will need ov with new PCP in 4 weeks to follow on b/p.

## 2013-03-18 NOTE — Progress Notes (Signed)
Subjective:    Patient ID: [name], male    DOB: 10/13/1950, 63 y.o.   MRN: [id number]  HPI 63 y/o BM   03/15/13 Acute OV  Presents for elevated b/p. Noted b/p elevated ~140-160 over last 1 month . Does have on /off headaches , feels like tension headache at times. Not severe, dull at times. Not taking NSAIDS. Takes tylenol some.   Denies any CP, vision disturbances, speech changes, ext weakness, palpitations.  Denies decongestants. Occasional NSAID use.  Currently on Atenolol 50mg  daily .  Ankles puffy occasionally in evening, resolves in am.  Had recent labs , w/ neg protein on UA.  BMET 10/2012 w/ good GFR .  Previously had lasix to take as needed, but does not have anymore.           Problem List:   Hx of BRONCHITIS (ICD-490) - he smokes an occas cigar... no recent symptoms. ~  CXR 4/08 showed norm size heart, clear lungs, screw in right shoulder... ~  CXR 6/13 showed borderline heart size, clear lungs, prior surg right shoulder area...  HYPERTENSION (ICD-401.9) - on ATENOLOL 50mg /d...  ~  5/11:  BP=142/82 and similar at home... denies HA, fatigue, visual changes, CP, palipit, dizziness, syncope, dyspnea, edema, etc...  ~  4/12:  BP= 124/84 & feeling well- remains largely asymptomatic... ~  6/13:  BP=142/88 & he denies CP, palpit, SOB, edema, etc... ~  2/14:  on Aten50, Lasix20 prn; BP= 140/90 & he denies CP, palpit, dizzy, SOB, edema. ~  10/14: on Aten50, Lasix20 prn; BP= 144/92 & he remains essentially asymptomatic; he is encouraged to take the Lasix20 daily for BP.  CEREBROVASCULAR DISEASE (ICD-437.9) - on ASA 81mg /d... ~  CDopplers 11/08 w/ mild plaque in CCA, 0-39%bilat ICAstenoses... f/u planned 55yrs. ~  CDopplers 11/10 showed stable mild carotid dis bilat- 1-39% bilat ICA stenoses... f/u 65yrs. ~  4/12:  He denies any cerebral ischemic symptoms;  f/u CDopplers due 11/12... ~  CDopplers 12/12 showed mild plaque in bulbs, normal velocities, 0-39% ICA stenoses; f/u  37yrs...  HYPERCHOLESTEROLEMIA, BORDERLINE (ICD-272.4) - on diet alone... ~  [place] 4/08 showed TChol 191, TG 59, HDL 60, LDL 119... discussed diet + exerc efforts. ~  [place] 9/09 (wt=173#) showed TChol 186, TG 48, HDL 61, LDL 116 ~  FLP 9/10 (wt=175#) showed TChol 191, TG 43, HDL 57, LDL 125 ~  FL[place] 5/11 (wt=181#) showed TChol 194, TG 39, HDL 51, LDL 135... LDL not at goal needs better diet, wt reduction, or consider meds. ~  [place] 4/12 (wt=161#) showed TChol 170, TG 40, HDL 55, LDL 107... Great job w/ wt reduction! ~  [place] 6/13 (wt=169#) showed TChol 179, TG 49, HDL 57, LDL 112 ~  FL[place] 10/14 on diet alone (wt=178#) showed TChol 218, TG 59, HDL 64, LDL 147... He does not want meds, needs better diet.  DIABETES MELLITUS (ICD-250.00) >. Under the care of DrEllison for endocrine... ~  labs 8/08 showed BS=86, HGA1c=6.4.Marland KitchenMarland Kitchen on Metformin500mg /d. ~  labs 3/09 showed BS= 95, HgA1c= 6.9 ~  labs 9/09 showed BS= 123, HgA1c= 6.8.Marland KitchenMarland Kitchen continue same. ~  labs 3/10 showed BS= 85, A1c= 6.9 ~  labs 8/10 showed BS= 95, A1c= 6.5.Marland KitchenMarland Kitchen continue same. ~  labs 5/11 showed BS= 126, A1c= 7.1.Marland KitchenMarland Kitchen Pt requested Endocrine referral. ~  DrEllison increased his Metformin to 2Bid... ~  Labs 5/12 showed BS= 95, A1c=6.3 ~  Labs 5-6/13 showed BS= 98, A1c= 6.3 ~  1/14: He had neg Eye exam from  DrBrewington- no DM retinopathy... ~  Labs 1/14 on MetformER500-2Bid showed BS=143, A1c=6.6, Umicroalb=neg... ~  Intol to Actos w/ edema... ~  Labs 10/14 on Metform500-2Bid & Januvia100 started 10/14 showed BS=169, A1c=7.7  Colonoscopy 12/04 by DrPatterson was WNL... f/u planned 65yrs & we will refer chart to GI...  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 we tried Rx w/ Androgel x29mo but pt stopped it- siting no diff in how he felt & didn't want to continue... we discussed options including shots for Low-T at the Urology center but he  declines Rx... also has some ED issues & uses VIAGRA 100mg  Prn. ~  5/12: pt reqested f/u Testosterone level = 193 (350-890) same as prev but now he would like to try Rx again; REC> ANDROGEL PUMP 4 pumps daily & rec repeat testosterone level in 6 weeks, also recommended to discuss this prob w/ his Endocrinologist... ~  Labs 11/12 showed Testos level = 210 on the Androgel, pt told this was not a signif response & rec referred to Endocrine for management... ~  6/13:  Pt noted to be on CLOMIPHENE for his Low-T per DrEllison, he had several questions for me that I could not answer & he is rec to f/u w/ DrEllison or seek 2nd opinion w/ Urology... ~  10/13:  He reports that the Clomiphene did not help & he stopped it; he saw DrNesi at the Urology Center w/ rec to start Androgel 1.62% trial... ~  10/14:  He remains on Androgel & Viagra per DrNesi> feeling well w/ good strength & energy...   ? of FIBROMYALGIA (ICD-729.1) & KNEE PAIN (ICD-719.46) - doing well lately without fatigue, without pain... he uses MOBIC 7.5mg  Prn & VOLTAREN Gel prn...  Health Maintenance >> ~  GI:  Followed by DrPatterson; he had colonoscopy 2004- neg & f/u planned 50yrs... ~  GU:  Followed by Gerilyn Pilgrim for LowT & ED; back onandrogel 1.62% and viagra prn... ~  Immuniz:  He gets the yearly seasonal Flu vaccine (last-10/14);  Last Tetanus booster in 2003- due for f/u TDAP but not given yet...   Past Surgical History  Procedure Laterality Date  . Shoulder surgery Right 2004, 1990    Outpatient Encounter Prescriptions as of 03/15/2013  Medication Sig  . acyclovir (ZOVIRAX) 800 MG tablet Take 800 mg by mouth daily.   [name] aspirin 81 MG tablet Take 81 mg by mouth daily.    [name] atenolol (TENORMIN) 50 MG tablet TAKE ONE TABLET BY MOUTH ONCE DAILY **REPLACES  BYSTOLIC**  . bromocriptine (PARLODEL) 2.5 MG tablet Take 0.5 tablets (1.25 mg total) by mouth at bedtime.  . meloxicam (MOBIC) 7.5 MG tablet TAKE ONE TABLET BY MOUTH EVERY DAY AS NEEDED  FOR  ARTHRITIS  PAIN  . metFORMIN (GLUCOPHAGE-XR) 500 MG 24 hr tablet TAKE 2 TABLETS TWICE A DAY  . Multiple Vitamin (MULTIVITAMIN) tablet Take 1 tablet by mouth daily.    . sildenafil (VIAGRA) 100 MG tablet Take 100 mg by mouth daily as needed. Use as directed   . Testosterone (ANDROGEL) 20.25 MG/1.25GM (1.62%) GEL Apply to shoulders every morning  . hydrochlorothiazide (HYDRODIURIL) 25 MG tablet Take 1 tablet (25 mg total) by mouth daily.  . [DISCONTINUED] ondansetron (ZOFRAN ODT) 4 MG disintegrating tablet Take 1-2 tablets (4-8 mg total) by mouth every 8 (eight) hours as needed for nausea or vomiting.    Allergies  Allergen Reactions  . Actos [  Pioglitazone]     edema  . Azithromycin     REACTION: rash  . Tetracycline     REACTION: rash  . Trandolapril     REACTION: ALLERGIC to ACEs w/ Angioedema  . Other Rash    MAGIC MOUTHWASH: CAUSES RASH    Current Medications, Allergies, Past Medical History, Past Surgical History, Family History, and Social History were reviewed in [employer] record.   Review of Systems         See HPI - all other systems neg except as noted... Wt is up 7# to 175#;  The patient denies anorexia, fever, weight loss, vision loss, decreased hearing, hoarseness, chest pain, syncope, dyspnea on exertion, peripheral edema, prolonged cough, headaches, hemoptysis, abdominal pain, melena, hematochezia, severe indigestion/heartburn, hematuria, incontinence, muscle weakness, suspicious skin lesions, transient blindness, difficulty walking, depression, unusual weight change, abnormal bleeding, enlarged lymph nodes, and angioedema.     Objective:   Physical Exam      WD, WN, 63 y/o BM in NAD... GENERAL:  Alert & oriented; pleasant & cooperative... HEENT:  [place]/AT, EOM-wnl, PERRLA, EACs-clear, TMs-wnl, NOSE-clear, THROAT-clear & wnl NECK:  Supple w/ full ROM; no JVD; normal carotid impulses w/o bruits; no thyromegaly or nodules palpated; no  lymphadenopathy. CHEST:  Clear to P & A; without wheezes/ rales/ or rhonchi. HEART:  Regular Rhythm; without murmurs/ rubs/ or gallops. ABDOMEN:  Soft & nontender; normal bowel sounds; no organomegaly or masses detected.  EXT: without deformities or arthritic changes; no varicose veins/ venous insuffic/ or edema. NEURO:  CN's intact; motor testing normal; sensory testing normal; gait normal & balance OK. DERM:  No lesions noted; no rash etc...    Assessment & Plan:

## 2013-04-11 ENCOUNTER — Ambulatory Visit (INDEPENDENT_AMBULATORY_CARE_PROVIDER_SITE_OTHER): Payer: BC Managed Care – PPO | Admitting: Physician Assistant

## 2013-04-11 ENCOUNTER — Telehealth: Payer: Self-pay | Admitting: Physician Assistant

## 2013-04-11 ENCOUNTER — Encounter: Payer: Self-pay | Admitting: Physician Assistant

## 2013-04-11 VITALS — BP 149/96 | HR 59 | Temp 97.7°F | Resp 16 | Ht 66.5 in | Wt 171.5 lb

## 2013-04-11 DIAGNOSIS — E291 Testicular hypofunction: Secondary | ICD-10-CM

## 2013-04-11 DIAGNOSIS — E119 Type 2 diabetes mellitus without complications: Secondary | ICD-10-CM

## 2013-04-11 DIAGNOSIS — I10 Essential (primary) hypertension: Secondary | ICD-10-CM

## 2013-04-11 DIAGNOSIS — Z23 Encounter for immunization: Secondary | ICD-10-CM

## 2013-04-11 DIAGNOSIS — E785 Hyperlipidemia, unspecified: Secondary | ICD-10-CM

## 2013-04-11 LAB — LIPID PANEL
Cholesterol: 199 mg/dL (ref 0–200)
HDL: 50 mg/dL (ref >39)
LDL Cholesterol: 128 mg/dL — ABNORMAL HIGH (ref 0–99)
Total CHOL/HDL Ratio: 4 Ratio
Triglycerides: 103 mg/dL (ref <150)
VLDL: 21 mg/dL (ref 0–40)

## 2013-04-11 MED ORDER — HYDROCHLOROTHIAZIDE 25 MG PO TABS: ORAL_TABLET | ORAL | Status: DC

## 2013-04-11 NOTE — Patient Instructions (Signed)
It was a pleasure participating in your care today.  For Blood Pressure -- please begin taking Hydrochlorothiazide twice daily.  I have sent in a new prescription with the new instructions for you to begin after current pill bottle is empty.  Follow up in 2 weeks for BP recheck.  Increase aerobic exercise.  Read information below on the DASH diet.  For Cholesterol -- please obtain labs.  I will call you with your results.  You LDL (lousy) cholesterol goal is < 100 because you are a diabetic. Increase aerobic activity. Limit intake of foods high in cholesterol and saturated fats.  Begin taking fish oil supplement daily.  We will reassess cholesterol in 3 months.  If still above goal, we will need to start a prescription medication.  Continue follow-up with Urology and Endocrinology.  DASH Diet The DASH diet stands for "Dietary Approaches to Stop Hypertension." It is a healthy eating plan that has been shown to reduce high blood pressure (hypertension) in as little as 14 days, while also possibly providing other significant health benefits. These other health benefits include reducing the risk of breast cancer after menopause and reducing the risk of type 2 diabetes, heart disease, colon cancer, and stroke. Health benefits also include weight loss and slowing kidney failure in patients with chronic kidney disease.  DIET GUIDELINES  Limit salt (sodium). Your diet should contain less than 1500 mg of sodium daily.  Limit refined or processed carbohydrates. Your diet should include mostly whole grains. Desserts and added sugars should be used sparingly.  Include small amounts of heart-healthy fats. These types of fats include nuts, oils, and tub margarine. Limit saturated and trans fats. These fats have been shown to be harmful in the body. CHOOSING FOODS  The following food groups are based on a 2000 calorie diet. See your Registered Dietitian for individual calorie needs. Grains and Grain Products (6  to 8 servings daily)  Eat More Often: Whole-wheat bread, brown rice, whole-grain or wheat pasta, quinoa, popcorn without added fat or salt (air popped).  Eat Less Often: White bread, white pasta, white rice, cornbread. Vegetables (4 to 5 servings daily)  Eat More Often: Fresh, frozen, and canned vegetables. Vegetables may be raw, steamed, roasted, or grilled with a minimal amount of fat.  Eat Less Often/Avoid: Creamed or fried vegetables. Vegetables in a cheese sauce. Fruit (4 to 5 servings daily)  Eat More Often: All fresh, canned (in natural juice), or frozen fruits. Dried fruits without added sugar. One hundred percent fruit juice ( cup [237 mL] daily).  Eat Less Often: Dried fruits with added sugar. Canned fruit in light or heavy syrup. [employer], Fish, and Poultry (2 servings or less daily. One serving is 3 to 4 oz [85-114 g]).  Eat More Often: Ninety percent or leaner ground beef, tenderloin, sirloin. Round cuts of beef, chicken breast, [place] breast. All fish. Grill, bake, or broil your meat. Nothing should be fried.  Eat Less Often/Avoid: Fatty cuts of meat, [place], or chicken leg, thigh, or wing. Fried cuts of meat or fish. Dairy (2 to 3 servings)  Eat More Often: Low-fat or fat-free milk, low-fat plain or light yogurt, reduced-fat or part-skim cheese.  Eat Less Often/Avoid: Milk (whole, 2%).Whole milk yogurt. Full-fat cheeses. Nuts, Seeds, and Legumes (4 to 5 servings per week)  Eat More Often: All without added salt.  Eat Less Often/Avoid: Salted nuts and seeds, canned beans with added salt. Fats and Sweets (limited)  Eat More Often: Vegetable oils,  tub margarines without trans fats, sugar-free gelatin. Mayonnaise and salad dressings.  Eat Less Often/Avoid: Coconut oils, palm oils, butter, stick margarine, cream, half and half, cookies, candy, pie. FOR MORE INFORMATION The Dash Diet Eating Plan: www.dashdiet.org Document Released: 12/09/2010 Document Revised:  03/14/2011 Document Reviewed: 12/09/2010 [facility] Patient Information 2014 [place], [place].   Managing Your High Blood Pressure Blood pressure is a measurement of how forceful your blood is pressing against the walls of the arteries. Arteries are muscular tubes within the circulatory system. Blood pressure does not stay the same. Blood pressure rises when you are active, excited, or nervous; and it lowers during sleep and relaxation. If the numbers measuring your blood pressure stay above normal most of the time, you are at risk for health problems. High blood pressure (hypertension) is a long-term (chronic) condition in which blood pressure is elevated. A blood pressure reading is recorded as two numbers, such as 120 over 80 (or 120/80). The first, higher number is called the systolic pressure. It is a measure of the pressure in your arteries as the heart beats. The second, lower number is called the diastolic pressure. It is a measure of the pressure in your arteries as the heart relaxes between beats.  Keeping your blood pressure in a normal range is important to your overall health and prevention of health problems, such as heart disease and stroke. When your blood pressure is uncontrolled, your heart has to work harder than normal. High blood pressure is a very common condition in adults because blood pressure tends to rise with age. Men and women are equally likely to have hypertension but at different times in life. Before age 71, men are more likely to have hypertension. After 62 years of age, women are more likely to have it. Hypertension is especially common in African Americans. This condition often has no signs or symptoms. The cause of the condition is usually not known. Your caregiver can help you come up with a plan to keep your blood pressure in a normal, healthy range. BLOOD PRESSURE STAGES Blood pressure is classified into four stages: normal, prehypertension, stage 1, and stage 2. Your  blood pressure reading will be used to determine what type of treatment, if any, is necessary. Appropriate treatment options are tied to these four stages:  Normal  Systolic pressure (mm Hg): below 120.  Diastolic pressure (mm Hg): below 80. Prehypertension  Systolic pressure (mm Hg): 120 to 139.  Diastolic pressure (mm Hg): 80 to 89. Stage1  Systolic pressure (mm Hg): 140 to 159.  Diastolic pressure (mm Hg): 90 to 99. Stage2  Systolic pressure (mm Hg): 160 or above.  Diastolic pressure (mm Hg): 100 or above. RISKS RELATED TO HIGH BLOOD PRESSURE Managing your blood pressure is an important responsibility. Uncontrolled high blood pressure can lead to:  A heart attack.  A stroke.  A weakened blood vessel (aneurysm).  Heart failure.  Kidney damage.  Eye damage.  Metabolic syndrome.  Memory and concentration problems. HOW TO MANAGE YOUR BLOOD PRESSURE Blood pressure can be managed effectively with lifestyle changes and medicines (if needed). Your caregiver will help you come up with a plan to bring your blood pressure within a normal range. Your plan should include the following: Education  Read all information provided by your caregivers about how to control blood pressure.  Educate yourself on the latest guidelines and treatment recommendations. New research is always being done to further define the risks and treatments for high blood pressure. Lifestylechanges  Control your weight.  Avoid smoking.  Stay physically active.  Reduce the amount of salt in your diet.  Reduce stress.  Control any chronic conditions, such as high cholesterol or diabetes.  Reduce your alcohol intake. Medicines  Several medicines (antihypertensive medicines) are available, if needed, to bring blood pressure within a normal range. Communication  Review all the medicines you take with your caregiver because there may be side effects or interactions.  Talk with your  caregiver about your diet, exercise habits, and other lifestyle factors that may be contributing to high blood pressure.  See your caregiver regularly. Your caregiver can help you create and adjust your plan for managing high blood pressure. RECOMMENDATIONS FOR TREATMENT AND FOLLOW-UP  The following recommendations are based on current guidelines for managing high blood pressure in nonpregnant adults. Use these recommendations to identify the proper follow-up period or treatment option based on your blood pressure reading. You can discuss these options with your caregiver.  Systolic pressure of 803 to 212 or diastolic pressure of 80 to 89: Follow up with your caregiver as directed.  Systolic pressure of 248 to 250 or diastolic pressure of 90 to 100: Follow up with your caregiver within 2 months.  Systolic pressure above 037 or diastolic pressure above 048: Follow up with your caregiver within 1 month.  Systolic pressure above 889 or diastolic pressure above 169: Consider antihypertensive therapy; follow up with your caregiver within 1 week.  Systolic pressure above 450 or diastolic pressure above 388: Begin antihypertensive therapy; follow up with your caregiver within 1 week. Document Released: 09/14/2011 Document Reviewed: 09/14/2011 [facility] Patient Information 2014 [place], [place].

## 2013-04-11 NOTE — Telephone Encounter (Signed)
Relevant patient education assigned to patient using Emmi. ° °

## 2013-04-11 NOTE — Progress Notes (Signed)
Patient presents to clinic today to establish care.  Acute Concerns: No acute concerns at today's visit.  Chronic Issues: Hypertension -- Patient currently on Atenolol and HCTZ.  BP 146/96 in clinic.  Patient also with DM.  Patient denies chest pain, palpitations, LH, dizziness, headaches or vision change.    Hyperlipidemia -- Patient with prior LDL above goal.  LDL goal < 100.  Due for fasting lipid panel.  Diabetes Mellitus -- Controlled with Metformin and Bromocriptine.  Last A1C 7.7.  Denies retinopathy, neuropathy, polyuria.  Patient sees Ophthalmology twice a year. Patient followed by endocrinology.  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  Currently on Androgel.  Health Maintenance: Dental -- UTD Vision -- Sees Ophthalmology twice a year. Immunizations -- Unsure of Tetanus.  Needs Tetanus shot.   Colonoscopy -- 2015; no abnormal findings.  Past Medical History  Diagnosis Date  . History of bronchitis   . Hypertension   . Cerebrovascular disease, unspecified   . Other and unspecified hyperlipidemia     Hypercholesterolemia, Borderline  . Diabetes mellitus   . Testicular hypofunction   . Impotence of organic origin   . Knee pain     Right    Past Surgical History  Procedure Laterality Date  . Shoulder surgery Right 2004, 1990  . Wisdom tooth extraction      Current Outpatient Prescriptions on File Prior to Visit  Medication Sig Dispense Refill  . acyclovir (ZOVIRAX) 800 MG tablet Take 800 mg by mouth daily.       [name] aspirin 81 MG tablet Take 81 mg by mouth daily.        [name] atenolol (TENORMIN) 50 MG tablet TAKE ONE TABLET BY MOUTH ONCE DAILY **REPLACES  BYSTOLIC**  90 tablet  2  . bromocriptine (PARLODEL) 2.5 MG tablet Take 0.5 tablets (1.25 mg total) by mouth at bedtime.  45 tablet  3  . meloxicam (MOBIC) 7.5 MG tablet TAKE ONE TABLET BY MOUTH EVERY DAY AS NEEDED FOR  ARTHRITIS  PAIN  30 tablet  6  . metFORMIN (GLUCOPHAGE-XR) 500 MG 24 hr tablet TAKE 2 TABLETS TWICE A  DAY  30 tablet  0  . Multiple Vitamin (MULTIVITAMIN) tablet Take 1 tablet by mouth daily.        . sildenafil (VIAGRA) 100 MG tablet Take 100 mg by mouth daily as needed. Use as directed       . Testosterone (ANDROGEL) 20.25 MG/1.25GM (1.62%) GEL Apply to shoulders every morning       No current facility-administered medications on file prior to visit.    Allergies  Allergen Reactions  . Actos [Pioglitazone]     edema  . Azithromycin     REACTION: rash  . Tetracycline     REACTION: rash  . Trandolapril     REACTION: ALLERGIC to ACEs w/ Angioedema  . Other Rash    MAGIC MOUTHWASH: CAUSES RASH    Family History  Problem Relation Age of Onset  . Heart disease Mother 58    Deceased  . Diabetes Father 34    Deceased  . Heart disease Sister     heart trouble  . Diabetes Brother   . Colon cancer Neg Hx   . Heart attack Mother   . Hypertension Mother   . Hypertension Father   . Cancer Paternal Aunt     x1-Bone  . Cancer Paternal Aunt     multiple  . Heart attack Maternal Grandmother   . Healthy Sister  x6    History   Social History  . Marital Status: Married    Spouse Name: [name]    Number of Children: 0  . Years of Education: N/A   Occupational History  .     Social History Main Topics  . Smoking status: Former Smoker    Types: Cigarettes, Cigars    Quit date: 01/03/1978  . Smokeless tobacco: Never Used  . Alcohol Use: Yes     Comment: rare beer  . Drug Use: No  . Sexual Activity: Yes   Other Topics Concern  . Not on file   Social History Narrative   Regular exercise-yes   Caffeine-2 cups   Married=wife [name] 22 years   Cigar Smoker-quit smoking Jan 2010    Review of Systems  Constitutional: Negative for fever and weight loss.  HENT: Negative for ear discharge, ear pain, hearing loss and tinnitus.   Eyes: Negative for blurred vision, double vision, photophobia and pain.  Respiratory: Negative for cough and shortness of breath.    Cardiovascular: Negative for chest pain and palpitations.  Gastrointestinal: Positive for heartburn. Negative for nausea, vomiting, abdominal pain, diarrhea, constipation, blood in stool and melena.  Genitourinary: Negative for dysuria, urgency, frequency, hematuria and flank pain.       Nocturia x 1.  Neurological: Negative for dizziness, loss of consciousness and headaches.  Endo/Heme/Allergies: Negative for environmental allergies.  Psychiatric/Behavioral: Negative for depression, suicidal ideas, hallucinations and substance abuse. The patient is not nervous/anxious and does not have insomnia.    BP 149/96  Pulse 59  Temp(Src) 97.7 F (36.5 C) (Oral)  Resp 16  Ht 5' 6.5" (1.689 m)  Wt 171 lb 8 oz (77.792 kg)  BMI 27.27 kg/m2  SpO2 99%  Physical Exam  Vitals reviewed. Constitutional: He is oriented to person, place, and time and well-developed, well-nourished, and in no distress.  HENT:  Head: Normocephalic and atraumatic.  Right Ear: External ear normal.  Left Ear: External ear normal.  Nose: Nose normal.  Mouth/Throat: Oropharynx is clear and moist. No oropharyngeal exudate.  Eyes: Conjunctivae are normal. Pupils are equal, round, and reactive to light.  Neck: Neck supple.  Cardiovascular: Normal rate, regular rhythm, normal heart sounds and intact distal pulses.   Pulmonary/Chest: Effort normal and breath sounds normal. No respiratory distress. He has no wheezes. He has no rales. He exhibits no tenderness.  Abdominal: Soft. Bowel sounds are normal. There is no tenderness.  Neurological: He is alert and oriented to person, place, and time.  Skin: Skin is warm and dry. No rash noted.  Psychiatric: Affect normal.    Recent Results (from the past 2160 hour(s))  POCT CBC     Status: Abnormal   Collection Time    02/04/13  8:24 PM      Result Value Ref Range   WBC 7.8  4.6 - 10.2 K/uL   Lymph, poc 0.7  0.6 - 3.4   POC LYMPH PERCENT 9.4 (*) 10 - 50 %L   MID (cbc) 0.2  0 -  0.9   POC MID % 3.1  0 - 12 %M   POC Granulocyte 6.8  2 - 6.9   Granulocyte percent 87.5 (*) 37 - 80 %G   RBC 5.84  4.69 - 6.13 M/uL   Hemoglobin 15.5  14.1 - 18.1 g/dL   HCT, POC 49.2  43.5 - 53.7 %   MCV 84.3  80 - 97 fL   MCH, POC 26.5 (*) 27 - 31.2  pg   MCHC 31.5 (*) 31.8 - 35.4 g/dL   RDW, POC 16.1     Platelet Count, POC 279  142 - 424 K/uL   MPV 9.2  0 - 99.8 fL  POCT UA - MICROSCOPIC ONLY     Status: None   Collection Time    02/04/13  8:26 PM      Result Value Ref Range   WBC, Ur, HPF, POC neg     RBC, urine, microscopic neg     Bacteria, U Microscopic neg     Mucus, UA neg     Epithelial cells, urine per micros neg     Crystals, Ur, HPF, POC neg     Casts, Ur, LPF, POC neg     Yeast, UA neg    POCT INFLUENZA A/B     Status: None   Collection Time    02/04/13  8:27 PM      Result Value Ref Range   Influenza A, POC Negative     Influenza B, POC Negative    POCT URINALYSIS DIPSTICK     Status: None   Collection Time    02/04/13  8:27 PM      Result Value Ref Range   Color, UA yellow     Clarity, UA clear     Glucose, UA neg     Bilirubin, UA neg     Ketones, UA 40     Spec Grav, UA 1.020     Blood, UA neg     pH, UA 5.5     Protein, UA neg     Urobilinogen, UA 0.2     Nitrite, UA neg     Leukocytes, UA Negative    GLUCOSE, CAPILLARY     Status: None   Collection Time    03/04/13  7:19 AM      Result Value Ref Range   Glucose-Capillary 75  70 - 99 mg/dL   Comment 1 Documented in Chart    GLUCOSE, CAPILLARY     Status: None   Collection Time    03/04/13  7:58 AM      Result Value Ref Range   Glucose-Capillary 98  70 - 99 mg/dL   Comment 1 Documented in Chart    GLUCOSE, CAPILLARY     Status: Abnormal   Collection Time    03/04/13  8:27 AM      Result Value Ref Range   Glucose-Capillary 197 (*) 70 - 99 mg/dL  LIPID PANEL     Status: Abnormal   Collection Time    04/11/13 10:35 AM      Result Value Ref Range   Cholesterol 199  0 - 200 mg/dL    Comment: ATP III Classification:           < 200        mg/dL        Desirable          200 - 239     mg/dL        Borderline High          >= 240        mg/dL        High         Triglycerides 103  <150 mg/dL   HDL 50  >39 mg/dL   Total CHOL/HDL Ratio 4.0     VLDL 21  0 - 40 mg/dL   LDL Cholesterol 128 (*) 0 - 99  mg/dL   Comment:       Total Cholesterol/HDL Ratio:CHD Risk                            Coronary Heart Disease Risk Table                                            Men       Women              1/2 Average Risk              3.4        3.3                  Average Risk              5.0        4.4               2X Average Risk              9.6        7.1               3X Average Risk             23.4       11.0     Use the calculated Patient Ratio above and the CHD Risk table      to determine the patient's CHD Risk.     ATP III Classification (LDL):           < 100        mg/dL         Optimal          100 - 129     mg/dL         Near or Above Optimal          130 - 159     mg/dL         Borderline High          160 - 189     mg/dL         High           > 190        mg/dL         Very High          Assessment/Plan: Hyperlipidemia LDL goal < 100 Discussed cardiovascular risk giving HTN, HLD and DM.  Patient wishes to attempt a trial of lifestyle changes.  Will allow a short trial of TLC.  Patient also started on fish oil.  Discussed appropriate diet and exercise.  Will reassess in 3 months.  If still elevated will begin statin therapy.  Patient voices understanding.  Need for prophylactic vaccination with tetanus-diphtheria (TD) Immunization given by nursing staff.  Hypertension Continue metoprolol.  Will increase HCTZ from 25 mg QD to 25 mg BID.  Follow-up in 2-4 weeks for BP recheck.  TESTICULAR HYPOFUNCTION Followed by Urology.  DIABETES MELLITUS Continue current care.  Will defer A1C testing and management to patient's Endocrinologist.

## 2013-04-11 NOTE — Progress Notes (Signed)
Pre visit review using our clinic review tool, if applicable. No additional management support is needed unless otherwise documented below in the visit note/SLS  

## 2013-04-14 DIAGNOSIS — Z23 Encounter for immunization: Secondary | ICD-10-CM | POA: Insufficient documentation

## 2013-04-14 DIAGNOSIS — E785 Hyperlipidemia, unspecified: Secondary | ICD-10-CM | POA: Insufficient documentation

## 2013-04-14 DIAGNOSIS — I10 Essential (primary) hypertension: Secondary | ICD-10-CM | POA: Insufficient documentation

## 2013-04-14 NOTE — Assessment & Plan Note (Signed)
Followed by Urology 

## 2013-04-14 NOTE — Assessment & Plan Note (Signed)
Immunization given by nursing staff. 

## 2013-04-14 NOTE — Assessment & Plan Note (Signed)
Continue current care.  Will defer A1C testing and management to patient's Endocrinologist.

## 2013-04-14 NOTE — Assessment & Plan Note (Signed)
Continue metoprolol.  Will increase HCTZ from 25 mg QD to 25 mg BID.  Follow-up in 2-4 weeks for BP recheck.

## 2013-04-14 NOTE — Assessment & Plan Note (Signed)
Discussed cardiovascular risk giving HTN, HLD and DM.  Patient wishes to attempt a trial of lifestyle changes.  Will allow a short trial of TLC.  Patient also started on fish oil.  Discussed appropriate diet and exercise.  Will reassess in 3 months.  If still elevated will begin statin therapy.  Patient voices understanding.

## 2013-04-25 ENCOUNTER — Encounter: Payer: Self-pay | Admitting: Physician Assistant

## 2013-04-25 ENCOUNTER — Ambulatory Visit (INDEPENDENT_AMBULATORY_CARE_PROVIDER_SITE_OTHER): Payer: BC Managed Care – PPO | Admitting: Physician Assistant

## 2013-04-25 VITALS — BP 116/78 | HR 65 | Temp 97.7°F | Resp 16 | Ht 66.5 in | Wt 169.2 lb

## 2013-04-25 DIAGNOSIS — I10 Essential (primary) hypertension: Secondary | ICD-10-CM

## 2013-04-25 DIAGNOSIS — E785 Hyperlipidemia, unspecified: Secondary | ICD-10-CM

## 2013-04-25 MED ORDER — ROSUVASTATIN CALCIUM 5 MG PO TABS: 5.0000 mg | ORAL_TABLET | Freq: Every day | ORAL | Status: DC

## 2013-04-25 NOTE — Assessment & Plan Note (Signed)
Continue current regimen.  Will recheck BMP at next visit.

## 2013-04-25 NOTE — Progress Notes (Signed)
Patient presents to clinic today c/o for follow-up for hypertension.  At last visit HCTZ was increased to BID dosing.  BP normotensive at today's visit.  Denies headache, vision changes, chest pain, palpitations. LH or dizziness.    Past Medical History  Diagnosis Date  . History of bronchitis   . Hypertension   . Cerebrovascular disease, unspecified   . Other and unspecified hyperlipidemia     Hypercholesterolemia, Borderline  . Diabetes mellitus   . Testicular hypofunction   . Impotence of organic origin   . Knee pain     Right    Current Outpatient Prescriptions on File Prior to Visit  Medication Sig Dispense Refill  . acyclovir (ZOVIRAX) 800 MG tablet Take 800 mg by mouth daily.       [name] aspirin 81 MG tablet Take 81 mg by mouth daily.        [name] atenolol (TENORMIN) 50 MG tablet TAKE ONE TABLET BY MOUTH ONCE DAILY **REPLACES  BYSTOLIC**  90 tablet  2  . bromocriptine (PARLODEL) 2.5 MG tablet Take 0.5 tablets (1.25 mg total) by mouth at bedtime.  45 tablet  3  . hydrochlorothiazide (HYDRODIURIL) 25 MG tablet Take 1 tablet by mouth twice daily.  60 tablet  1  . meloxicam (MOBIC) 7.5 MG tablet TAKE ONE TABLET BY MOUTH EVERY DAY AS NEEDED FOR  ARTHRITIS  PAIN  30 tablet  6  . metFORMIN (GLUCOPHAGE-XR) 500 MG 24 hr tablet TAKE 2 TABLETS TWICE A DAY  30 tablet  0  . Multiple Vitamin (MULTIVITAMIN) tablet Take 1 tablet by mouth daily.        . sildenafil (VIAGRA) 100 MG tablet Take 100 mg by mouth daily as needed. Use as directed       . Testosterone (ANDROGEL) 20.25 MG/1.25GM (1.62%) GEL Apply to shoulders every morning       No current facility-administered medications on file prior to visit.    Allergies  Allergen Reactions  . Actos [Pioglitazone]     edema  . Azithromycin     REACTION: rash  . Tetracycline     REACTION: rash  . Trandolapril     REACTION: ALLERGIC to ACEs w/ Angioedema  . Other Rash    MAGIC MOUTHWASH: CAUSES RASH    Family History  Problem Relation Age of  Onset  . Heart disease Mother 84    Deceased  . Diabetes Father 33    Deceased  . Heart disease Sister     heart trouble  . Diabetes Brother   . Colon cancer Neg Hx   . Heart attack Mother   . Hypertension Mother   . Hypertension Father   . Cancer Paternal Aunt     x1-Bone  . Cancer Paternal Aunt     multiple  . Heart attack Maternal Grandmother   . Healthy Sister     x6    History   Social History  . Marital Status: Married    Spouse Name: [name]    Number of Children: 0  . Years of Education: N/A   Occupational History  .     Social History Main Topics  . Smoking status: Former Smoker    Types: Cigarettes, Cigars    Quit date: 01/03/1978  . Smokeless tobacco: Never Used  . Alcohol Use: Yes     Comment: rare beer  . Drug Use: No  . Sexual Activity: Yes   Other Topics Concern  . None   Social History Narrative  Regular exercise-yes   Caffeine-2 cups   Married=wife [name] 22 years   Cigar Smoker-quit smoking Jan 2010   Review of Systems - See HPI.  All other ROS are negative.  BP 116/78  Pulse 65  Temp(Src) 97.7 F (36.5 C) (Oral)  Resp 16  Ht 5' 6.5" (1.689 m)  Wt 169 lb 4 oz (76.771 kg)  BMI 26.91 kg/m2  SpO2 98%  Physical Exam  Vitals reviewed. Constitutional: He is oriented to person, place, and time and well-developed, well-nourished, and in no distress.  HENT:  Head: Normocephalic and atraumatic.  Eyes: Conjunctivae are normal. Pupils are equal, round, and reactive to light.  Neck: Neck supple.  Cardiovascular: Normal rate, regular rhythm, normal heart sounds and intact distal pulses.   Pulmonary/Chest: Effort normal and breath sounds normal. No respiratory distress. He has no wheezes. He has no rales. He exhibits no tenderness.  Neurological: He is alert and oriented to person, place, and time.  Skin: Skin is warm and dry. No rash noted.  Psychiatric: Affect normal.    Recent Results (from the past 2160 hour(s))  POCT CBC     Status:  Abnormal   Collection Time    02/04/13  8:24 PM      Result Value Ref Range   WBC 7.8  4.6 - 10.2 K/uL   Lymph, poc 0.7  0.6 - 3.4   POC LYMPH PERCENT 9.4 (*) 10 - 50 %L   MID (cbc) 0.2  0 - 0.9   POC MID % 3.1  0 - 12 %M   POC Granulocyte 6.8  2 - 6.9   Granulocyte percent 87.5 (*) 37 - 80 %G   RBC 5.84  4.69 - 6.13 M/uL   Hemoglobin 15.5  14.1 - 18.1 g/dL   HCT, POC 49.2  43.5 - 53.7 %   MCV 84.3  80 - 97 fL   MCH, POC 26.5 (*) 27 - 31.2 pg   MCHC 31.5 (*) 31.8 - 35.4 g/dL   RDW, POC 16.1     Platelet Count, POC 279  142 - 424 K/uL   MPV 9.2  0 - 99.8 fL  POCT UA - MICROSCOPIC ONLY     Status: None   Collection Time    02/04/13  8:26 PM      Result Value Ref Range   WBC, Ur, HPF, POC neg     RBC, urine, microscopic neg     Bacteria, U Microscopic neg     Mucus, UA neg     Epithelial cells, urine per micros neg     Crystals, Ur, HPF, POC neg     Casts, Ur, LPF, POC neg     Yeast, UA neg    POCT INFLUENZA A/B     Status: None   Collection Time    02/04/13  8:27 PM      Result Value Ref Range   Influenza A, POC Negative     Influenza B, POC Negative    POCT URINALYSIS DIPSTICK     Status: None   Collection Time    02/04/13  8:27 PM      Result Value Ref Range   Color, UA yellow     Clarity, UA clear     Glucose, UA neg     Bilirubin, UA neg     Ketones, UA 40     Spec Grav, UA 1.020     Blood, UA neg     pH, UA 5.5  Protein, UA neg     Urobilinogen, UA 0.2     Nitrite, UA neg     Leukocytes, UA Negative    GLUCOSE, CAPILLARY     Status: None   Collection Time    03/04/13  7:19 AM      Result Value Ref Range   Glucose-Capillary 75  70 - 99 mg/dL   Comment 1 Documented in Chart    GLUCOSE, CAPILLARY     Status: None   Collection Time    03/04/13  7:58 AM      Result Value Ref Range   Glucose-Capillary 98  70 - 99 mg/dL   Comment 1 Documented in Chart    GLUCOSE, CAPILLARY     Status: Abnormal   Collection Time    03/04/13  8:27 AM      Result Value  Ref Range   Glucose-Capillary 197 (*) 70 - 99 mg/dL  LIPID PANEL     Status: Abnormal   Collection Time    04/11/13 10:35 AM      Result Value Ref Range   Cholesterol 199  0 - 200 mg/dL   Comment: ATP III Classification:           < 200        mg/dL        Desirable          200 - 239     mg/dL        Borderline High          >= 240        mg/dL        High         Triglycerides 103  <150 mg/dL   HDL 50  >39 mg/dL   Total CHOL/HDL Ratio 4.0     VLDL 21  0 - 40 mg/dL   LDL Cholesterol 128 (*) 0 - 99 mg/dL   Comment:       Total Cholesterol/HDL Ratio:CHD Risk                            Coronary Heart Disease Risk Table                                            Men       Women              1/2 Average Risk              3.4        3.3                  Average Risk              5.0        4.4               2X Average Risk              9.6        7.1               3X Average Risk             23.4       11.0     Use the calculated Patient Ratio above and the CHD Risk table  to determine the patient's CHD Risk.     ATP III Classification (LDL):           < 100        mg/dL         Optimal          100 - 129     mg/dL         Near or Above Optimal          130 - 159     mg/dL         Borderline High          160 - 189     mg/dL         High           > 190        mg/dL         Very High          Assessment/Plan: Hypertension Continue current regimen.  Will recheck BMP at next visit.  Hyperlipidemia LDL goal < 100 Will begin 5 mg Crestor daily.  Receck Hepatic panel in 6 weeks.  Recheck lipids in 3 months.

## 2013-04-25 NOTE — Progress Notes (Signed)
Pre visit review using our clinic review tool, if applicable. No additional management support is needed unless otherwise documented below in the visit note/SLS  

## 2013-04-25 NOTE — Patient Instructions (Signed)
Please take Crestor daily.  Continue other medications as directed.  We will recheck your cholesterol in 3 months.  Would like to check your liver function in 1 month after starting Crestor.

## 2013-04-25 NOTE — Assessment & Plan Note (Signed)
Will begin 5 mg Crestor daily.  Receck Hepatic panel in 6 weeks.  Recheck lipids in 3 months.

## 2013-04-26 ENCOUNTER — Encounter: Payer: Self-pay | Admitting: *Deleted

## 2013-04-30 ENCOUNTER — Telehealth: Payer: Self-pay | Admitting: Endocrinology

## 2013-04-30 ENCOUNTER — Telehealth: Payer: Self-pay | Admitting: *Deleted

## 2013-04-30 MED ORDER — METFORMIN HCL ER 500 MG PO TB24: ORAL_TABLET | ORAL | Status: DC

## 2013-04-30 NOTE — Telephone Encounter (Signed)
Patient states his rx was only 30 days  Metformin 500 mg   Patient is stressing he needs more medication  Please advise this patient   Thank You

## 2013-04-30 NOTE — Telephone Encounter (Signed)
Medication sent.

## 2013-04-30 NOTE — Telephone Encounter (Signed)
Metformin refilled

## 2013-05-01 MED ORDER — METFORMIN HCL ER 500 MG PO TB24: ORAL_TABLET | ORAL | Status: DC

## 2013-05-01 NOTE — Telephone Encounter (Signed)
Call in 5 days worth of metformin to hold him over until his mail service get his other script to him please

## 2013-05-01 NOTE — Telephone Encounter (Signed)
Rx sent and pt informed via voicemail.

## 2013-05-20 ENCOUNTER — Telehealth: Payer: Self-pay | Admitting: *Deleted

## 2013-05-20 DIAGNOSIS — E785 Hyperlipidemia, unspecified: Secondary | ICD-10-CM

## 2013-05-20 NOTE — Telephone Encounter (Signed)
Pt presented to the lab. Orders entered as below:  Hyperlipidemia LDL goal < 100 - Leeanne Rio, PA-C at 04/25/2013 12:58 PM     Status: Written Related Problem: Hyperlipidemia LDL goal < 100    Will begin 5 mg Crestor daily. Receck Hepatic panel in 6 weeks.

## 2013-05-21 LAB — HEPATIC FUNCTION PANEL
ALBUMIN: 4.3 g/dL (ref 3.5–5.2)
ALK PHOS: 56 U/L (ref 39–117)
ALT: 33 U/L (ref 0–53)
AST: 29 U/L (ref 0–37)
BILIRUBIN DIRECT: 0.1 mg/dL (ref 0.0–0.3)
BILIRUBIN INDIRECT: 0.3 mg/dL (ref 0.2–1.2)
BILIRUBIN TOTAL: 0.4 mg/dL (ref 0.2–1.2)
Total Protein: 6.9 g/dL (ref 6.0–8.3)

## 2013-05-31 ENCOUNTER — Telehealth: Payer: Self-pay | Admitting: *Deleted

## 2013-05-31 NOTE — Telephone Encounter (Signed)
Result Notes    Notes Recorded by Rockwell Germany, CMA on 05/23/2013 at 5:58 PM [facility] with contact name and number [for return call, if needed] RE: results and further provider instructions/SLS  ------  Notes Recorded by Ronny Flurry, CMA on 05/21/2013 at 4:20 PM Left message on home # to return my call. ------  Notes Recorded by Leeanne Rio, PA-C on 05/21/2013 at 8:04 AM Liver function looking good. Patient to continue crestor. Will recheck cholesterol level in 2 months (3 months from last visit). Will hopefully see good results with current dose of Crestor.   LMOM with contact name and number RE: explicit explanation of results as given on 05.21.2015 with provider instructions/SLS

## 2013-05-31 NOTE — Telephone Encounter (Signed)
Message copied by Rockwell Germany on Fri May 31, 2013  1:54 PM ------      Message from: [name]      Created: Fri May 31, 2013 10:14 AM       Patient is requesting lab results. Best# [phone] ------

## 2013-06-04 ENCOUNTER — Telehealth: Payer: Self-pay | Admitting: *Deleted

## 2013-06-04 DIAGNOSIS — E785 Hyperlipidemia, unspecified: Secondary | ICD-10-CM

## 2013-06-04 MED ORDER — ROSUVASTATIN CALCIUM 5 MG PO TABS: 5.0000 mg | ORAL_TABLET | Freq: Every day | ORAL | Status: DC

## 2013-06-04 NOTE — Telephone Encounter (Signed)
Returned pt's call. LMOVM for pt to cb.

## 2013-06-04 NOTE — Telephone Encounter (Signed)
Message copied by Julieta Bellini on Tue Jun 04, 2013 10:47 AM ------      Message from: Irven Baltimore      Created: Tue Jun 04, 2013  9:45 AM       Please call patient regarding last lab results. He states that he did talk to a nurse earlier but that she only discussed one result, not all. Best # [phone]. Thanks ------

## 2013-06-04 NOTE — Telephone Encounter (Addendum)
Spoke to pt about lab results and also about rechecking cholesterol in 2 months. Pt is out of Crestor samples and needs rx sent to his pharmacy. Pt would like a call back when rx is sent.

## 2013-06-04 NOTE — Telephone Encounter (Signed)
Patient notified

## 2013-06-04 NOTE — Telephone Encounter (Signed)
Rx sent to pharmacy   

## 2013-06-17 ENCOUNTER — Other Ambulatory Visit: Payer: Self-pay | Admitting: Endocrinology

## 2013-08-14 ENCOUNTER — Ambulatory Visit (INDEPENDENT_AMBULATORY_CARE_PROVIDER_SITE_OTHER): Payer: BC Managed Care – PPO | Admitting: Endocrinology

## 2013-08-14 ENCOUNTER — Encounter: Payer: Self-pay | Admitting: Endocrinology

## 2013-08-14 VITALS — BP 138/80 | HR 67 | Temp 97.9°F | Ht 66.5 in | Wt 168.0 lb

## 2013-08-14 DIAGNOSIS — E119 Type 2 diabetes mellitus without complications: Secondary | ICD-10-CM

## 2013-08-14 LAB — MICROALBUMIN / CREATININE URINE RATIO
CREATININE, U: 162.9 mg/dL
Microalb Creat Ratio: 0.2 mg/g (ref 0.0–30.0)
Microalb, Ur: 0.4 mg/dL (ref 0.0–1.9)

## 2013-08-14 LAB — HEMOGLOBIN A1C: HEMOGLOBIN A1C: 7.9 % — AB (ref 4.6–6.5)

## 2013-08-14 MED ORDER — SITAGLIPTIN PHOSPHATE 100 MG PO TABS: 100.0000 mg | ORAL_TABLET | Freq: Every day | ORAL | Status: DC

## 2013-08-14 MED ORDER — BROMOCRIPTINE MESYLATE 2.5 MG PO TABS
2.5000 mg | ORAL_TABLET | Freq: Every day | ORAL | Status: DC
Start: 2013-08-14 — End: 2014-09-12

## 2013-08-14 NOTE — Patient Instructions (Signed)
blood and urine tests are being requested for you today.  We'll contact you with results. If it is high, we'll add "januvia." check your blood sugar once a day.  vary the time of day when you check, between before the 3 meals, and at bedtime.  also check if you have symptoms of your blood sugar being too high or too low.  please keep a record of the readings and bring it to your next appointment here.  please call us sooner if your blood sugar goes below 70, or if you have a lot of readings over 200. Please come back for a follow-up appointment in 6 months.

## 2013-08-14 NOTE — Progress Notes (Signed)
Subjective:    Patient ID: [name], male    DOB: 1950/07/23, 63 y.o.   MRN: [id number]  HPI Pt returns for f/u of type 2 DM (dx'ed 1996; e has mild if any neuropathy of the lower extremities, but he has associated cerebrovascular disease; he did not tolerate actos (edema); he takes 2 oral DM meds).  no cbg record, but states cbg's are well-controlled.  pt states he feels well in general. Past Medical History  Diagnosis Date  . History of bronchitis   . Hypertension   . Cerebrovascular disease, unspecified   . Other and unspecified hyperlipidemia     Hypercholesterolemia, Borderline  . Diabetes mellitus   . Testicular hypofunction   . Impotence of organic origin   . Knee pain     Right    Past Surgical History  Procedure Laterality Date  . Shoulder surgery Right 2004, 1990  . Wisdom tooth extraction      History   Social History  . Marital Status: Married    Spouse Name: [name]    Number of Children: 0  . Years of Education: N/A   Occupational History  .     Social History Main Topics  . Smoking status: Former Smoker    Types: Cigarettes, Cigars    Quit date: 01/03/1978  . Smokeless tobacco: Never Used  . Alcohol Use: Yes     Comment: rare beer  . Drug Use: No  . Sexual Activity: Yes   Other Topics Concern  . Not on file   Social History Narrative   Regular exercise-yes   Caffeine-2 cups   Married=wife [name] 22 years   Cigar Smoker-quit smoking Jan 2010    Current Outpatient Prescriptions on File Prior to Visit  Medication Sig Dispense Refill  . acyclovir (ZOVIRAX) 800 MG tablet Take 800 mg by mouth daily.       [name] aspirin 81 MG tablet Take 81 mg by mouth daily.        [name] atenolol (TENORMIN) 50 MG tablet TAKE ONE TABLET BY MOUTH ONCE DAILY **REPLACES  BYSTOLIC**  90 tablet  2  . hydrochlorothiazide (HYDRODIURIL) 25 MG tablet Take 1 tablet by mouth twice daily.  60 tablet  1  . meloxicam (MOBIC) 7.5 MG tablet TAKE ONE TABLET BY MOUTH EVERY DAY AS  NEEDED FOR  ARTHRITIS  PAIN  30 tablet  6  . metFORMIN (GLUCOPHAGE-XR) 500 MG 24 hr tablet TAKE 2 TABLETS TWICE A DAY  10 tablet  0  . Multiple Vitamin (MULTIVITAMIN) tablet Take 1 tablet by mouth daily.        . rosuvastatin (CRESTOR) 5 MG tablet Take 1 tablet (5 mg total) by mouth daily.  90 tablet  3  . sildenafil (VIAGRA) 100 MG tablet Take 100 mg by mouth daily as needed. Use as directed       . Testosterone (ANDROGEL) 20.25 MG/1.25GM (1.62%) GEL Apply to shoulders every morning       No current facility-administered medications on file prior to visit.    Allergies  Allergen Reactions  . Actos [Pioglitazone]     edema  . Azithromycin     REACTION: rash  . Tetracycline     REACTION: rash  . Trandolapril     REACTION: ALLERGIC to ACEs w/ Angioedema  . Other Rash    MAGIC MOUTHWASH: CAUSES RASH    Family History  Problem Relation Age of Onset  . Heart disease Mother 60    Deceased  .  Diabetes Father 18    Deceased  . Heart disease Sister     heart trouble  . Diabetes Brother   . Colon cancer Neg Hx   . Heart attack Mother   . Hypertension Mother   . Hypertension Father   . Cancer Paternal Aunt     x1-Bone  . Cancer Paternal Aunt     multiple  . Heart attack Maternal Grandmother   . Healthy Sister     x6    BP 138/80  Pulse 67  Temp(Src) 97.9 F (36.6 C) (Oral)  Ht 5' 6.5" (1.689 m)  Wt 168 lb (76.204 kg)  BMI 26.71 kg/m2  SpO2 97%    Review of Systems Denies weight change and hypoglycemia.    Objective:   Physical Exam Pulses: dorsalis pedis intact bilat.   Feet: no deformity. normal color and temp.  no edema Skin:  no ulcer on the feet.   Neuro: sensation is intact to touch on the feet   Lab Results  Component Value Date   HGBA1C 7.9* 08/14/2013       Assessment & Plan:  DM: mild exacerbation    Patient is advised the following: Patient Instructions  blood and urine tests are being requested for you today.  We'll contact you with  results. If it is high, we'll add "januvia." check your blood sugar once a day.  vary the time of day when you check, between before the 3 meals, and at bedtime.  also check if you have symptoms of your blood sugar being too high or too low.  please keep a record of the readings and bring it to your next appointment here.  please call us sooner if your blood sugar goes below 70, or if you have a lot of readings over 200. Please come back for a follow-up appointment in 6 months.

## 2013-08-15 ENCOUNTER — Other Ambulatory Visit: Payer: Self-pay | Admitting: Endocrinology

## 2013-08-16 ENCOUNTER — Telehealth: Payer: Self-pay | Admitting: Endocrinology

## 2013-08-16 MED ORDER — METFORMIN HCL ER 500 MG PO TB24: ORAL_TABLET | ORAL | Status: DC

## 2013-08-16 NOTE — Telephone Encounter (Signed)
Patient stated that  He need authorization on med Metformin 500 mg, and would like to know if he could get a 60 day supply. Please advise

## 2013-08-16 NOTE — Telephone Encounter (Signed)
Rx sent to pharmacy per pt's request. Pt notified.

## 2013-08-28 ENCOUNTER — Other Ambulatory Visit: Payer: Self-pay | Admitting: Pulmonary Disease

## 2013-09-27 ENCOUNTER — Other Ambulatory Visit: Payer: Self-pay | Admitting: *Deleted

## 2013-09-27 MED ORDER — ATENOLOL 50 MG PO TABS: ORAL_TABLET | ORAL | Status: DC

## 2013-09-27 NOTE — Telephone Encounter (Signed)
Rx request to mail order pharmacy/SLS  

## 2013-10-01 ENCOUNTER — Other Ambulatory Visit: Payer: Self-pay | Admitting: *Deleted

## 2013-10-01 MED ORDER — ATENOLOL 50 MG PO TABS: ORAL_TABLET | ORAL | Status: DC

## 2013-10-01 NOTE — Telephone Encounter (Signed)
Rx request to mail order pharmacy per fax request/SLS

## 2013-10-15 ENCOUNTER — Ambulatory Visit (INDEPENDENT_AMBULATORY_CARE_PROVIDER_SITE_OTHER): Payer: BC Managed Care – PPO | Admitting: Physician Assistant

## 2013-10-15 ENCOUNTER — Encounter: Payer: Self-pay | Admitting: Physician Assistant

## 2013-10-15 VITALS — BP 139/79 | HR 72 | Temp 98.1°F | Resp 16 | Ht 66.5 in | Wt 166.2 lb

## 2013-10-15 DIAGNOSIS — R220 Localized swelling, mass and lump, head: Secondary | ICD-10-CM

## 2013-10-15 NOTE — Patient Instructions (Signed)
Please use Peroxyl and Biotene mouthwash daily.  Take a benadryl.  Watch your food intake to see if certain foods are triggering symptoms.  I think it safe to resume Januvia. Call or return to clinic if symptoms recur.  We may need to consult an allergist.

## 2013-10-15 NOTE — Progress Notes (Signed)
Pre visit review using our clinic review tool, if applicable. No additional management support is needed unless otherwise documented below in the visit note/SLS  

## 2013-10-20 DIAGNOSIS — R220 Localized swelling, mass and lump, head: Secondary | ICD-10-CM | POA: Insufficient documentation

## 2013-10-20 NOTE — Progress Notes (Signed)
Patient presents to clinic today c/o swelling and burning of lips and gums over the past week.  Denies fever, chills, malaise.  Denies trauma or injury.  Denies dyspnea or dysphagia. Was seen at [facility] and told symptoms could be reaction to medication.  Was told to hold his Januvia as it was last medication prescribed.  Patient has been on this medication for over a year without recent dose change.  Denies change to symptoms with cessation of medication.  Symptoms have resolved today after using some benadryl.  Past Medical History  Diagnosis Date  . History of bronchitis   . Hypertension   . Cerebrovascular disease, unspecified   . Other and unspecified hyperlipidemia     Hypercholesterolemia, Borderline  . Diabetes mellitus   . Testicular hypofunction   . Impotence of organic origin   . Knee pain     Right    Current Outpatient Prescriptions on File Prior to Visit  Medication Sig Dispense Refill  . acyclovir (ZOVIRAX) 800 MG tablet Take 800 mg by mouth daily.       [name] aspirin 81 MG tablet Take 81 mg by mouth daily.        [name] atenolol (TENORMIN) 50 MG tablet TAKE ONE TABLET BY MOUTH ONCE DAILY **REPLACES  BYSTOLIC**  90 tablet  1  . bromocriptine (PARLODEL) 2.5 MG tablet Take 1 tablet (2.5 mg total) by mouth at bedtime.  90 tablet  3  . metFORMIN (GLUCOPHAGE-XR) 500 MG 24 hr tablet TAKE 2 TABLETS TWICE A DAY  120 tablet  2  . Multiple Vitamin (MULTIVITAMIN) tablet Take 1 tablet by mouth daily.        . rosuvastatin (CRESTOR) 5 MG tablet Take 1 tablet (5 mg total) by mouth daily.  90 tablet  3  . sildenafil (VIAGRA) 100 MG tablet Take 100 mg by mouth daily as needed. Use as directed       . sitaGLIPtin (JANUVIA) 100 MG tablet Take 1 tablet (100 mg total) by mouth daily.  90 tablet  3  . Testosterone (ANDROGEL) 20.25 MG/1.25GM (1.62%) GEL Apply [2] pumps to each to shoulders every morning      . meloxicam (MOBIC) 7.5 MG tablet TAKE ONE TABLET BY MOUTH EVERY DAY AS NEEDED FOR  ARTHRITIS   PAIN  30 tablet  6   No current facility-administered medications on file prior to visit.    Allergies  Allergen Reactions  . Actos [Pioglitazone]     edema  . Azithromycin     REACTION: rash  . Tetracycline     REACTION: rash  . Trandolapril     REACTION: ALLERGIC to ACEs w/ Angioedema  . Other Rash    MAGIC MOUTHWASH: CAUSES RASH    Family History  Problem Relation Age of Onset  . Heart disease Mother 69    Deceased  . Diabetes Father 57    Deceased  . Heart disease Sister     heart trouble  . Diabetes Brother   . Colon cancer Neg Hx   . Heart attack Mother   . Hypertension Mother   . Hypertension Father   . Cancer Paternal Aunt     x1-Bone  . Cancer Paternal Aunt     multiple  . Heart attack Maternal Grandmother   . Healthy Sister     x6    History   Social History  . Marital Status: Married    Spouse Name: [name]    Number of Children: 0  .  Years of Education: N/A   Occupational History  .     Social History Main Topics  . Smoking status: Former Smoker    Types: Cigarettes, Cigars    Quit date: 01/03/1978  . Smokeless tobacco: Never Used  . Alcohol Use: Yes     Comment: rare beer  . Drug Use: No  . Sexual Activity: Yes   Other Topics Concern  . None   Social History Narrative   Regular exercise-yes   Caffeine-2 cups   Married=wife [name] 22 years   Cigar Smoker-quit smoking Jan 2010    Review of Systems - See HPI.  All other ROS are negative.  BP 139/79  Pulse 72  Temp(Src) 98.1 F (36.7 C) (Oral)  Resp 16  Ht 5' 6.5" (1.689 m)  Wt 166 lb 4 oz (75.411 kg)  BMI 26.43 kg/m2  SpO2 100%  Physical Exam  Vitals reviewed. Constitutional: He is oriented to person, place, and time and well-developed, well-nourished, and in no distress.  HENT:  Head: Normocephalic and atraumatic.  Right Ear: Tympanic membrane, external ear and ear canal normal.  Left Ear: Tympanic membrane, external ear and ear canal normal.  Nose: Nose normal.    Mouth/Throat: Uvula is midline, oropharynx is clear and moist and mucous membranes are normal. No oral lesions. Normal dentition. No dental abscesses, uvula swelling or dental caries. No oropharyngeal exudate, posterior oropharyngeal edema, posterior oropharyngeal erythema or tonsillar abscesses.  Eyes: Conjunctivae are normal.  Neck: Neck supple.  Cardiovascular: Normal rate, regular rhythm and normal heart sounds.   Pulmonary/Chest: Effort normal and breath sounds normal. No respiratory distress. He has no wheezes. He has no rales. He exhibits no tenderness.  Neurological: He is alert and oriented to person, place, and time.  Skin: Skin is warm and dry. No rash noted.  Psychiatric: Affect normal.   Recent Results (from the past 2160 hour(s))  HEMOGLOBIN A1C     Status: Abnormal   Collection Time    08/14/13  8:33 AM      Result Value Ref Range   Hemoglobin A1C 7.9 (*) 4.6 - 6.5 %   Comment: Glycemic Control Guidelines for People with Diabetes:Non Diabetic:  <6%Goal of Therapy: <7%Additional Action Suggested:  >8%   MICROALBUMIN / CREATININE URINE RATIO     Status: None   Collection Time    08/14/13  8:33 AM      Result Value Ref Range   Microalb, Ur 0.4  0.0 - 1.9 mg/dL   Creatinine,U 162.9     Microalb Creat Ratio 0.2  0.0 - 30.0 mg/g    Assessment/Plan: Swelling of upper lip Physical exam unremarkable. Feel it is safe to resume Januvia. Possible new-onset food allergy.  Instructed patient to keep a food journal and monitor for recurrence of symptoms.  Recommended Allergist input but patient declines at present.  Alarm signs/symptoms iterated to patient.  Return to clinic if symptoms recur.

## 2013-10-20 NOTE — Assessment & Plan Note (Signed)
Physical exam unremarkable. Feel it is safe to resume Januvia. Possible new-onset food allergy.  Instructed patient to keep a food journal and monitor for recurrence of symptoms.  Recommended Allergist input but patient declines at present.  Alarm signs/symptoms iterated to patient.  Return to clinic if symptoms recur.

## 2013-12-17 ENCOUNTER — Other Ambulatory Visit: Payer: Self-pay | Admitting: Endocrinology

## 2014-01-15 ENCOUNTER — Other Ambulatory Visit: Payer: Self-pay | Admitting: Endocrinology

## 2014-01-19 LAB — HM DIABETES EYE EXAM

## 2014-02-10 ENCOUNTER — Encounter: Payer: Self-pay | Admitting: Physician Assistant

## 2014-02-14 ENCOUNTER — Ambulatory Visit (INDEPENDENT_AMBULATORY_CARE_PROVIDER_SITE_OTHER): Payer: BC Managed Care – PPO | Admitting: Endocrinology

## 2014-02-14 ENCOUNTER — Encounter: Payer: Self-pay | Admitting: Endocrinology

## 2014-02-14 VITALS — BP 112/62 | HR 65 | Temp 97.9°F | Ht 66.5 in | Wt 173.0 lb

## 2014-02-14 DIAGNOSIS — E1159 Type 2 diabetes mellitus with other circulatory complications: Secondary | ICD-10-CM

## 2014-02-14 LAB — BASIC METABOLIC PANEL
BUN: 13 mg/dL (ref 6–23)
CALCIUM: 9.5 mg/dL (ref 8.4–10.5)
CO2: 29 mEq/L (ref 19–32)
Chloride: 102 mEq/L (ref 96–112)
Creatinine, Ser: 0.95 mg/dL (ref 0.40–1.50)
GFR: 103.19 mL/min (ref >60.00)
GLUCOSE: 109 mg/dL — AB (ref 70–99)
Potassium: 3.8 mEq/L (ref 3.5–5.1)
Sodium: 139 mEq/L (ref 135–145)

## 2014-02-14 LAB — HEMOGLOBIN A1C: HEMOGLOBIN A1C: 6.9 % — AB (ref 4.6–6.5)

## 2014-02-14 MED ORDER — LINAGLIPTIN 5 MG PO TABS: 5.0000 mg | ORAL_TABLET | Freq: Every day | ORAL | Status: DC

## 2014-02-14 NOTE — Progress Notes (Signed)
Subjective:    Patient ID: [name], male    DOB: 07/29/1950, 64 y.o.   MRN: [id number]  HPI  Pt returns for f/u of diabetes mellitus: DM type: 2 Dx'ed: 9563 Complications: cerebrovascular disease Therapy: 3 oral meds DKA: never Severe hypoglycemia: never Pancreatitis: never Other: he can't take pioglitizone, due to edema Interval history: no cbg record, but states cbg's are well-controlled.  pt states he feels well in general. Past Medical History  Diagnosis Date  . History of bronchitis   . Hypertension   . Cerebrovascular disease, unspecified   . Other and unspecified hyperlipidemia     Hypercholesterolemia, Borderline  . Diabetes mellitus   . Testicular hypofunction   . Impotence of organic origin   . Knee pain     Right    Past Surgical History  Procedure Laterality Date  . Shoulder surgery Right 2004, 1990  . Wisdom tooth extraction      History   Social History  . Marital Status: Married    Spouse Name: [name]  . Number of Children: 0  . Years of Education: N/A   Occupational History  .     Social History Main Topics  . Smoking status: Former Smoker    Types: Cigarettes, Cigars    Quit date: 01/03/1978  . Smokeless tobacco: Never Used  . Alcohol Use: Yes     Comment: rare beer  . Drug Use: No  . Sexual Activity: Yes   Other Topics Concern  . Not on file   Social History Narrative   Regular exercise-yes   Caffeine-2 cups   Married=wife [name] 22 years   Cigar Smoker-quit smoking Jan 2010    Current Outpatient Prescriptions on File Prior to Visit  Medication Sig Dispense Refill  . acyclovir (ZOVIRAX) 800 MG tablet Take 800 mg by mouth daily.     [name] aspirin 81 MG tablet Take 81 mg by mouth daily.      [name] atenolol (TENORMIN) 50 MG tablet TAKE ONE TABLET BY MOUTH ONCE DAILY **REPLACES  BYSTOLIC** 90 tablet 1  . bromocriptine (PARLODEL) 2.5 MG tablet Take 1 tablet (2.5 mg total) by mouth at bedtime. 90 tablet 3  . hydrochlorothiazide  (HYDRODIURIL) 25 MG tablet Take 25 mg by mouth daily. Take 1 tablet by mouth twice daily.    . meloxicam (MOBIC) 7.5 MG tablet TAKE ONE TABLET BY MOUTH EVERY DAY AS NEEDED FOR  ARTHRITIS  PAIN 30 tablet 6  . metFORMIN (GLUCOPHAGE-XR) 500 MG 24 hr tablet TAKE TWO TABLETS BY MOUTH TWICE DAILY 120 tablet 0  . Multiple Vitamin (MULTIVITAMIN) tablet Take 1 tablet by mouth daily.      . rosuvastatin (CRESTOR) 5 MG tablet Take 1 tablet (5 mg total) by mouth daily. 90 tablet 3  . sildenafil (VIAGRA) 100 MG tablet Take 100 mg by mouth daily as needed. Use as directed     . Testosterone (ANDROGEL) 20.25 MG/1.25GM (1.62%) GEL Apply [2] pumps to each to shoulders every morning     No current facility-administered medications on file prior to visit.    Allergies  Allergen Reactions  . Actos [Pioglitazone]     edema  . Azithromycin     REACTION: rash  . Tetracycline     REACTION: rash  . Trandolapril     REACTION: ALLERGIC to ACEs w/ Angioedema  . Other Rash    MAGIC MOUTHWASH: CAUSES RASH    Family History  Problem Relation Age of Onset  . Heart disease  Mother 18    Deceased  . Diabetes Father 54    Deceased  . Heart disease Sister     heart trouble  . Diabetes Brother   . Colon cancer Neg Hx   . Heart attack Mother   . Hypertension Mother   . Hypertension Father   . Cancer Paternal Aunt     x1-Bone  . Cancer Paternal Aunt     multiple  . Heart attack Maternal Grandmother   . Healthy Sister     x6    BP 112/62 mmHg  Pulse 65  Temp(Src) 97.9 F (36.6 C) (Oral)  Ht 5' 6.5" (1.689 m)  Wt 173 lb (78.472 kg)  BMI 27.51 kg/m2  SpO2 97%   Review of Systems He denies hypoglycemia and weight change    Objective:   Physical Exam VITAL SIGNS:  See vs page GENERAL: no distress Pulses: dorsalis pedis intact bilat.   MSK: no deformity of the feet CV: 1+ bilat leg edema.  Skin:  no ulcer on the feet.  normal color and temp on the feet.   Neuro: sensation is intact to touch on  the feet.     Lab Results  Component Value Date   HGBA1C 6.9* 02/14/2014      Assessment & Plan:  DM: well-controlled.  Please continue the same medication, except change januvia to tradjenta.

## 2014-02-14 NOTE — Patient Instructions (Addendum)
blood tests are requested for you today.  We'll contact you with results. If it is high, we'll add "invokana." Please change januvia to tradjenta.  Here is a discount card. check your blood sugar once a day.  vary the time of day when you check, between before the 3 meals, and at bedtime.  also check if you have symptoms of your blood sugar being too high or too low.  please keep a record of the readings and bring it to your next appointment here.  please call us sooner if your blood sugar goes below 70, or if you have a lot of readings over 200. Please come back for a follow-up appointment in 6 months.

## 2014-03-14 ENCOUNTER — Telehealth: Payer: Self-pay | Admitting: *Deleted

## 2014-03-14 NOTE — Telephone Encounter (Signed)
Patient unavailable at time of pre-visit call.

## 2014-03-17 ENCOUNTER — Ambulatory Visit (INDEPENDENT_AMBULATORY_CARE_PROVIDER_SITE_OTHER): Payer: BLUE CROSS/BLUE SHIELD | Admitting: Physician Assistant

## 2014-03-17 ENCOUNTER — Encounter: Payer: Self-pay | Admitting: Physician Assistant

## 2014-03-17 VITALS — BP 136/80 | HR 57 | Temp 97.9°F | Resp 16 | Ht 66.5 in | Wt 169.2 lb

## 2014-03-17 DIAGNOSIS — Z0000 Encounter for general adult medical examination without abnormal findings: Secondary | ICD-10-CM

## 2014-03-17 DIAGNOSIS — Z136 Encounter for screening for cardiovascular disorders: Secondary | ICD-10-CM | POA: Insufficient documentation

## 2014-03-17 DIAGNOSIS — I10 Essential (primary) hypertension: Secondary | ICD-10-CM

## 2014-03-17 LAB — CBC
HCT: 40.1 % (ref 39.0–52.0)
Hemoglobin: 13.2 g/dL (ref 13.0–17.0)
MCHC: 32.9 g/dL (ref 30.0–36.0)
MCV: 79.3 fl (ref 78.0–100.0)
PLATELETS: 238 10*3/uL (ref 150.0–400.0)
RBC: 5.05 Mil/uL (ref 4.22–5.81)
RDW: 17 % — ABNORMAL HIGH (ref 11.5–15.5)
WBC: 5.8 10*3/uL (ref 4.0–10.5)

## 2014-03-17 LAB — LIPID PANEL
Cholesterol: 200 mg/dL (ref 0–200)
HDL: 55.1 mg/dL (ref >39.00)
LDL Cholesterol: 132 mg/dL — ABNORMAL HIGH (ref 0–99)
NONHDL: 144.9
Total CHOL/HDL Ratio: 4
Triglycerides: 66 mg/dL (ref 0.0–149.0)
VLDL: 13.2 mg/dL (ref 0.0–40.0)

## 2014-03-17 LAB — TSH: TSH: 1.16 u[IU]/mL (ref 0.35–4.50)

## 2014-03-17 LAB — HEPATIC FUNCTION PANEL
ALT: 33 U/L (ref 0–53)
AST: 26 U/L (ref 0–37)
Albumin: 4.1 g/dL (ref 3.5–5.2)
Alkaline Phosphatase: 56 U/L (ref 39–117)
BILIRUBIN TOTAL: 0.4 mg/dL (ref 0.2–1.2)
Bilirubin, Direct: 0.1 mg/dL (ref 0.0–0.3)
Total Protein: 6.5 g/dL (ref 6.0–8.3)

## 2014-03-17 LAB — PSA: PSA: 1.23 ng/mL (ref 0.10–4.00)

## 2014-03-17 MED ORDER — ATENOLOL 50 MG PO TABS: ORAL_TABLET | ORAL | Status: DC

## 2014-03-17 MED ORDER — HYDROCHLOROTHIAZIDE 25 MG PO TABS: 25.0000 mg | ORAL_TABLET | Freq: Every day | ORAL | Status: DC

## 2014-03-17 NOTE — Patient Instructions (Signed)
Please continue medications as directed. Stop by the lab for blood work. I will call you with your results.  Follow-up will be based on results.  Preventive Care for Adults A healthy lifestyle and preventive care can promote health and wellness. Preventive health guidelines for men include the following key practices:  A routine yearly physical is a good way to check with your health care provider about your health and preventative screening. It is a chance to share any concerns and updates on your health and to receive a thorough exam.  Visit your dentist for a routine exam and preventative care every 6 months. Brush your teeth twice a day and floss once a day. Good oral hygiene prevents tooth decay and gum disease.  The frequency of eye exams is based on your age, health, family medical history, use of contact lenses, and other factors. Follow your health care provider's recommendations for frequency of eye exams.  Eat a healthy diet. Foods such as vegetables, fruits, whole grains, low-fat dairy products, and lean protein foods contain the nutrients you need without too many calories. Decrease your intake of foods high in solid fats, added sugars, and salt. Eat the right amount of calories for you.Get information about a proper diet from your health care provider, if necessary.  Regular physical exercise is one of the most important things you can do for your health. Most adults should get at least 150 minutes of moderate-intensity exercise (any activity that increases your heart rate and causes you to sweat) each week. In addition, most adults need muscle-strengthening exercises on 2 or more days a week.  Maintain a healthy weight. The body mass index (BMI) is a screening tool to identify possible weight problems. It provides an estimate of body fat based on height and weight. Your health care provider can find your BMI and can help you achieve or maintain a healthy weight.For adults 20 years  and older:  A BMI below 18.5 is considered underweight.  A BMI of 18.5 to 24.9 is normal.  A BMI of 25 to 29.9 is considered overweight.  A BMI of 30 and above is considered obese.  Maintain normal blood lipids and cholesterol levels by exercising and minimizing your intake of saturated fat. Eat a balanced diet with plenty of fruit and vegetables. Blood tests for lipids and cholesterol should begin at age 39 and be repeated every 5 years. If your lipid or cholesterol levels are high, you are over 50, or you are at high risk for heart disease, you may need your cholesterol levels checked more frequently.Ongoing high lipid and cholesterol levels should be treated with medicines if diet and exercise are not working.  If you smoke, find out from your health care provider how to quit. If you do not use tobacco, do not start.  Lung cancer screening is recommended for adults aged 103-80 years who are at high risk for developing lung cancer because of a history of smoking. A yearly low-dose CT scan of the lungs is recommended for people who have at least a 30-pack-year history of smoking and are a current smoker or have quit within the past 15 years. A pack year of smoking is smoking an average of 1 pack of cigarettes a day for 1 year (for example: 1 pack a day for 30 years or 2 packs a day for 15 years). Yearly screening should continue until the smoker has stopped smoking for at least 15 years. Yearly screening should be stopped for  people who develop a health problem that would prevent them from having lung cancer treatment.  If you choose to drink alcohol, do not have more than 2 drinks per day. One drink is considered to be 12 ounces (355 mL) of beer, 5 ounces (148 mL) of wine, or 1.5 ounces (44 mL) of liquor.  Avoid use of street drugs. Do not share needles with anyone. Ask for help if you need support or instructions about stopping the use of drugs.  High blood pressure causes heart disease and  increases the risk of stroke. Your blood pressure should be checked at least every 1-2 years. Ongoing high blood pressure should be treated with medicines, if weight loss and exercise are not effective.  If you are 81-40 years old, ask your health care provider if you should take aspirin to prevent heart disease.  Diabetes screening involves taking a blood sample to check your fasting blood sugar level. This should be done once every 3 years, after age 64, if you are within normal weight and without risk factors for diabetes. Testing should be considered at a younger age or be carried out more frequently if you are overweight and have at least 1 risk factor for diabetes.  Colorectal cancer can be detected and often prevented. Most routine colorectal cancer screening begins at the age of 59 and continues through age 12. However, your health care provider may recommend screening at an earlier age if you have risk factors for colon cancer. On a yearly basis, your health care provider may provide home test kits to check for hidden blood in the stool. Use of a small camera at the end of a tube to directly examine the colon (sigmoidoscopy or colonoscopy) can detect the earliest forms of colorectal cancer. Talk to your health care provider about this at age 24, when routine screening begins. Direct exam of the colon should be repeated every 5-10 years through age 43, unless early forms of precancerous polyps or small growths are found.  People who are at an increased risk for hepatitis B should be screened for this virus. You are considered at high risk for hepatitis B if:  You were born in a country where hepatitis B occurs often. Talk with your health care provider about which countries are considered high risk.  Your parents were born in a high-risk country and you have not received a shot to protect against hepatitis B (hepatitis B vaccine).  You have HIV or AIDS.  You use needles to inject street  drugs.  You live with, or have sex with, someone who has hepatitis B.  You are a man who has sex with other men (MSM).  You get hemodialysis treatment.  You take certain medicines for conditions such as cancer, organ transplantation, and autoimmune conditions.  Hepatitis C blood testing is recommended for all people born from [birth year] through 1965 and any individual with known risks for hepatitis C.  Practice safe sex. Use condoms and avoid high-risk sexual practices to reduce the spread of sexually transmitted infections (STIs). STIs include gonorrhea, chlamydia, syphilis, trichomonas, herpes, HPV, and human immunodeficiency virus (HIV). Herpes, HIV, and HPV are viral illnesses that have no cure. They can result in disability, cancer, and death.  If you are at risk of being infected with HIV, it is recommended that you take a prescription medicine daily to prevent HIV infection. This is called preexposure prophylaxis (PrEP). You are considered at risk if:  You are a man who has  sex with other men (MSM) and have other risk factors.  You are a heterosexual man, are sexually active, and are at increased risk for HIV infection.  You take drugs by injection.  You are sexually active with a partner who has HIV.  Talk with your health care provider about whether you are at high risk of being infected with HIV. If you choose to begin PrEP, you should first be tested for HIV. You should then be tested every 3 months for as long as you are taking PrEP.  A one-time screening for abdominal aortic aneurysm (AAA) and surgical repair of large AAAs by ultrasound are recommended for men ages 31 to 38 years who are current or former smokers.  Healthy men should no longer receive prostate-specific antigen (PSA) blood tests as part of routine cancer screening. Talk with your health care provider about prostate cancer screening.  Testicular cancer screening is not recommended for adult males who have no  symptoms. Screening includes self-exam, a health care provider exam, and other screening tests. Consult with your health care provider about any symptoms you have or any concerns you have about testicular cancer.  Use sunscreen. Apply sunscreen liberally and repeatedly throughout the day. You should seek shade when your shadow is shorter than you. Protect yourself by wearing long sleeves, pants, a wide-brimmed hat, and sunglasses year round, whenever you are outdoors.  Once a month, do a whole-body skin exam, using a mirror to look at the skin on your back. Tell your health care provider about new moles, moles that have irregular borders, moles that are larger than a pencil eraser, or moles that have changed in shape or color.  Stay current with required vaccines (immunizations).  Influenza vaccine. All adults should be immunized every year.  Tetanus, diphtheria, and acellular pertussis (Td, Tdap) vaccine. An adult who has not previously received Tdap or who does not know his vaccine status should receive 1 dose of Tdap. This initial dose should be followed by tetanus and diphtheria toxoids (Td) booster doses every 10 years. Adults with an unknown or incomplete history of completing a 3-dose immunization series with Td-containing vaccines should begin or complete a primary immunization series including a Tdap dose. Adults should receive a Td booster every 10 years.  Varicella vaccine. An adult without evidence of immunity to varicella should receive 2 doses or a second dose if he has previously received 1 dose.  Human papillomavirus (HPV) vaccine. Males aged 23-21 years who have not received the vaccine previously should receive the 3-dose series. Males aged 22-26 years may be immunized. Immunization is recommended through the age of 46 years for any male who has sex with males and did not get any or all doses earlier. Immunization is recommended for any person with an immunocompromised condition  through the age of 28 years if he did not get any or all doses earlier. During the 3-dose series, the second dose should be obtained 4-8 weeks after the first dose. The third dose should be obtained 24 weeks after the first dose and 16 weeks after the second dose.  Zoster vaccine. One dose is recommended for adults aged 55 years or older unless certain conditions are present.  Measles, mumps, and rubella (MMR) vaccine. Adults born before [birth year] generally are considered immune to measles and mumps. Adults born in [birth year] or later should have 1 or more doses of MMR vaccine unless there is a contraindication to the vaccine or there is laboratory evidence of immunity  to each of the three diseases. A routine second dose of MMR vaccine should be obtained at least 28 days after the first dose for students attending postsecondary schools, health care workers, or international travelers. People who received inactivated measles vaccine or an unknown type of measles vaccine during 1963-1967 should receive 2 doses of MMR vaccine. People who received inactivated mumps vaccine or an unknown type of mumps vaccine before 1979 and are at high risk for mumps infection should consider immunization with 2 doses of MMR vaccine. Unvaccinated health care workers born before [birth year] who lack laboratory evidence of measles, mumps, or rubella immunity or laboratory confirmation of disease should consider measles and mumps immunization with 2 doses of MMR vaccine or rubella immunization with 1 dose of MMR vaccine.  Pneumococcal 13-valent conjugate (PCV13) vaccine. When indicated, a person who is uncertain of his immunization history and has no record of immunization should receive the PCV13 vaccine. An adult aged 1 years or older who has certain medical conditions and has not been previously immunized should receive 1 dose of PCV13 vaccine. This PCV13 should be followed with a dose of pneumococcal polysaccharide (PPSV23) vaccine. The PPSV23  vaccine dose should be obtained at least 8 weeks after the dose of PCV13 vaccine. An adult aged 68 years or older who has certain medical conditions and previously received 1 or more doses of PPSV23 vaccine should receive 1 dose of PCV13. The PCV13 vaccine dose should be obtained 1 or more years after the last PPSV23 vaccine dose.  Pneumococcal polysaccharide (PPSV23) vaccine. When PCV13 is also indicated, PCV13 should be obtained first. All adults aged 44 years and older should be immunized. An adult younger than age 62 years who has certain medical conditions should be immunized. Any person who resides in a nursing home or long-term care facility should be immunized. An adult smoker should be immunized. People with an immunocompromised condition and certain other conditions should receive both PCV13 and PPSV23 vaccines. People with human immunodeficiency virus (HIV) infection should be immunized as soon as possible after diagnosis. Immunization during chemotherapy or radiation therapy should be avoided. Routine use of PPSV23 vaccine is not recommended for American Indians, [place] Natives, or people younger than 65 years unless there are medical conditions that require PPSV23 vaccine. When indicated, people who have unknown immunization and have no record of immunization should receive PPSV23 vaccine. One-time revaccination 5 years after the first dose of PPSV23 is recommended for people aged 19-64 years who have chronic kidney failure, nephrotic syndrome, asplenia, or immunocompromised conditions. People who received 1-2 doses of PPSV23 before age 74 years should receive another dose of PPSV23 vaccine at age 39 years or later if at least 5 years have passed since the previous dose. Doses of PPSV23 are not needed for people immunized with PPSV23 at or after age 19 years.  Meningococcal vaccine. Adults with asplenia or persistent complement component deficiencies should receive 2 doses of quadrivalent  meningococcal conjugate (MenACWY-D) vaccine. The doses should be obtained at least 2 months apart. Microbiologists working with certain meningococcal bacteria, [place] recruits, people at risk during an outbreak, and people who travel to or live in countries with a high rate of meningitis should be immunized. A first-year college student up through age 109 years who is living in a residence hall should receive a dose if he did not receive a dose on or after his 16th birthday. Adults who have certain high-risk conditions should receive one or more doses of vaccine.  Hepatitis A vaccine. Adults who wish to be protected from this disease, have certain high-risk conditions, work with hepatitis A-infected animals, work in hepatitis A research labs, or travel to or work in countries with a high rate of hepatitis A should be immunized. Adults who were previously unvaccinated and who anticipate close contact with an international adoptee during the first 60 days after arrival in the [place] from a country with a high rate of hepatitis A should be immunized.  Hepatitis B vaccine. Adults should be immunized if they wish to be protected from this disease, have certain high-risk conditions, may be exposed to blood or other infectious body fluids, are household contacts or sex partners of hepatitis B positive people, are clients or workers in certain care facilities, or travel to or work in countries with a high rate of hepatitis B.  Haemophilus influenzae type b (Hib) vaccine. A previously unvaccinated person with asplenia or sickle cell disease or having a scheduled splenectomy should receive 1 dose of Hib vaccine. Regardless of previous immunization, a recipient of a hematopoietic stem cell transplant should receive a 3-dose series 6-12 months after his successful transplant. Hib vaccine is not recommended for adults with HIV infection. Preventive Service / Frequency Ages 44 to 48  Blood pressure check.** /  Every 1 to 2 years.  Lipid and cholesterol check.** / Every 5 years beginning at age 60.  Hepatitis C blood test.** / For any individual with known risks for hepatitis C.  Skin self-exam. / Monthly.  Influenza vaccine. / Every year.  Tetanus, diphtheria, and acellular pertussis (Tdap, Td) vaccine.** / Consult your health care provider. 1 dose of Td every 10 years.  Varicella vaccine.** / Consult your health care provider.  HPV vaccine. / 3 doses over 6 months, if 3 or younger.  Measles, mumps, rubella (MMR) vaccine.** / You need at least 1 dose of MMR if you were born in 1957 or later. You may also need a second dose.  Pneumococcal 13-valent conjugate (PCV13) vaccine.** / Consult your health care provider.  Pneumococcal polysaccharide (PPSV23) vaccine.** / 1 to 2 doses if you smoke cigarettes or if you have certain conditions.  Meningococcal vaccine.** / 1 dose if you are age 19 to 33 years and a Market researcher living in a residence hall, or have one of several medical conditions. You may also need additional booster doses.  Hepatitis A vaccine.** / Consult your health care provider.  Hepatitis B vaccine.** / Consult your health care provider.  Haemophilus influenzae type b (Hib) vaccine.** / Consult your health care provider. Ages 66 to 23  Blood pressure check.** / Every 1 to 2 years.  Lipid and cholesterol check.** / Every 5 years beginning at age 59.  Lung cancer screening. / Every year if you are aged 43-80 years and have a 30-pack-year history of smoking and currently smoke or have quit within the past 15 years. Yearly screening is stopped once you have quit smoking for at least 15 years or develop a health problem that would prevent you from having lung cancer treatment.  Fecal occult blood test (FOBT) of stool. / Every year beginning at age 28 and continuing until age 58. You may not have to do this test if you get a colonoscopy every 10 years.  Flexible  sigmoidoscopy** or colonoscopy.** / Every 5 years for a flexible sigmoidoscopy or every 10 years for a colonoscopy beginning at age 66 and continuing until age 40.  Hepatitis C blood test.** /  For all people born from [birth year] through 1964 and any individual with known risks for hepatitis C.  Skin self-exam. / Monthly.  Influenza vaccine. / Every year.  Tetanus, diphtheria, and acellular pertussis (Tdap/Td) vaccine.** / Consult your health care provider. 1 dose of Td every 10 years.  Varicella vaccine.** / Consult your health care provider.  Zoster vaccine.** / 1 dose for adults aged 56 years or older.  Measles, mumps, rubella (MMR) vaccine.** / You need at least 1 dose of MMR if you were born in 1956 or later. You may also need a second dose.  Pneumococcal 13-valent conjugate (PCV13) vaccine.** / Consult your health care provider.  Pneumococcal polysaccharide (PPSV23) vaccine.** / 1 to 2 doses if you smoke cigarettes or if you have certain conditions.  Meningococcal vaccine.** / Consult your health care provider.  Hepatitis A vaccine.** / Consult your health care provider.  Hepatitis B vaccine.** / Consult your health care provider.  Haemophilus influenzae type b (Hib) vaccine.** / Consult your health care provider. Ages 63 and over  Blood pressure check.** / Every 1 to 2 years.  Lipid and cholesterol check.**/ Every 5 years beginning at age 26.  Lung cancer screening. / Every year if you are aged 50-80 years and have a 30-pack-year history of smoking and currently smoke or have quit within the past 15 years. Yearly screening is stopped once you have quit smoking for at least 15 years or develop a health problem that would prevent you from having lung cancer treatment.  Fecal occult blood test (FOBT) of stool. / Every year beginning at age 52 and continuing until age 76. You may not have to do this test if you get a colonoscopy every 10 years.  Flexible sigmoidoscopy** or  colonoscopy.** / Every 5 years for a flexible sigmoidoscopy or every 10 years for a colonoscopy beginning at age 95 and continuing until age 44.  Hepatitis C blood test.** / For all people born from [birth year] through 1964 and any individual with known risks for hepatitis C.  Abdominal aortic aneurysm (AAA) screening.** / A one-time screening for ages 2 to 57 years who are current or former smokers.  Skin self-exam. / Monthly.  Influenza vaccine. / Every year.  Tetanus, diphtheria, and acellular pertussis (Tdap/Td) vaccine.** / 1 dose of Td every 10 years.  Varicella vaccine.** / Consult your health care provider.  Zoster vaccine.** / 1 dose for adults aged 77 years or older.  Pneumococcal 13-valent conjugate (PCV13) vaccine.** / Consult your health care provider.  Pneumococcal polysaccharide (PPSV23) vaccine.** / 1 dose for all adults aged 20 years and older.  Meningococcal vaccine.** / Consult your health care provider.  Hepatitis A vaccine.** / Consult your health care provider.  Hepatitis B vaccine.** / Consult your health care provider.  Haemophilus influenzae type b (Hib) vaccine.** / Consult your health care provider. **Family history and personal history of risk and conditions may change your health care provider's recommendations. Document Released: 02/15/2001 Document Revised: 12/25/2012 Document Reviewed: 05/17/2010 [facility] Patient Information 2015 [place], [place]. This information is not intended to replace advice given to you by your health care provider. Make sure you discuss any questions you have with your health care provider.

## 2014-03-17 NOTE — Assessment & Plan Note (Signed)
Well-controlled.  Asymptomatic.  Continue current regimen.  Medications refilled.

## 2014-03-17 NOTE — Progress Notes (Signed)
Pre visit review using our clinic review tool, if applicable. No additional management support is needed unless otherwise documented below in the visit note/SLS  

## 2014-03-17 NOTE — Progress Notes (Signed)
Patient presents to clinic today for annual exam.  Patient is fasting for labs.  Denies acute concerns today.  Is followed by Endocrinology for diabetes and Urology for ED. Endorses taking medications as directed with exception of Crestor which he has recently stopped taking. Is watching diet and exercising more.  Health Maintenance: Dental -- up-to-date Vision -- up-to-date Immunizations -- Flu shot up-to-date. Tetanus up-to-date.  Declines Pneumonia vaccine. Colonoscopy -- up-to-date   Past Medical History  Diagnosis Date  . History of bronchitis   . Hypertension   . Cerebrovascular disease, unspecified   . Other and unspecified hyperlipidemia     Hypercholesterolemia, Borderline  . Diabetes mellitus   . Testicular hypofunction   . Impotence of organic origin   . Knee pain     Right    Past Surgical History  Procedure Laterality Date  . Shoulder surgery Right 2004, 1990  . Wisdom tooth extraction      Current Outpatient Prescriptions on File Prior to Visit  Medication Sig Dispense Refill  . acyclovir (ZOVIRAX) 800 MG tablet Take 800 mg by mouth daily.     [name] aspirin 81 MG tablet Take 81 mg by mouth daily.      [name] atenolol (TENORMIN) 50 MG tablet TAKE ONE TABLET BY MOUTH ONCE DAILY **REPLACES  BYSTOLIC** 90 tablet 1  . bromocriptine (PARLODEL) 2.5 MG tablet Take 1 tablet (2.5 mg total) by mouth at bedtime. 90 tablet 3  . hydrochlorothiazide (HYDRODIURIL) 25 MG tablet Take 25 mg by mouth daily.     [name] linagliptin (TRADJENTA) 5 MG TABS tablet Take 1 tablet (5 mg total) by mouth daily. 30 tablet 11  . meloxicam (MOBIC) 7.5 MG tablet TAKE ONE TABLET BY MOUTH EVERY DAY AS NEEDED FOR  ARTHRITIS  PAIN 30 tablet 6  . metFORMIN (GLUCOPHAGE-XR) 500 MG 24 hr tablet TAKE TWO TABLETS BY MOUTH TWICE DAILY 120 tablet 0  . Multiple Vitamin (MULTIVITAMIN) tablet Take 1 tablet by mouth daily.      . sildenafil (VIAGRA) 100 MG tablet Take 100 mg by mouth daily as needed. Use as directed       . Testosterone (ANDROGEL) 20.25 MG/1.25GM (1.62%) GEL Apply [2] pumps to each to shoulders every morning    . rosuvastatin (CRESTOR) 5 MG tablet Take 1 tablet (5 mg total) by mouth daily. (Patient not taking: Reported on 03/17/2014) 90 tablet 3   No current facility-administered medications on file prior to visit.    Allergies  Allergen Reactions  . Actos [Pioglitazone]     edema  . Azithromycin     REACTION: rash  . Tetracycline     REACTION: rash  . Trandolapril     REACTION: ALLERGIC to ACEs w/ Angioedema  . Other Rash    MAGIC MOUTHWASH: CAUSES RASH    Family History  Problem Relation Age of Onset  . Heart disease Mother 72    Deceased  . Diabetes Father 52    Deceased  . Heart disease Sister     heart trouble  . Diabetes Brother   . Colon cancer Neg Hx   . Heart attack Mother   . Hypertension Mother   . Hypertension Father   . Cancer Paternal Aunt     x1-Bone  . Cancer Paternal Aunt     multiple  . Heart attack Maternal Grandmother   . Healthy Sister     x6    History   Social History  . Marital Status: Married  Spouse Name: [name]  . Number of Children: 0  . Years of Education: N/A   Occupational History  .     Social History Main Topics  . Smoking status: Never Smoker   . Smokeless tobacco: Not on file  . Alcohol Use: 0.0 oz/week    0 Standard drinks or equivalent per week     Comment: rare beer  . Drug Use: No  . Sexual Activity: Yes   Other Topics Concern  . Not on file   Social History Narrative   Regular exercise-yes   Caffeine-2 cups   Married=wife [name] 22 years   Cigar Smoker-quit smoking Jan 2010   Review of Systems  Constitutional: Negative for fever and weight loss.  HENT: Negative for ear discharge, ear pain, hearing loss and tinnitus.   Eyes: Negative for blurred vision, double vision, photophobia and pain.  Respiratory: Negative for cough and shortness of breath.   Cardiovascular: Negative for chest pain and  palpitations.  Gastrointestinal: Negative for heartburn, nausea, vomiting, abdominal pain, diarrhea, constipation, blood in stool and melena.  Genitourinary: Negative for dysuria, urgency, frequency, hematuria and flank pain.  Musculoskeletal: Negative for falls.  Neurological: Negative for dizziness, loss of consciousness and headaches.  Endo/Heme/Allergies: Negative for environmental allergies.  Psychiatric/Behavioral: Negative for depression, suicidal ideas, hallucinations and substance abuse. The patient is not nervous/anxious and does not have insomnia.    BP 136/80 mmHg  Pulse 57  Temp(Src) 97.9 F (36.6 C) (Oral)  Resp 16  Ht 5' 6.5" (1.689 m)  Wt 169 lb 4 oz (76.771 kg)  BMI 26.91 kg/m2  SpO2 100%  Physical Exam  Constitutional: He is oriented to person, place, and time and well-developed, well-nourished, and in no distress.  HENT:  Head: Normocephalic and atraumatic.  Right Ear: External ear normal.  Left Ear: External ear normal.  Nose: Nose normal.  Mouth/Throat: Oropharynx is clear and moist. No oropharyngeal exudate.  Eyes: Conjunctivae and EOM are normal. Pupils are equal, round, and reactive to light.  Neck: Neck supple. No thyromegaly present.  Cardiovascular: Normal rate, regular rhythm, normal heart sounds and intact distal pulses.   Pulmonary/Chest: Effort normal and breath sounds normal. No respiratory distress. He has no wheezes. He has no rales. He exhibits no tenderness.  Abdominal: Soft. Bowel sounds are normal. He exhibits no distension and no mass. There is no tenderness. There is no rebound and no guarding.  Genitourinary: Testes/scrotum normal and penis normal. No discharge found.  Lymphadenopathy:    He has no cervical adenopathy.  Neurological: He is alert and oriented to person, place, and time.  Skin: Skin is warm and dry. No rash noted.  Psychiatric: Affect normal.  Vitals reviewed.  Recent Results (from the past 2160 hour(s))  [facility] DIABETES EYE  EXAM     Status: None   Collection Time: 01/19/14 12:00 AM  Result Value Ref Range   HM Diabetic Eye Exam No Retinopathy No Retinopathy  Hemoglobin A1c     Status: Abnormal   Collection Time: 02/14/14  8:20 AM  Result Value Ref Range   Hgb A1c MFr Bld 6.9 (H) 4.6 - 6.5 %    Comment: Glycemic Control Guidelines for People with Diabetes:Non Diabetic:  <6%Goal of Therapy: <7%Additional Action Suggested:  >7%   Basic metabolic panel     Status: Abnormal   Collection Time: 02/14/14  8:20 AM  Result Value Ref Range   Sodium 139 135 - 145 mEq/L   Potassium 3.8 3.5 - 5.1 mEq/L  Chloride 102 96 - 112 mEq/L   CO2 29 19 - 32 mEq/L   Glucose, Bld 109 (H) 70 - 99 mg/dL   BUN 13 6 - 23 mg/dL   Creatinine, Ser 0.95 0.40 - 1.50 mg/dL   Calcium 9.5 8.4 - 10.5 mg/dL   GFR 103.19 >60.00 mL/min    Assessment/Plan: No problem-specific assessment & plan notes found for this encounter.

## 2014-03-17 NOTE — Assessment & Plan Note (Signed)
I have reviewed the patient's medical history in detail and updated the computerized patient record. Health Maintenance up-to-date.  Declines pneumonia vaccination.  Wishes to proceed with PSA testing. PHQ-2 depression screen negative. Preventive care discussed.  Handout given. Will obtain fasting labs today.

## 2014-03-17 NOTE — Assessment & Plan Note (Signed)
EKG reveals NSR. Continue Asa 81 mg and antihypertensive regimen. Will obtain fasting labs today.  Discussed restarting Crestor pending results.  Continue diet and exercise.

## 2014-03-18 LAB — HIV ANTIBODY (ROUTINE TESTING W REFLEX): HIV: NONREACTIVE

## 2014-03-19 LAB — URINALYSIS, ROUTINE W REFLEX MICROSCOPIC
Bilirubin Urine: NEGATIVE
HGB URINE DIPSTICK: NEGATIVE
Ketones, ur: NEGATIVE
LEUKOCYTES UA: NEGATIVE
NITRITE: NEGATIVE
RBC / HPF: NONE SEEN (ref 0–?)
Specific Gravity, Urine: 1.025 (ref 1.000–1.030)
Total Protein, Urine: NEGATIVE
UROBILINOGEN UA: 0.2 (ref 0.0–1.0)
Urine Glucose: NEGATIVE
WBC, UA: NONE SEEN (ref 0–?)
pH: 5.5 (ref 5.0–8.0)

## 2014-03-19 MED ORDER — ROSUVASTATIN CALCIUM 5 MG PO TABS: 5.0000 mg | ORAL_TABLET | Freq: Every day | ORAL | Status: DC

## 2014-03-19 NOTE — Addendum Note (Signed)
Addended by: Peggyann Shoals on: 03/19/2014 10:33 AM   Modules accepted: Orders

## 2014-04-14 ENCOUNTER — Other Ambulatory Visit: Payer: Self-pay | Admitting: Endocrinology

## 2014-06-10 ENCOUNTER — Other Ambulatory Visit: Payer: Self-pay | Admitting: Endocrinology

## 2014-07-14 ENCOUNTER — Encounter: Payer: Self-pay | Admitting: Physician Assistant

## 2014-07-14 ENCOUNTER — Ambulatory Visit (INDEPENDENT_AMBULATORY_CARE_PROVIDER_SITE_OTHER): Payer: BLUE CROSS/BLUE SHIELD | Admitting: Physician Assistant

## 2014-07-14 VITALS — BP 141/77 | HR 64 | Temp 97.7°F | Ht 66.5 in | Wt 175.0 lb

## 2014-07-14 DIAGNOSIS — T783XXD Angioneurotic edema, subsequent encounter: Secondary | ICD-10-CM

## 2014-07-14 DIAGNOSIS — T783XXA Angioneurotic edema, initial encounter: Secondary | ICD-10-CM | POA: Insufficient documentation

## 2014-07-14 DIAGNOSIS — L500 Allergic urticaria: Secondary | ICD-10-CM | POA: Diagnosis not present

## 2014-07-14 NOTE — Patient Instructions (Signed)
Please keep a food journal. Wash all sheets in hot water. Try to limit new soaps, lotions or detergents.   Keep Benadryl on had in case of recurrence. If swelling is associated with shortness of breath, please call 911.   You will be contacted for assessment by an Allergist.  Call if you have not heard from them by Wednesday at lunch.

## 2014-07-14 NOTE — Assessment & Plan Note (Signed)
Treated at [facility]. Asymptomatic at present. Discussed probable triggers to avoid. Encouraged to keep a food journal. Keep Benadryl on hand in case of recurrence. Urgent referral placed to Allergist. Alarm signs/symptoms discussed with patient that would prompt 911 call.

## 2014-07-14 NOTE — Progress Notes (Signed)
Patient presents to clinic today requesting referral to Allergist as recommended by Urgent Care MD. Patient was seen at a [facility] 2 weeks ago for angioedema and hives stemming from unknown allergies.  Patient denies new food, soaps, lotions or detergents. Was given oral steroid pack and endorses he took as directed with resolution of symptoms. Denies. Patient is not on ACE/ARB. [name]  Past Medical History  Diagnosis Date  . History of bronchitis   . Hypertension   . Cerebrovascular disease, unspecified   . Other and unspecified hyperlipidemia     Hypercholesterolemia, Borderline  . Diabetes mellitus   . Testicular hypofunction   . Impotence of organic origin   . Knee pain     Right    Current Outpatient Prescriptions on File Prior to Visit  Medication Sig Dispense Refill  . acyclovir (ZOVIRAX) 800 MG tablet Take 800 mg by mouth daily.     [name] aspirin 81 MG tablet Take 81 mg by mouth daily.      [name] atenolol (TENORMIN) 50 MG tablet TAKE ONE TABLET BY MOUTH ONCE DAILY **REPLACES  BYSTOLIC** 90 tablet 1  . bromocriptine (PARLODEL) 2.5 MG tablet Take 1 tablet (2.5 mg total) by mouth at bedtime. 90 tablet 3  . hydrochlorothiazide (HYDRODIURIL) 25 MG tablet Take 1 tablet (25 mg total) by mouth daily. 90 tablet 1  . linagliptin (TRADJENTA) 5 MG TABS tablet Take 1 tablet (5 mg total) by mouth daily. 30 tablet 11  . meloxicam (MOBIC) 7.5 MG tablet TAKE ONE TABLET BY MOUTH EVERY DAY AS NEEDED FOR  ARTHRITIS  PAIN 30 tablet 6  . metFORMIN (GLUCOPHAGE-XR) 500 MG 24 hr tablet TAKE TWO TABLETS BY MOUTH TWICE DAILY 120 tablet 0  . Multiple Vitamin (MULTIVITAMIN) tablet Take 1 tablet by mouth daily.      . rosuvastatin (CRESTOR) 5 MG tablet Take 1 tablet (5 mg total) by mouth daily. 30 tablet 3  . sildenafil (VIAGRA) 100 MG tablet Take 100 mg by mouth daily as needed. Use as directed     . Testosterone (ANDROGEL) 20.25 MG/1.25GM (1.62%) GEL Apply [2] pumps to each to shoulders every morning     No  current facility-administered medications on file prior to visit.    Allergies  Allergen Reactions  . Actos [Pioglitazone]     edema  . Azithromycin     REACTION: rash  . Tetracycline     REACTION: rash  . Trandolapril     REACTION: ALLERGIC to ACEs w/ Angioedema  . Other Rash    MAGIC MOUTHWASH: CAUSES RASH    Family History  Problem Relation Age of Onset  . Heart disease Mother 33    Deceased  . Diabetes Father 66    Deceased  . Heart disease Sister     heart trouble  . Diabetes Brother   . Colon cancer Neg Hx   . Heart attack Mother   . Hypertension Mother   . Hypertension Father   . Cancer Paternal Aunt     x1-Bone  . Cancer Paternal Aunt     multiple  . Heart attack Maternal Grandmother   . Healthy Sister     x6    History   Social History  . Marital Status: Married    Spouse Name: [name]  . Number of Children: 0  . Years of Education: N/A   Occupational History  .     Social History Main Topics  . Smoking status: Never Smoker   .  Smokeless tobacco: Never Used  . Alcohol Use: 0.0 oz/week    0 Standard drinks or equivalent per week     Comment: rare beer  . Drug Use: No  . Sexual Activity: Yes   Other Topics Concern  . None   Social History Narrative   Regular exercise-yes   Caffeine-2 cups   Married=wife [name] 22 years   Cigar Smoker-quit smoking Jan 2010   Review of Systems - See HPI.  All other ROS are negative.  BP 141/77 mmHg  Pulse 64  Temp(Src) 97.7 F (36.5 C) (Oral)  Ht 5' 6.5" (1.689 m)  Wt 175 lb (79.379 kg)  BMI 27.83 kg/m2  SpO2 100%  Physical Exam  Constitutional: He is oriented to person, place, and time and well-developed, well-nourished, and in no distress.  HENT:  Head: Normocephalic and atraumatic.  Eyes: Conjunctivae are normal.  Cardiovascular: Normal rate, regular rhythm, normal heart sounds and intact distal pulses.   Pulmonary/Chest: Effort normal and breath sounds normal. No respiratory distress. He has  no wheezes. He has no rales. He exhibits no tenderness.  Neurological: He is alert and oriented to person, place, and time.  Skin: Skin is warm and dry. No rash noted.  Psychiatric: Affect normal.  Vitals reviewed.   No results found for this or any previous visit (from the past 2160 hour(s)).  Assessment/Plan: Angioedema of lips Treated at UC. Asymptomatic at present. Discussed probable triggers to avoid. Encouraged to keep a food journal. Keep Benadryl on hand in case of recurrence. Urgent referral placed to Allergist. Alarm signs/symptoms discussed with patient that would prompt 911 call.

## 2014-07-14 NOTE — Progress Notes (Signed)
Pre visit review using our clinic review tool, if applicable. No additional management support is needed unless otherwise documented below in the visit note. 

## 2014-08-11 ENCOUNTER — Other Ambulatory Visit (INDEPENDENT_AMBULATORY_CARE_PROVIDER_SITE_OTHER): Payer: BLUE CROSS/BLUE SHIELD

## 2014-08-11 ENCOUNTER — Other Ambulatory Visit: Payer: Self-pay

## 2014-08-11 DIAGNOSIS — E1159 Type 2 diabetes mellitus with other circulatory complications: Secondary | ICD-10-CM | POA: Diagnosis not present

## 2014-08-11 LAB — BASIC METABOLIC PANEL
BUN: 16 mg/dL (ref 6–23)
CO2: 27 meq/L (ref 19–32)
CREATININE: 0.99 mg/dL (ref 0.40–1.50)
Calcium: 9.3 mg/dL (ref 8.4–10.5)
Chloride: 98 mEq/L (ref 96–112)
GFR: 98.24 mL/min (ref >60.00)
Glucose, Bld: 129 mg/dL — ABNORMAL HIGH (ref 70–99)
POTASSIUM: 3.9 meq/L (ref 3.5–5.1)
Sodium: 136 mEq/L (ref 135–145)

## 2014-08-11 LAB — MICROALBUMIN / CREATININE URINE RATIO
Creatinine,U: 163.5 mg/dL
MICROALB/CREAT RATIO: 0.4 mg/g (ref 0.0–30.0)
Microalb, Ur: <0.7 mg/dL (ref 0.0–1.9)

## 2014-08-11 LAB — HEMOGLOBIN A1C: HEMOGLOBIN A1C: 7.6 % — AB (ref 4.6–6.5)

## 2014-08-15 ENCOUNTER — Encounter: Payer: Self-pay | Admitting: Endocrinology

## 2014-08-15 ENCOUNTER — Other Ambulatory Visit: Payer: Self-pay | Admitting: Endocrinology

## 2014-08-15 ENCOUNTER — Ambulatory Visit (INDEPENDENT_AMBULATORY_CARE_PROVIDER_SITE_OTHER): Payer: BLUE CROSS/BLUE SHIELD | Admitting: Endocrinology

## 2014-08-15 VITALS — BP 122/84 | HR 64 | Temp 97.9°F | Ht 66.5 in | Wt 173.0 lb

## 2014-08-15 DIAGNOSIS — E1159 Type 2 diabetes mellitus with other circulatory complications: Secondary | ICD-10-CM | POA: Diagnosis not present

## 2014-08-15 MED ORDER — CANAGLIFLOZIN 300 MG PO TABS
300.0000 mg | ORAL_TABLET | Freq: Every day | ORAL | Status: DC
Start: 2014-08-15 — End: 2015-02-27

## 2014-08-15 MED ORDER — HYDROCHLOROTHIAZIDE 12.5 MG PO TABS: 12.5000 mg | ORAL_TABLET | Freq: Every day | ORAL | Status: DC

## 2014-08-15 MED ORDER — CANAGLIFLOZIN 300 MG PO TABS: 300.0000 mg | ORAL_TABLET | Freq: Every day | ORAL | Status: DC

## 2014-08-15 NOTE — Progress Notes (Signed)
Subjective:    Patient ID: [name], male    DOB: 10-17-1950, 64 y.o.   MRN: [id number]  HPI Pt returns for f/u of diabetes mellitus: DM type: 2 Dx'ed: 0037 Complications: cerebrovascular disease Therapy: 3 oral meds DKA: never Severe hypoglycemia: never Pancreatitis: never Other: he can't take pioglitizone, due to edema Interval history: no cbg record, but states cbg's are well-controlled.  pt states he feels well in general. Past Medical History  Diagnosis Date  . History of bronchitis   . Hypertension   . Cerebrovascular disease, unspecified   . Other and unspecified hyperlipidemia     Hypercholesterolemia, Borderline  . Diabetes mellitus   . Testicular hypofunction   . Impotence of organic origin   . Knee pain     Right    Past Surgical History  Procedure Laterality Date  . Shoulder surgery Right 2004, 1990  . Wisdom tooth extraction      Social History   Social History  . Marital Status: Married    Spouse Name: [name]  . Number of Children: 0  . Years of Education: N/A   Occupational History  .     Social History Main Topics  . Smoking status: Never Smoker   . Smokeless tobacco: Never Used  . Alcohol Use: 0.0 oz/week    0 Standard drinks or equivalent per week     Comment: rare beer  . Drug Use: No  . Sexual Activity: Yes   Other Topics Concern  . Not on file   Social History Narrative   Regular exercise-yes   Caffeine-2 cups   Married=wife [name] 22 years   Cigar Smoker-quit smoking Jan 2010    Current Outpatient Prescriptions on File Prior to Visit  Medication Sig Dispense Refill  . acyclovir (ZOVIRAX) 800 MG tablet Take 800 mg by mouth daily.     [name] aspirin 81 MG tablet Take 81 mg by mouth daily.      [name] atenolol (TENORMIN) 50 MG tablet TAKE ONE TABLET BY MOUTH ONCE DAILY **REPLACES  BYSTOLIC** 90 tablet 1  . bromocriptine (PARLODEL) 2.5 MG tablet Take 1 tablet (2.5 mg total) by mouth at bedtime. 90 tablet 3  . linagliptin (TRADJENTA)  5 MG TABS tablet Take 1 tablet (5 mg total) by mouth daily. 30 tablet 11  . meloxicam (MOBIC) 7.5 MG tablet TAKE ONE TABLET BY MOUTH EVERY DAY AS NEEDED FOR  ARTHRITIS  PAIN 30 tablet 6  . metFORMIN (GLUCOPHAGE-XR) 500 MG 24 hr tablet TAKE TWO TABLETS BY MOUTH TWICE DAILY 120 tablet 0  . Multiple Vitamin (MULTIVITAMIN) tablet Take 1 tablet by mouth daily.      . rosuvastatin (CRESTOR) 5 MG tablet Take 1 tablet (5 mg total) by mouth daily. 30 tablet 3  . sildenafil (VIAGRA) 100 MG tablet Take 100 mg by mouth daily as needed. Use as directed     . Testosterone (ANDROGEL) 20.25 MG/1.25GM (1.62%) GEL Apply [2] pumps to each to shoulders every morning     No current facility-administered medications on file prior to visit.    Allergies  Allergen Reactions  . Actos [Pioglitazone]     edema  . Azithromycin     REACTION: rash  . Tetracycline     REACTION: rash  . Trandolapril     REACTION: ALLERGIC to ACEs w/ Angioedema  . Other Rash    MAGIC MOUTHWASH: CAUSES RASH    Family History  Problem Relation Age of Onset  . Heart disease Mother 54  Deceased  . Diabetes Father 62    Deceased  . Heart disease Sister     heart trouble  . Diabetes Brother   . Colon cancer Neg Hx   . Heart attack Mother   . Hypertension Mother   . Hypertension Father   . Cancer Paternal Aunt     x1-Bone  . Cancer Paternal Aunt     multiple  . Heart attack Maternal Grandmother   . Healthy Sister     x6    BP 122/84 mmHg  Pulse 64  Temp(Src) 97.9 F (36.6 C) (Oral)  Ht 5' 6.5" (1.689 m)  Wt 173 lb (78.472 kg)  BMI 27.51 kg/m2  SpO2 95%  Review of Systems He denies hypoglycemia    Objective:   Physical Exam VITAL SIGNS:  See vs page GENERAL: no distress Pulses: dorsalis pedis intact bilat.   MSK: no deformity of the feet CV: trace bilat leg edema Skin:  no ulcer on the feet.  normal color and temp on the feet. Neuro: sensation is intact to touch on the feet.    Lab Results  Component  Value Date   HGBA1C 7.6* 08/11/2014   Lab Results  Component Value Date   CREATININE 0.99 08/11/2014   BUN 16 08/11/2014   NA 136 08/11/2014   K 3.9 08/11/2014   CL 98 08/11/2014   CO2 27 08/11/2014      Assessment & Plan:  DM: Needs increased rx, if it can be done with a regimen that avoids or minimizes hypoglycemia. HTN: well-controlled Edema: change to invokana will help.  We'll recheck upon return here.  Patient is advised the following: Patient Instructions  let's add "invokana."  Here is a discount card. Because this will make you urinate more, please reduce the HCTZ to 12.5 mg daily.  i have sent a prescription to your pharmacy check your blood sugar once a day.  vary the time of day when you check, between before the 3 meals, and at bedtime.  also check if you have symptoms of your blood sugar being too high or too low.  please keep a record of the readings and bring it to your next appointment here.  please call us sooner if your blood sugar goes below 70, or if you have a lot of readings over 200.  Please come back for a follow-up appointment in 3-4 months.

## 2014-08-15 NOTE — Patient Instructions (Addendum)
let's add "invokana."  Here is a discount card. Because this will make you urinate more, please reduce the HCTZ to 12.5 mg daily.  i have sent a prescription to your pharmacy check your blood sugar once a day.  vary the time of day when you check, between before the 3 meals, and at bedtime.  also check if you have symptoms of your blood sugar being too high or too low.  please keep a record of the readings and bring it to your next appointment here.  please call us sooner if your blood sugar goes below 70, or if you have a lot of readings over 200.  Please come back for a follow-up appointment in 3-4 months.

## 2014-08-19 ENCOUNTER — Other Ambulatory Visit: Payer: Self-pay | Admitting: Physician Assistant

## 2014-08-19 MED ORDER — ROSUVASTATIN CALCIUM 5 MG PO TABS: 5.0000 mg | ORAL_TABLET | Freq: Every day | ORAL | Status: DC

## 2014-08-21 ENCOUNTER — Ambulatory Visit (INDEPENDENT_AMBULATORY_CARE_PROVIDER_SITE_OTHER): Payer: BLUE CROSS/BLUE SHIELD | Admitting: Family Medicine

## 2014-08-21 VITALS — BP 138/76 | HR 61 | Temp 97.9°F | Wt 177.2 lb

## 2014-08-21 DIAGNOSIS — R609 Edema, unspecified: Secondary | ICD-10-CM | POA: Diagnosis not present

## 2014-08-21 NOTE — Patient Instructions (Addendum)
Take your 25 mg hydrochlorothiazide for 3-4 days until swelling subsides then cut back to the 12.5 mg   Edema Edema is an abnormal buildup of fluids in your bodytissues. Edema is somewhatdependent on gravity to pull the fluid to the lowest place in your body. That makes the condition more common in the legs and thighs (lower extremities). Painless swelling of the feet and ankles is common and becomes more likely as you get older. It is also common in looser tissues, like around your eyes.  When the affected area is squeezed, the fluid may move out of that spot and leave a dent for a few moments. This dent is called pitting.  CAUSES  There are many possible causes of edema. Eating too much salt and being on your feet or sitting for a long time can cause edema in your legs and ankles. Hot weather may make edema worse. Common medical causes of edema include:  Heart failure.  Liver disease.  Kidney disease.  Weak blood vessels in your legs.  Cancer.  An injury.  Pregnancy.  Some medications.  Obesity. SYMPTOMS  Edema is usually painless.Your skin may look swollen or shiny.  DIAGNOSIS  Your health care provider may be able to diagnose edema by asking about your medical history and doing a physical exam. You may need to have tests such as X-rays, an electrocardiogram, or blood tests to check for medical conditions that may cause edema.  TREATMENT  Edema treatment depends on the cause. If you have heart, liver, or kidney disease, you need the treatment appropriate for these conditions. General treatment may include:  Elevation of the affected body part above the level of your heart.  Compression of the affected body part. Pressure from elastic bandages or support stockings squeezes the tissues and forces fluid back into the blood vessels. This keeps fluid from entering the tissues.  Restriction of fluid and salt intake.  Use of a water pill (diuretic). These medications are  appropriate only for some types of edema. They pull fluid out of your body and make you urinate more often. This gets rid of fluid and reduces swelling, but diuretics can have side effects. Only use diuretics as directed by your health care provider. HOME CARE INSTRUCTIONS   Keep the affected body part above the level of your heart when you are lying down.   Do not sit still or stand for prolonged periods.   Do not put anything directly under your knees when lying down.  Do not wear constricting clothing or garters on your upper legs.   Exercise your legs to work the fluid back into your blood vessels. This may help the swelling go down.   Wear elastic bandages or support stockings to reduce ankle swelling as directed by your health care provider.   Eat a low-salt diet to reduce fluid if your health care provider recommends it.   Only take medicines as directed by your health care provider. SEEK MEDICAL CARE IF:   Your edema is not responding to treatment.  You have heart, liver, or kidney disease and notice symptoms of edema.  You have edema in your legs that does not improve after elevating them.   You have sudden and unexplained weight gain. SEEK IMMEDIATE MEDICAL CARE IF:   You develop shortness of breath or chest pain.   You cannot breathe when you lie down.  You develop pain, redness, or warmth in the swollen areas.   You have heart, liver, or  kidney disease and suddenly get edema.  You have a fever and your symptoms suddenly get worse. MAKE SURE YOU:   Understand these instructions.  Will watch your condition.  Will get help right away if you are not doing well or get worse. Document Released: 12/20/2004 Document Revised: 05/06/2013 Document Reviewed: 10/12/2012 [facility] Patient Information 2015 [place], [place]. This information is not intended to replace advice given to you by your health care provider. Make sure you discuss any questions you have with  your health care provider.

## 2014-08-21 NOTE — Progress Notes (Signed)
Pre visit review using our clinic review tool, if applicable. No additional management support is needed unless otherwise documented below in the visit note. 

## 2014-08-21 NOTE — Progress Notes (Signed)
Patient ID: [name], male   DOB: Feb 16, 1950, 64 y.o.   MRN: [id number]   Subjective:    Patient ID: [name], male    DOB: [birth year], 64 y.o.   MRN: [id number]  Chief Complaint  Patient presents with  . Edema    and throbbing in both ankles since last night    HPI Patient is in today for c/o edema in low ext x 2 nights.  Endo dec hct and added invokanna but he says he did realize there was a new med to start until he saw his instructions so he did not start it until yesterday.  Swelling is better today.  No sob, no cp, no calf pain.   Past Medical History  Diagnosis Date  . History of bronchitis   . Hypertension   . Cerebrovascular disease, unspecified   . Other and unspecified hyperlipidemia     Hypercholesterolemia, Borderline  . Diabetes mellitus   . Testicular hypofunction   . Impotence of organic origin   . Knee pain     Right    Past Surgical History  Procedure Laterality Date  . Shoulder surgery Right 2004, 1990  . Wisdom tooth extraction      Family History  Problem Relation Age of Onset  . Heart disease Mother 52    Deceased  . Diabetes Father 60    Deceased  . Heart disease Sister     heart trouble  . Diabetes Brother   . Colon cancer Neg Hx   . Heart attack Mother   . Hypertension Mother   . Hypertension Father   . Cancer Paternal Aunt     x1-Bone  . Cancer Paternal Aunt     multiple  . Heart attack Maternal Grandmother   . Healthy Sister     x6    Social History   Social History  . Marital Status: Married    Spouse Name: [name]  . Number of Children: 0  . Years of Education: N/A   Occupational History  .     Social History Main Topics  . Smoking status: Never Smoker   . Smokeless tobacco: Never Used  . Alcohol Use: 0.0 oz/week    0 Standard drinks or equivalent per week     Comment: rare beer  . Drug Use: No  . Sexual Activity: Yes   Other Topics Concern  . Not on file   Social History Narrative   Regular  exercise-yes   Caffeine-2 cups   Married=wife [name] 22 years   Cigar Smoker-quit smoking Jan 2010    Outpatient Prescriptions Prior to Visit  Medication Sig Dispense Refill  . acyclovir (ZOVIRAX) 800 MG tablet Take 800 mg by mouth daily.     [name] aspirin 81 MG tablet Take 81 mg by mouth daily.      [name] atenolol (TENORMIN) 50 MG tablet TAKE ONE TABLET BY MOUTH ONCE DAILY **REPLACES  BYSTOLIC** 90 tablet 1  . bromocriptine (PARLODEL) 2.5 MG tablet Take 1 tablet (2.5 mg total) by mouth at bedtime. 90 tablet 3  . canagliflozin (INVOKANA) 300 MG TABS tablet Take 300 mg by mouth daily. 90 tablet 3  . hydrochlorothiazide (HYDRODIURIL) 12.5 MG tablet Take 1 tablet (12.5 mg total) by mouth daily. 90 tablet 3  . JANUVIA 100 MG tablet TAKE 1 TABLET DAILY 90 tablet 0  . linagliptin (TRADJENTA) 5 MG TABS tablet Take 1 tablet (5 mg total) by mouth daily. 30 tablet 11  . metFORMIN (GLUCOPHAGE-XR) 500  MG 24 hr tablet TAKE TWO TABLETS BY MOUTH TWICE DAILY 120 tablet 0  . Multiple Vitamin (MULTIVITAMIN) tablet Take 1 tablet by mouth daily.      . rosuvastatin (CRESTOR) 5 MG tablet Take 1 tablet (5 mg total) by mouth daily. 90 tablet 1  . sildenafil (VIAGRA) 100 MG tablet Take 100 mg by mouth daily as needed. Use as directed     . Testosterone (ANDROGEL) 20.25 MG/1.25GM (1.62%) GEL Apply [2] pumps to each to shoulders every morning    . meloxicam (MOBIC) 7.5 MG tablet TAKE ONE TABLET BY MOUTH EVERY DAY AS NEEDED FOR  ARTHRITIS  PAIN (Patient not taking: Reported on 08/21/2014) 30 tablet 6   No facility-administered medications prior to visit.    Allergies  Allergen Reactions  . Actos [Pioglitazone]     edema  . Azithromycin     REACTION: rash  . Tetracycline     REACTION: rash  . Trandolapril     REACTION: ALLERGIC to ACEs w/ Angioedema  . Other Rash    MAGIC MOUTHWASH: CAUSES RASH    Review of Systems  Constitutional: Negative for fever and malaise/fatigue.  HENT: Negative for congestion.   Eyes:  Negative for discharge.  Respiratory: Negative for shortness of breath.   Cardiovascular: Positive for leg swelling. Negative for chest pain and palpitations.  Gastrointestinal: Negative for nausea and abdominal pain.  Genitourinary: Negative for dysuria.  Musculoskeletal: Negative for falls.  Skin: Negative for rash.  Neurological: Negative for loss of consciousness and headaches.  Endo/Heme/Allergies: Negative for environmental allergies.  Psychiatric/Behavioral: Negative for depression. The patient is not nervous/anxious.        Objective:    Physical Exam  Constitutional: He is oriented to person, place, and time. Vital signs are normal. He appears well-developed and well-nourished. He is sleeping.  HENT:  Head: Normocephalic and atraumatic.  Mouth/Throat: Oropharynx is clear and moist.  Eyes: EOM are normal. Pupils are equal, round, and reactive to light.  Neck: Normal range of motion. Neck supple. No thyromegaly present.  Cardiovascular: Normal rate and regular rhythm.   No murmur heard. Pulmonary/Chest: Effort normal and breath sounds normal. No respiratory distress. He has no wheezes. He has no rales. He exhibits no tenderness.  Musculoskeletal: He exhibits edema. He exhibits no tenderness.       Right ankle: He exhibits swelling.       Left ankle: He exhibits swelling.       Right lower leg: He exhibits edema. He exhibits no tenderness.       Left lower leg: He exhibits edema. He exhibits no tenderness.  Neurological: He is alert and oriented to person, place, and time.  Skin: Skin is warm and dry.  Psychiatric: He has a normal mood and affect. His behavior is normal. Judgment and thought content normal.    BP 138/76 mmHg  Pulse 61  Temp(Src) 97.9 F (36.6 C) (Oral)  Wt 177 lb 3.2 oz (80.377 kg)  SpO2 98% Wt Readings from Last 3 Encounters:  08/21/14 177 lb 3.2 oz (80.377 kg)  08/15/14 173 lb (78.472 kg)  07/14/14 175 lb (79.379 kg)     Lab Results  Component  Value Date   WBC 5.8 03/17/2014   HGB 13.2 03/17/2014   HCT 40.1 03/17/2014   PLT 238.0 03/17/2014   GLUCOSE 129* 08/11/2014   CHOL 200 03/17/2014   TRIG 66.0 03/17/2014   HDL 55.10 03/17/2014   LDLDIRECT 146.5 10/29/2012   LDLCALC 132* 03/17/2014  ALT 33 03/17/2014   AST 26 03/17/2014   NA 136 08/11/2014   K 3.9 08/11/2014   CL 98 08/11/2014   CREATININE 0.99 08/11/2014   BUN 16 08/11/2014   CO2 27 08/11/2014   TSH 1.16 03/17/2014   PSA 1.23 03/17/2014   HGBA1C 7.6* 08/11/2014   MICROALBUR <0.7 08/11/2014    Lab Results  Component Value Date   TSH 1.16 03/17/2014   Lab Results  Component Value Date   WBC 5.8 03/17/2014   HGB 13.2 03/17/2014   HCT 40.1 03/17/2014   MCV 79.3 03/17/2014   PLT 238.0 03/17/2014   Lab Results  Component Value Date   NA 136 08/11/2014   K 3.9 08/11/2014   CO2 27 08/11/2014   GLUCOSE 129* 08/11/2014   BUN 16 08/11/2014   CREATININE 0.99 08/11/2014   BILITOT 0.4 03/17/2014   ALKPHOS 56 03/17/2014   AST 26 03/17/2014   ALT 33 03/17/2014   PROT 6.5 03/17/2014   ALBUMIN 4.1 03/17/2014   CALCIUM 9.3 08/11/2014   GFR 98.24 08/11/2014   Lab Results  Component Value Date   CHOL 200 03/17/2014   Lab Results  Component Value Date   HDL 55.10 03/17/2014   Lab Results  Component Value Date   LDLCALC 132* 03/17/2014   Lab Results  Component Value Date   TRIG 66.0 03/17/2014   Lab Results  Component Value Date   CHOLHDL 4 03/17/2014   Lab Results  Component Value Date   HGBA1C 7.6* 08/11/2014       Assessment & Plan:   Problem List Items Addressed This Visit    Edema - Primary    Endo decreased hctz and started invokanna hoping to dec swelling Pt did not start invokanna until yesterday Inc hctz to 25mg  for 2-3 days then dec to 12.5 F/u prn Elevate legs  Dec salt Inc po fluid         I am having Mr. Quentin Cornwall maintain his aspirin, multivitamin, sildenafil, Testosterone, meloxicam, acyclovir, bromocriptine,  metFORMIN, linagliptin, atenolol, canagliflozin, hydrochlorothiazide, JANUVIA, and rosuvastatin.  No orders of the defined types were placed in this encounter.     Garnet Koyanagi, DO

## 2014-08-22 ENCOUNTER — Telehealth: Payer: Self-pay

## 2014-08-22 ENCOUNTER — Encounter: Payer: Self-pay | Admitting: Family Medicine

## 2014-08-22 DIAGNOSIS — R609 Edema, unspecified: Secondary | ICD-10-CM | POA: Insufficient documentation

## 2014-08-22 NOTE — Assessment & Plan Note (Addendum)
Endo decreased hctz and started invokanna hoping to dec swelling Pt did not start invokanna until yesterday Inc hctz to 25mg  for 2-3 days then dec to 12.5 F/u prn Elevate legs  Dec salt Inc po fluid

## 2014-08-22 NOTE — Telephone Encounter (Signed)
Discussed with [name] and he stated that if we are going to discontinue anything it would be the Januvia. He stated that the patient would need to continue to watch his blood sugars in the meantime and follow up prn. Message left to call the office.     KP

## 2014-08-22 NOTE — Telephone Encounter (Signed)
-----   Message from [name], DO sent at 08/21/2014  3:05 PM EDT ----- Pt is on [place] and tradjenta?  He should not be on both.

## 2014-08-25 ENCOUNTER — Telehealth: Payer: Self-pay | Admitting: Endocrinology

## 2014-08-25 MED ORDER — METFORMIN HCL ER 500 MG PO TB24: 1000.0000 mg | ORAL_TABLET | Freq: Two times a day (BID) | ORAL | Status: DC

## 2014-08-25 NOTE — Telephone Encounter (Signed)
Patient called stating that his Rx was never sent to his pharmacy  Could we please call in a Rx asap as he is currently out of medication   Rx: Metformin   Pharmacy: [facility] on [address]   Also, can we send a script to his mail order [facility]   Thank you

## 2014-08-25 NOTE — Telephone Encounter (Signed)
Rx sent to [facility] [address].

## 2014-08-29 NOTE — Telephone Encounter (Signed)
Spoke with patient and he has been made aware to stop the Januvia per [place] and continue to watch hi sugars and if any problems or concerns call us or follow up with Endo.     KP

## 2014-09-12 ENCOUNTER — Other Ambulatory Visit: Payer: Self-pay | Admitting: Endocrinology

## 2014-10-09 ENCOUNTER — Other Ambulatory Visit: Payer: Self-pay | Admitting: Physician Assistant

## 2014-10-09 NOTE — Telephone Encounter (Signed)
Last filled: 03/17/14  Amt: 90, 1 Last OV:  07/14/14  New rx sent to pharmacy.

## 2014-10-16 LAB — HM DIABETES EYE EXAM

## 2014-11-13 ENCOUNTER — Ambulatory Visit (INDEPENDENT_AMBULATORY_CARE_PROVIDER_SITE_OTHER): Payer: BLUE CROSS/BLUE SHIELD | Admitting: Endocrinology

## 2014-11-13 ENCOUNTER — Encounter: Payer: Self-pay | Admitting: Endocrinology

## 2014-11-13 VITALS — BP 128/85 | HR 62 | Temp 97.6°F | Ht 66.5 in | Wt 168.0 lb

## 2014-11-13 DIAGNOSIS — E1159 Type 2 diabetes mellitus with other circulatory complications: Secondary | ICD-10-CM | POA: Diagnosis not present

## 2014-11-13 LAB — POCT GLYCOSYLATED HEMOGLOBIN (HGB A1C): HEMOGLOBIN A1C: 7.6

## 2014-11-13 MED ORDER — REPAGLINIDE 0.5 MG PO TABS: 0.5000 mg | ORAL_TABLET | Freq: Three times a day (TID) | ORAL | Status: DC

## 2014-11-13 NOTE — Progress Notes (Signed)
Subjective:    Patient ID: [name], male    DOB: 15-Jul-1950, 64 y.o.   MRN: [id number]  HPI Pt returns for f/u of diabetes mellitus: DM type: 2 Dx'ed: 99991111 Complications: cerebrovascular disease Therapy: 4 oral meds DKA: never Severe hypoglycemia: never Pancreatitis: never. Other: he can't take pioglitizone, due to edema; due to lean body habitus, he is felt to be possibly developing type 1 DM. Interval history: no cbg record, but states cbg's are well-controlled.  pt states he feels well in general. Past Medical History  Diagnosis Date  . History of bronchitis   . Hypertension   . Cerebrovascular disease, unspecified   . Other and unspecified hyperlipidemia     Hypercholesterolemia, Borderline  . Diabetes mellitus   . Testicular hypofunction   . Impotence of organic origin   . Knee pain     Right    Past Surgical History  Procedure Laterality Date  . Shoulder surgery Right 2004, 1990  . Wisdom tooth extraction      Social History   Social History  . Marital Status: Married    Spouse Name: [name]  . Number of Children: 0  . Years of Education: N/A   Occupational History  .     Social History Main Topics  . Smoking status: Never Smoker   . Smokeless tobacco: Never Used  . Alcohol Use: 0.0 oz/week    0 Standard drinks or equivalent per week     Comment: rare beer  . Drug Use: No  . Sexual Activity: Yes   Other Topics Concern  . Not on file   Social History Narrative   Regular exercise-yes   Caffeine-2 cups   Married=wife [name] 22 years   Cigar Smoker-quit smoking Jan 2010    Current Outpatient Prescriptions on File Prior to Visit  Medication Sig Dispense Refill  . acyclovir (ZOVIRAX) 800 MG tablet Take 800 mg by mouth daily.     [name] aspirin 81 MG tablet Take 81 mg by mouth daily.      [name] atenolol (TENORMIN) 50 MG tablet TAKE 1 TABLET DAILY (REPLACES BYSTOLIC) 90 tablet 0  . bromocriptine (PARLODEL) 2.5 MG tablet TAKE 1 TABLET AT BEDTIME 90  tablet 2  . canagliflozin (INVOKANA) 300 MG TABS tablet Take 300 mg by mouth daily. 90 tablet 3  . hydrochlorothiazide (HYDRODIURIL) 12.5 MG tablet Take 1 tablet (12.5 mg total) by mouth daily. 90 tablet 3  . linagliptin (TRADJENTA) 5 MG TABS tablet Take 1 tablet (5 mg total) by mouth daily. 30 tablet 11  . meloxicam (MOBIC) 7.5 MG tablet TAKE ONE TABLET BY MOUTH EVERY DAY AS NEEDED FOR  ARTHRITIS  PAIN 30 tablet 6  . metFORMIN (GLUCOPHAGE-XR) 500 MG 24 hr tablet Take 2 tablets (1,000 mg total) by mouth 2 (two) times daily. 360 tablet 0  . Multiple Vitamin (MULTIVITAMIN) tablet Take 1 tablet by mouth daily.      . rosuvastatin (CRESTOR) 5 MG tablet Take 1 tablet (5 mg total) by mouth daily. 90 tablet 1  . sildenafil (VIAGRA) 100 MG tablet Take 100 mg by mouth daily as needed. Use as directed     . Testosterone (ANDROGEL) 20.25 MG/1.25GM (1.62%) GEL Apply [2] pumps to each to shoulders every morning     No current facility-administered medications on file prior to visit.    Allergies  Allergen Reactions  . Actos [Pioglitazone]     edema  . Azithromycin     REACTION: rash  . Tetracycline  REACTION: rash  . Trandolapril     REACTION: ALLERGIC to ACEs w/ Angioedema  . Other Rash    MAGIC MOUTHWASH: CAUSES RASH    Family History  Problem Relation Age of Onset  . Heart disease Mother 80    Deceased  . Diabetes Father 61    Deceased  . Heart disease Sister     heart trouble  . Diabetes Brother   . Colon cancer Neg Hx   . Heart attack Mother   . Hypertension Mother   . Hypertension Father   . Cancer Paternal Aunt     x1-Bone  . Cancer Paternal Aunt     multiple  . Heart attack Maternal Grandmother   . Healthy Sister     x6    BP 128/85 mmHg  Pulse 62  Temp(Src) 97.6 F (36.4 C) (Oral)  Ht 5' 6.5" (1.689 m)  Wt 168 lb (76.204 kg)  BMI 26.71 kg/m2  SpO2 98%  Review of Systems He denies hypoglycemia.  He has lost a few lbs.    Objective:   Physical Exam VITAL  SIGNS:  See vs page GENERAL: no distress Pulses: dorsalis pedis intact bilat.   MSK: no deformity of the feet CV: no leg edema Skin:  no ulcer on the feet.  normal color and temp on the feet. Neuro: sensation is intact to touch on the feet    A1c=7.6%    Assessment & Plan:  DM: he needs increased rx  Patient is advised the following: Patient Instructions  check your blood sugar once a day.  vary the time of day when you check, between before the 3 meals, and at bedtime.  also check if you have symptoms of your blood sugar being too high or too low.  please keep a record of the readings and bring it to your next appointment here.  please call us sooner if your blood sugar goes below 70, or if you have a lot of readings over 200.  i have sent a prescription to your pharmacy, to add "repaglinide."  Please come back for a follow-up appointment in 3-4 months.  With time, you will need insulin.  Therefore, when you come back next time, please see Vaughan Basta to learn how.

## 2014-11-13 NOTE — Patient Instructions (Addendum)
check your blood sugar once a day.  vary the time of day when you check, between before the 3 meals, and at bedtime.  also check if you have symptoms of your blood sugar being too high or too low.  please keep a record of the readings and bring it to your next appointment here.  please call us sooner if your blood sugar goes below 70, or if you have a lot of readings over 200.  i have sent a prescription to your pharmacy, to add "repaglinide."  Please come back for a follow-up appointment in 3-4 months.  With time, you will need insulin.  Therefore, when you come back next time, please see Vaughan Basta to learn how.

## 2014-12-25 ENCOUNTER — Telehealth: Payer: Self-pay | Admitting: Endocrinology

## 2014-12-25 MED ORDER — BROMOCRIPTINE MESYLATE 2.5 MG PO TABS: 2.5000 mg | ORAL_TABLET | Freq: Every day | ORAL | Status: DC

## 2014-12-25 NOTE — Telephone Encounter (Signed)
Rx submitted per pt's request.  

## 2014-12-25 NOTE — Telephone Encounter (Signed)
Patient called stating that he would like a refill sent to his pharmacy  Rx: bromocriptine  Metformin   Pharmacy: [facility] on [address]    Thank you

## 2014-12-26 ENCOUNTER — Other Ambulatory Visit: Payer: Self-pay | Admitting: Endocrinology

## 2014-12-26 MED ORDER — METFORMIN HCL ER 500 MG PO TB24: 1000.0000 mg | ORAL_TABLET | Freq: Two times a day (BID) | ORAL | Status: DC

## 2014-12-26 NOTE — Telephone Encounter (Signed)
Patient need refill of metformin , send to  [facility] [place], [place]. [phone] (Phone) [phone] (Fax)

## 2015-01-06 ENCOUNTER — Encounter: Payer: Self-pay | Admitting: Physician Assistant

## 2015-01-06 ENCOUNTER — Ambulatory Visit (INDEPENDENT_AMBULATORY_CARE_PROVIDER_SITE_OTHER): Payer: BLUE CROSS/BLUE SHIELD | Admitting: Physician Assistant

## 2015-01-06 VITALS — BP 146/70 | HR 64 | Temp 97.4°F | Ht 67.0 in | Wt 171.4 lb

## 2015-01-06 DIAGNOSIS — N410 Acute prostatitis: Secondary | ICD-10-CM | POA: Diagnosis not present

## 2015-01-06 LAB — POCT URINALYSIS DIPSTICK
Bilirubin, UA: NEGATIVE
Ketones, UA: NEGATIVE
LEUKOCYTES UA: NEGATIVE
NITRITE UA: NEGATIVE
Protein, UA: NEGATIVE
RBC UA: NEGATIVE
Spec Grav, UA: 1.025
UROBILINOGEN UA: 0.2
pH, UA: 5.5

## 2015-01-06 MED ORDER — TAMSULOSIN HCL 0.4 MG PO CAPS: 0.4000 mg | ORAL_CAPSULE | Freq: Every day | ORAL | Status: DC

## 2015-01-06 MED ORDER — CIPROFLOXACIN HCL 500 MG PO TABS: 500.0000 mg | ORAL_TABLET | Freq: Two times a day (BID) | ORAL | Status: DC

## 2015-01-06 NOTE — Assessment & Plan Note (Signed)
Some mild residual dysuria without rectal pain, urgency or hematuria. Some nocturia noted which can be combination of infection and Invokana use but may be related to mild enlargement of the prostate. Urine dip with glucose 2+ (Inovokana) but no other findings. Will send for culture. Will Rx Cipro 500 mg BID x 3 days while culture pending. Rx Flomax 0.4 mg to help with nocturia and stream. Will follow-up in 1 month and check prostate at that time. Deferred today due to risk of seeding infection from prostate.

## 2015-01-06 NOTE — Progress Notes (Signed)
Patient presents to clinic today for follow-up of acute prostatitis. Has completed course of Ciprofloxacin (14-day course). Denies fever, chills. Endorses some continued urinary frequency and mild dysuria but is much better than before. Denies rectal pain. Patient does endorse nocturia x 3-4 at baseline. Denies hematuria, urinary hesitancy.  Past Medical History  Diagnosis Date  . History of bronchitis   . Hypertension   . Cerebrovascular disease, unspecified   . Other and unspecified hyperlipidemia     Hypercholesterolemia, Borderline  . Diabetes mellitus   . Testicular hypofunction   . Impotence of organic origin   . Knee pain     Right    Current Outpatient Prescriptions on File Prior to Visit  Medication Sig Dispense Refill  . acyclovir (ZOVIRAX) 800 MG tablet Take 800 mg by mouth daily.     [name] aspirin 81 MG tablet Take 81 mg by mouth daily.      [name] atenolol (TENORMIN) 50 MG tablet TAKE 1 TABLET DAILY (REPLACES BYSTOLIC) 90 tablet 0  . bromocriptine (PARLODEL) 2.5 MG tablet Take 1 tablet (2.5 mg total) by mouth at bedtime. 90 tablet 2  . canagliflozin (INVOKANA) 300 MG TABS tablet Take 300 mg by mouth daily. 90 tablet 3  . hydrochlorothiazide (HYDRODIURIL) 12.5 MG tablet Take 1 tablet (12.5 mg total) by mouth daily. 90 tablet 3  . meloxicam (MOBIC) 7.5 MG tablet TAKE ONE TABLET BY MOUTH EVERY DAY AS NEEDED FOR  ARTHRITIS  PAIN 30 tablet 6  . metFORMIN (GLUCOPHAGE-XR) 500 MG 24 hr tablet Take 2 tablets (1,000 mg total) by mouth 2 (two) times daily. 360 tablet 0  . Multiple Vitamin (MULTIVITAMIN) tablet Take 1 tablet by mouth daily.      . repaglinide (PRANDIN) 0.5 MG tablet Take 1 tablet (0.5 mg total) by mouth 3 (three) times daily before meals. 270 tablet 3  . rosuvastatin (CRESTOR) 5 MG tablet Take 1 tablet (5 mg total) by mouth daily. 90 tablet 1  . sildenafil (VIAGRA) 100 MG tablet Take 100 mg by mouth daily as needed. Use as directed     . Testosterone (ANDROGEL) 20.25  MG/1.25GM (1.62%) GEL Apply [2] pumps to each to shoulders every morning    . linagliptin (TRADJENTA) 5 MG TABS tablet Take 1 tablet (5 mg total) by mouth daily. 30 tablet 11   No current facility-administered medications on file prior to visit.    Allergies  Allergen Reactions  . Actos [Pioglitazone]     edema  . Azithromycin     REACTION: rash  . Tetracycline     REACTION: rash  . Trandolapril     REACTION: ALLERGIC to ACEs w/ Angioedema  . Other Rash    MAGIC MOUTHWASH: CAUSES RASH    Family History  Problem Relation Age of Onset  . Heart disease Mother 40    Deceased  . Diabetes Father 46    Deceased  . Heart disease Sister     heart trouble  . Diabetes Brother   . Colon cancer Neg Hx   . Heart attack Mother   . Hypertension Mother   . Hypertension Father   . Cancer Paternal Aunt     x1-Bone  . Cancer Paternal Aunt     multiple  . Heart attack Maternal Grandmother   . Healthy Sister     x6    Social History   Social History  . Marital Status: Married    Spouse Name: [name]  . Number of Children: 0  .  Years of Education: N/A   Occupational History  .     Social History Main Topics  . Smoking status: Never Smoker   . Smokeless tobacco: Never Used  . Alcohol Use: 0.0 oz/week    0 Standard drinks or equivalent per week     Comment: rare beer  . Drug Use: No  . Sexual Activity: Yes   Other Topics Concern  . None   Social History Narrative   Regular exercise-yes   Caffeine-2 cups   Married=wife [name] 22 years   Cigar Smoker-quit smoking Jan 2010    Review of Systems - See HPI.  All other ROS are negative.  BP 146/70 mmHg  Pulse 64  Temp(Src) 97.4 F (36.3 C) (Oral)  Ht 5\' 7"  (1.702 m)  Wt 171 lb 6.4 oz (77.747 kg)  BMI 26.84 kg/m2  SpO2 98%  Physical Exam  Constitutional: He is oriented to person, place, and time and well-developed, well-nourished, and in no distress.  HENT:  Head: Normocephalic and atraumatic.  Cardiovascular:  Normal rate, regular rhythm, normal heart sounds and intact distal pulses.   Pulmonary/Chest: Effort normal and breath sounds normal. No respiratory distress. He has no wheezes. He has no rales. He exhibits no tenderness.  Abdominal: Soft. Bowel sounds are normal. There is no tenderness.  Neurological: He is alert and oriented to person, place, and time.  Skin: Skin is warm and dry. No rash noted.  Vitals reviewed.   Recent Results (from the past 2160 hour(s))  POCT glycosylated hemoglobin (Hb A1C)     Status: None   Collection Time: 11/13/14  8:39 AM  Result Value Ref Range   Hemoglobin A1C 7.6   POCT urinalysis dipstick     Status: None   Collection Time: 01/06/15  8:14 AM  Result Value Ref Range   Color, UA yellow    Clarity, UA clear    Glucose, UA 3+    Bilirubin, UA neg    Ketones, UA neg    Spec Grav, UA 1.025    Blood, UA neg    pH, UA 5.5    Protein, UA neg    Urobilinogen, UA 0.2    Nitrite, UA neg    Leukocytes, UA Negative Negative    Assessment/Plan: Acute prostatitis Some mild residual dysuria without rectal pain, urgency or hematuria. Some nocturia noted which can be combination of infection and Invokana use but may be related to mild enlargement of the prostate. Urine dip with glucose 2+ (Inovokana) but no other findings. Will send for culture. Will Rx Cipro 500 mg BID x 3 days while culture pending. Rx Flomax 0.4 mg to help with nocturia and stream. Will follow-up in 1 month and check prostate at that time. Deferred today due to risk of seeding infection from prostate.

## 2015-01-06 NOTE — Progress Notes (Signed)
Pre visit review using our clinic review tool, if applicable. No additional management support is needed unless otherwise documented below in the visit note. 

## 2015-01-06 NOTE — Patient Instructions (Signed)
Please take antibiotic as directed to complete course. I will call with your urine culture results. I do feel that an enlarged prostate is contributing -- take the Flomax after dinner as directed to help with nighttime symptoms.  Follow-up in 1 month.

## 2015-01-07 LAB — CULTURE, URINE COMPREHENSIVE
Colony Count: NO GROWTH
ORGANISM ID, BACTERIA: NO GROWTH

## 2015-01-15 ENCOUNTER — Telehealth: Payer: Self-pay | Admitting: Physician Assistant

## 2015-01-15 NOTE — Telephone Encounter (Signed)
Relation to [id number] Call back [phone] Pharmacy: [facility] [place], [place]. [phone] (Phone) [phone] (Fax)         Reason for call:  Patient requesting a refill atenolol (TENORMIN) 50 MG tablet

## 2015-01-16 MED ORDER — ATENOLOL 50 MG PO TABS: ORAL_TABLET | ORAL | Status: DC

## 2015-01-16 NOTE — Telephone Encounter (Signed)
Refill sent.

## 2015-01-26 ENCOUNTER — Other Ambulatory Visit: Payer: Self-pay | Admitting: Physician Assistant

## 2015-02-09 ENCOUNTER — Encounter: Payer: BLUE CROSS/BLUE SHIELD | Attending: Endocrinology | Admitting: Nutrition

## 2015-02-09 DIAGNOSIS — E1159 Type 2 diabetes mellitus with other circulatory complications: Secondary | ICD-10-CM | POA: Diagnosis not present

## 2015-02-09 DIAGNOSIS — I679 Cerebrovascular disease, unspecified: Secondary | ICD-10-CM | POA: Insufficient documentation

## 2015-02-09 DIAGNOSIS — M25561 Pain in right knee: Secondary | ICD-10-CM | POA: Diagnosis not present

## 2015-02-09 DIAGNOSIS — Z7984 Long term (current) use of oral hypoglycemic drugs: Secondary | ICD-10-CM | POA: Diagnosis not present

## 2015-02-09 DIAGNOSIS — E785 Hyperlipidemia, unspecified: Secondary | ICD-10-CM | POA: Insufficient documentation

## 2015-02-09 DIAGNOSIS — Z7982 Long term (current) use of aspirin: Secondary | ICD-10-CM | POA: Insufficient documentation

## 2015-02-09 DIAGNOSIS — I10 Essential (primary) hypertension: Secondary | ICD-10-CM | POA: Insufficient documentation

## 2015-02-09 DIAGNOSIS — E291 Testicular hypofunction: Secondary | ICD-10-CM | POA: Insufficient documentation

## 2015-02-09 NOTE — Progress Notes (Signed)
[name] was shown how to use an insulin pen, how to store the insulin, and where to inject.  He reported good understanding of this.  We also discussed low blood sugars--symptoms and treatments.  He reports feeling low some times when he is very active and drinks juice when this happens.  His father was on insulin and he used to give him injections with a syringe. We also discussed ways he can help his insulin to work better--by exercise and balancing meals.  We discussed the idea of watching portion sizes of carbohydrates, proteins and fats at each meal, and he reported good understanding of this.   We also discussed the importance of testing blood sugars to see if he will be on the correct insulin dose.  He reported good understanding of this. He had no final questions.

## 2015-02-13 ENCOUNTER — Ambulatory Visit (INDEPENDENT_AMBULATORY_CARE_PROVIDER_SITE_OTHER): Payer: BLUE CROSS/BLUE SHIELD | Admitting: Endocrinology

## 2015-02-13 ENCOUNTER — Encounter: Payer: Self-pay | Admitting: Endocrinology

## 2015-02-13 VITALS — BP 122/82 | HR 82 | Temp 98.6°F | Ht 67.0 in | Wt 168.0 lb

## 2015-02-13 DIAGNOSIS — E1159 Type 2 diabetes mellitus with other circulatory complications: Secondary | ICD-10-CM | POA: Diagnosis not present

## 2015-02-13 LAB — POCT GLYCOSYLATED HEMOGLOBIN (HGB A1C): HEMOGLOBIN A1C: 6

## 2015-02-13 MED ORDER — REPAGLINIDE 0.5 MG PO TABS: 0.5000 mg | ORAL_TABLET | Freq: Two times a day (BID) | ORAL | Status: DC

## 2015-02-13 NOTE — Progress Notes (Signed)
Subjective:    Patient ID: [name], male    DOB: 02-24-1950, 65 y.o.   MRN: [id number]  HPI Pt returns for f/u of diabetes mellitus: DM type: 2 (but due to lean body habitus, he is felt to be possibly developing type 1) Dx'ed: 99991111 Complications: cerebrovascular disease Therapy: 5 oral meds DKA: never Severe hypoglycemia: never Pancreatitis: never. Other: he can't take pioglitizone, due to edema; he has never taken insulin, but he has learned how.  Interval history: no cbg record, but states cbg's are well-controlled.  pt states he feels well in general. He has intermittent mild hypoglycemia at lunch.  It is highest later in the day.   Past Medical History  Diagnosis Date  . History of bronchitis   . Hypertension   . Cerebrovascular disease, unspecified   . Other and unspecified hyperlipidemia     Hypercholesterolemia, Borderline  . Diabetes mellitus   . Testicular hypofunction   . Impotence of organic origin   . Knee pain     Right    Past Surgical History  Procedure Laterality Date  . Shoulder surgery Right 2004, 1990  . Wisdom tooth extraction      Social History   Social History  . Marital Status: Married    Spouse Name: [name]  . Number of Children: 0  . Years of Education: N/A   Occupational History  .     Social History Main Topics  . Smoking status: Never Smoker   . Smokeless tobacco: Never Used  . Alcohol Use: 0.0 oz/week    0 Standard drinks or equivalent per week     Comment: rare beer  . Drug Use: No  . Sexual Activity: Yes   Other Topics Concern  . Not on file   Social History Narrative   Regular exercise-yes   Caffeine-2 cups   Married=wife [name] 22 years   Cigar Smoker-quit smoking Jan 2010    Current Outpatient Prescriptions on File Prior to Visit  Medication Sig Dispense Refill  . acyclovir (ZOVIRAX) 800 MG tablet Take 800 mg by mouth daily.     [name] aspirin 81 MG tablet Take 81 mg by mouth daily.      [name] atenolol (TENORMIN) 50  MG tablet TAKE 1 TABLET DAILY (REPLACES BYSTOLIC) 90 tablet 1  . bromocriptine (PARLODEL) 2.5 MG tablet Take 1 tablet (2.5 mg total) by mouth at bedtime. 90 tablet 2  . canagliflozin (INVOKANA) 300 MG TABS tablet Take 300 mg by mouth daily. 90 tablet 3  . hydrochlorothiazide (HYDRODIURIL) 12.5 MG tablet Take 1 tablet (12.5 mg total) by mouth daily. 90 tablet 3  . linagliptin (TRADJENTA) 5 MG TABS tablet Take 1 tablet (5 mg total) by mouth daily. 30 tablet 11  . meloxicam (MOBIC) 7.5 MG tablet TAKE ONE TABLET BY MOUTH EVERY DAY AS NEEDED FOR  ARTHRITIS  PAIN 30 tablet 6  . metFORMIN (GLUCOPHAGE-XR) 500 MG 24 hr tablet Take 2 tablets (1,000 mg total) by mouth 2 (two) times daily. 360 tablet 0  . Multiple Vitamin (MULTIVITAMIN) tablet Take 1 tablet by mouth daily.      . rosuvastatin (CRESTOR) 5 MG tablet Take 1 tablet (5 mg total) by mouth daily. 90 tablet 1  . sildenafil (VIAGRA) 100 MG tablet Take 100 mg by mouth daily as needed. Use as directed     . tamsulosin (FLOMAX) 0.4 MG CAPS capsule Take 1 capsule (0.4 mg total) by mouth daily. 30 capsule 3  . Testosterone (ANDROGEL) 20.25  MG/1.25GM (1.62%) GEL Apply [2] pumps to each to shoulders every morning     No current facility-administered medications on file prior to visit.    Allergies  Allergen Reactions  . Actos [Pioglitazone]     edema  . Azithromycin     REACTION: rash  . Tetracycline     REACTION: rash  . Trandolapril     REACTION: ALLERGIC to ACEs w/ Angioedema  . Other Rash    MAGIC MOUTHWASH: CAUSES RASH    Family History  Problem Relation Age of Onset  . Heart disease Mother 24    Deceased  . Diabetes Father 61    Deceased  . Heart disease Sister     heart trouble  . Diabetes Brother   . Colon cancer Neg Hx   . Heart attack Mother   . Hypertension Mother   . Hypertension Father   . Cancer Paternal Aunt     x1-Bone  . Cancer Paternal Aunt     multiple  . Heart attack Maternal Grandmother   . Healthy Sister      x6    BP 122/82 mmHg  Pulse 82  Temp(Src) 98.6 F (37 C) (Oral)  Ht 5\' 7"  (1.702 m)  Wt 168 lb (76.204 kg)  BMI 26.31 kg/m2  SpO2 97%   Review of Systems Denies LOC    Objective:   Physical Exam VITAL SIGNS:  See vs page GENERAL: no distress. Pulses: dorsalis pedis intact bilat.   MSK: no deformity of the feet CV: no leg edema Skin:  no ulcer on the feet.  normal color and temp on the feet. Neuro: sensation is intact to touch on the feet.    Lab Results  Component Value Date   HGBA1C 6.0 02/13/2015      Assessment & Plan:  DM: overcontrolled  Patient is advised the following: Patient Instructions  Please reduce the repaglinide to lunch and supper only.  check your blood sugar once a day.  vary the time of day when you check, between before the 3 meals, and at bedtime.  also check if you have symptoms of your blood sugar being too high or too low.  please keep a record of the readings and bring it to your next appointment here.  please call us sooner if your blood sugar goes below 70, or if you have a lot of readings over 200.  Please come back for a follow-up appointment in 3-4 months.

## 2015-02-13 NOTE — Patient Instructions (Addendum)
Please reduce the repaglinide to lunch and supper only.  check your blood sugar once a day.  vary the time of day when you check, between before the 3 meals, and at bedtime.  also check if you have symptoms of your blood sugar being too high or too low.  please keep a record of the readings and bring it to your next appointment here.  please call us sooner if your blood sugar goes below 70, or if you have a lot of readings over 200.  Please come back for a follow-up appointment in 3-4 months.

## 2015-02-27 ENCOUNTER — Other Ambulatory Visit: Payer: Self-pay | Admitting: Endocrinology

## 2015-02-27 ENCOUNTER — Telehealth: Payer: Self-pay | Admitting: Endocrinology

## 2015-02-27 ENCOUNTER — Other Ambulatory Visit: Payer: Self-pay | Admitting: Physician Assistant

## 2015-02-27 MED ORDER — CANAGLIFLOZIN 300 MG PO TABS: 300.0000 mg | ORAL_TABLET | Freq: Every day | ORAL | Status: DC

## 2015-02-27 NOTE — Telephone Encounter (Signed)
Rx faxed.    KP 

## 2015-02-27 NOTE — Telephone Encounter (Signed)
Patient called stating that he would like a refill on his medication   Rx: [place]: [facility]    Thank you

## 2015-04-29 ENCOUNTER — Ambulatory Visit (INDEPENDENT_AMBULATORY_CARE_PROVIDER_SITE_OTHER): Payer: BLUE CROSS/BLUE SHIELD

## 2015-04-29 ENCOUNTER — Ambulatory Visit (INDEPENDENT_AMBULATORY_CARE_PROVIDER_SITE_OTHER): Payer: BLUE CROSS/BLUE SHIELD | Admitting: Podiatry

## 2015-04-29 ENCOUNTER — Encounter: Payer: Self-pay | Admitting: Podiatry

## 2015-04-29 VITALS — BP 129/80 | HR 62 | Resp 12

## 2015-04-29 DIAGNOSIS — M79672 Pain in left foot: Secondary | ICD-10-CM

## 2015-04-29 DIAGNOSIS — M7741 Metatarsalgia, right foot: Secondary | ICD-10-CM | POA: Diagnosis not present

## 2015-04-29 DIAGNOSIS — M79671 Pain in right foot: Secondary | ICD-10-CM | POA: Diagnosis not present

## 2015-04-29 DIAGNOSIS — M7742 Metatarsalgia, left foot: Secondary | ICD-10-CM

## 2015-04-29 DIAGNOSIS — E119 Type 2 diabetes mellitus without complications: Secondary | ICD-10-CM | POA: Diagnosis not present

## 2015-04-29 NOTE — Progress Notes (Signed)
   Subjective:    Patient ID: [name], male    DOB: 1950/08/01, 65 y.o.   MRN: [id number]  HPI   This patient presents today playing of bilateral pain, burning in the plantar MPJ and heel area after standing and walking 2-3 hours for the past 3 years more noticeable the last 2 years. Patient states that he's had several peers orthotics which he wears it certain shoes which reduces somewhat of the discomfort. He says that he had a pair of a [employer] shoes in the past that after standing walking controlled her reduce the amount of burning in pain, however, this shoe is no longer available. Patient is stay job is sedentary and he occasionally only stands and walks 2-3 hours at a time.  Patient is a type II diabetic and denies any history of amputation, claudication or skin ulceration  Review of Systems  Eyes: Positive for redness.  Genitourinary: Positive for frequency.       Objective:   Physical Exam  Orientated 3  Vascular: No peripheral edema bilaterally DP and PT pulses 2/4 bilaterally Capillary reflex immediate bilaterally  Neurological: Sensation to 10 g monofilament wire intact 5/5 bilaterally Vibratory sensation intact bilaterally Ankle reflex equal and react reactive bilaterally  Dermatological: Texture and turgor within normal limits No skin lesions bilaterally Mild atrophy MPJ fat-pad bilaterally  Musculoskeletal: Pes planus bilaterally HAV bilaterally There is no pain or crepitus in range of motion ankle, subtalar, midtarsal joints bilaterally Mild palpable tenderness plantar basis second through fifth MPJ  without any palpable lesions, bilaterally  Patient has existing semirigid intrinsic and forefoot orthotic that extends proximal to the MPJ and contour satisfactorily as in satisfactory condition dispensed approximately 2011  X-ray examination weightbearing right foot dated 04/29/2015 Intact bony structure without fracture or dislocation Pes  planus HAV Mild decreased joint space right first MPJ Radiographic impression: Bone density in general joint spaces are adequate in all views Radiographic impression: No acute bony abnormality noted in the right foot dated 04/29/2015  X-ray examination weightbearing left foot dated 04/29/2015 Intact bony structure without fracture and/or dislocation HAV Decreased joint space first MPJ Pes planus Bone density and joint spaces in general are adequate in all views Radiographic impression: No acute bony abnormality noted x-ray left foot dated 04/29/2015           Assessment & Plan:   Assessment: Diabetic without foot complications Metatarsalgia bilaterally  Plan: Today I reviewed the results of the x-ray examination. I made patient aware that he had satisfactory neurovascular status. I recommended that he wear an additional thin rubber insole with a metatarsal raise and place inside a walking or running style shoe . Also, made patient aware they could offer replace them full length orthotic with a metatarsal raise if patient requested  Reappoint at patient's request

## 2015-04-29 NOTE — Patient Instructions (Signed)
Today your diabetic foot examination revealed adequate circulation and feeling in your right and left feet. The pain that occurs in the ball of the right and left feet after standing 2-3 hours vaguely as described as metatarsalgia, achiness in the balls of your feetI I Am recommending an additional thin pad )metatarsal raise) inside a walking or running style shoe that has a metatarsal raise as we discussed in the office Also, could consider using over-the-counter very thin rubbery pad full length with metatarsal raise Return as needed   Diabetes and Foot Care Diabetes may cause you to have problems because of poor blood supply (circulation) to your feet and legs. This may cause the skin on your feet to become thinner, break easier, and heal more slowly. Your skin may become dry, and the skin may peel and crack. You may also have nerve damage in your legs and feet causing decreased feeling in them. You may not notice minor injuries to your feet that could lead to infections or more serious problems. Taking care of your feet is one of the most important things you can do for yourself.  HOME CARE INSTRUCTIONS  Wear shoes at all times, even in the house. Do not go barefoot. Bare feet are easily injured.  Check your feet daily for blisters, cuts, and redness. If you cannot see the bottom of your feet, use a mirror or ask someone for help.  Wash your feet with warm water (do not use hot water) and mild soap. Then pat your feet and the areas between your toes until they are completely dry. Do not soak your feet as this can dry your skin.  Apply a moisturizing lotion or petroleum jelly (that does not contain alcohol and is unscented) to the skin on your feet and to dry, brittle toenails. Do not apply lotion between your toes.  Trim your toenails straight across. Do not dig under them or around the cuticle. File the edges of your nails with an emery board or nail file.  Do not cut corns or calluses or  try to remove them with medicine.  Wear clean socks or stockings every day. Make sure they are not too tight. Do not wear knee-high stockings since they may decrease blood flow to your legs.  Wear shoes that fit properly and have enough cushioning. To break in new shoes, wear them for just a few hours a day. This prevents you from injuring your feet. Always look in your shoes before you put them on to be sure there are no objects inside.  Do not cross your legs. This may decrease the blood flow to your feet.  If you find a minor scrape, cut, or break in the skin on your feet, keep it and the skin around it clean and dry. These areas may be cleansed with mild soap and water. Do not cleanse the area with peroxide, alcohol, or iodine.  When you remove an adhesive bandage, be sure not to damage the skin around it.  If you have a wound, look at it several times a day to make sure it is healing.  Do not use heating pads or hot water bottles. They may burn your skin. If you have lost feeling in your feet or legs, you may not know it is happening until it is too late.  Make sure your health care provider performs a complete foot exam at least annually or more often if you have foot problems. Report any cuts, sores, or  bruises to your health care provider immediately. SEEK MEDICAL CARE IF:   You have an injury that is not healing.  You have cuts or breaks in the skin.  You have an ingrown nail.  You notice redness on your legs or feet.  You feel burning or tingling in your legs or feet.  You have pain or cramps in your legs and feet.  Your legs or feet are numb.  Your feet always feel cold. SEEK IMMEDIATE MEDICAL CARE IF:   There is increasing redness, swelling, or pain in or around a wound.  There is a red line that goes up your leg.  Pus is coming from a wound.  You develop a fever or as directed by your health care provider.  You notice a bad smell coming from an ulcer or  wound.   This information is not intended to replace advice given to you by your health care provider. Make sure you discuss any questions you have with your health care provider.   Document Released: 12/18/1999 Document Revised: 08/22/2012 Document Reviewed: 05/29/2012 Elsevier Interactive Patient Education [employer].

## 2015-05-21 ENCOUNTER — Encounter: Payer: Self-pay | Admitting: Endocrinology

## 2015-05-21 ENCOUNTER — Ambulatory Visit (INDEPENDENT_AMBULATORY_CARE_PROVIDER_SITE_OTHER): Payer: BLUE CROSS/BLUE SHIELD | Admitting: Endocrinology

## 2015-05-21 VITALS — BP 126/80 | HR 64 | Temp 98.0°F | Ht 67.0 in | Wt 167.0 lb

## 2015-05-21 DIAGNOSIS — E1159 Type 2 diabetes mellitus with other circulatory complications: Secondary | ICD-10-CM | POA: Diagnosis not present

## 2015-05-21 LAB — POCT GLYCOSYLATED HEMOGLOBIN (HGB A1C): HEMOGLOBIN A1C: 6.8

## 2015-05-21 NOTE — Patient Instructions (Signed)
Please continue the same medications. Please come back for a follow-up appointment in 4-6 months  check your blood sugar once a day.  vary the time of day when you check, between before the 3 meals, and at bedtime.  also check if you have symptoms of your blood sugar being too high or too low.  please keep a record of the readings and bring it to your next appointment here (or you can bring the meter itself).  You can write it on any piece of paper.  please call us sooner if your blood sugar goes below 70, or if you have a lot of readings over 200.   

## 2015-05-21 NOTE — Progress Notes (Signed)
Subjective:    Patient ID: [name], male    DOB: [birth year], 65 y.o.   MRN: [id number]  HPI Pt returns for f/u of diabetes mellitus: DM type: 2 (but due to lean body habitus, he is felt to be possibly developing type 1) Dx'ed: 99991111 Complications: cerebrovascular disease Therapy: 5 oral meds DKA: never Severe hypoglycemia: never Pancreatitis: never. Other: he can't take pioglitizone, due to edema; he has never taken insulin, but he has learned how.  Interval history: no cbg record, but states cbg's are well-controlled.  pt states he feels well in general.  It is highest later in the day.  Past Medical History  Diagnosis Date  . History of bronchitis   . Hypertension   . Cerebrovascular disease, unspecified   . Other and unspecified hyperlipidemia     Hypercholesterolemia, Borderline  . Diabetes mellitus   . Testicular hypofunction   . Impotence of organic origin   . Knee pain     Right    Past Surgical History  Procedure Laterality Date  . Shoulder surgery Right 2004, 1990  . Wisdom tooth extraction      Social History   Social History  . Marital Status: Married    Spouse Name: [name]  . Number of Children: 0  . Years of Education: N/A   Occupational History  .     Social History Main Topics  . Smoking status: Never Smoker   . Smokeless tobacco: Never Used  . Alcohol Use: 0.0 oz/week    0 Standard drinks or equivalent per week     Comment: rare beer  . Drug Use: No  . Sexual Activity: Yes   Other Topics Concern  . Not on file   Social History Narrative   Regular exercise-yes   Caffeine-2 cups   Married=wife [name] 22 years   Cigar Smoker-quit smoking Jan 2010    Current Outpatient Prescriptions on File Prior to Visit  Medication Sig Dispense Refill  . acyclovir (ZOVIRAX) 800 MG tablet Take 800 mg by mouth daily.     [name] aspirin 81 MG tablet Take 81 mg by mouth daily.      [name] atenolol (TENORMIN) 50 MG tablet TAKE 1 TABLET DAILY (REPLACES  BYSTOLIC) 90 tablet 1  . bromocriptine (PARLODEL) 2.5 MG tablet Take 1 tablet (2.5 mg total) by mouth at bedtime. 90 tablet 2  . canagliflozin (INVOKANA) 300 MG TABS tablet Take 1 tablet (300 mg total) by mouth daily. 90 tablet 1  . hydrochlorothiazide (HYDRODIURIL) 12.5 MG tablet Take 1 tablet (12.5 mg total) by mouth daily. 90 tablet 3  . linagliptin (TRADJENTA) 5 MG TABS tablet Take 1 tablet (5 mg total) by mouth daily. 30 tablet 11  . meloxicam (MOBIC) 7.5 MG tablet TAKE ONE TABLET BY MOUTH EVERY DAY AS NEEDED FOR  ARTHRITIS  PAIN 30 tablet 6  . metFORMIN (GLUCOPHAGE-XR) 500 MG 24 hr tablet Take 2 tablets (1,000 mg total) by mouth 2 (two) times daily. 360 tablet 0  . Multiple Vitamin (MULTIVITAMIN) tablet Take 1 tablet by mouth daily.      . repaglinide (PRANDIN) 0.5 MG tablet Take 1 tablet (0.5 mg total) by mouth 2 (two) times daily before a meal. 180 tablet 3  . rosuvastatin (CRESTOR) 5 MG tablet TAKE 1 TABLET DAILY 90 tablet 0  . sildenafil (VIAGRA) 100 MG tablet Take 100 mg by mouth daily as needed. Use as directed     . tamsulosin (FLOMAX) 0.4 MG CAPS capsule Take 1  capsule (0.4 mg total) by mouth daily. 30 capsule 3  . Testosterone (ANDROGEL) 20.25 MG/1.25GM (1.62%) GEL Apply [2] pumps to each to shoulders every morning     No current facility-administered medications on file prior to visit.    Allergies  Allergen Reactions  . Actos [Pioglitazone]     edema  . Azithromycin     REACTION: rash  . Tetracycline     REACTION: rash  . Trandolapril     REACTION: ALLERGIC to ACEs w/ Angioedema  . Other Rash    MAGIC MOUTHWASH: CAUSES RASH    Family History  Problem Relation Age of Onset  . Heart disease Mother 70    Deceased  . Diabetes Father 38    Deceased  . Heart disease Sister     heart trouble  . Diabetes Brother   . Colon cancer Neg Hx   . Heart attack Mother   . Hypertension Mother   . Hypertension Father   . Cancer Paternal Aunt     x1-Bone  . Cancer Paternal  Aunt     multiple  . Heart attack Maternal Grandmother   . Healthy Sister     x6    BP 126/80 mmHg  Pulse 64  Temp(Src) 98 F (36.7 C) (Oral)  Ht 5\' 7"  (1.702 m)  Wt 167 lb (75.751 kg)  BMI 26.15 kg/m2  SpO2 99%  Review of Systems He denies hypoglycemia    Objective:   Physical Exam VITAL SIGNS:  See vs page GENERAL: no distress Pulses: dorsalis pedis intact bilat.   MSK: no deformity of the feet CV: no leg edema Skin:  no ulcer on the feet.  normal color and temp on the feet. Neuro: sensation is intact to touch on the feet.     A1c=6.8%    Assessment & Plan:  DM: well-controlled  Patient is advised the following: Patient Instructions  Please continue the same medications. Please come back for a follow-up appointment in 4-6 months check your blood sugar once a day.  vary the time of day when you check, between before the 3 meals, and at bedtime.  also check if you have symptoms of your blood sugar being too high or too low.  please keep a record of the readings and bring it to your next appointment here (or you can bring the meter itself).  You can write it on any piece of paper.  please call us sooner if your blood sugar goes below 70, or if you have a lot of readings over 200.

## 2015-06-03 ENCOUNTER — Other Ambulatory Visit: Payer: Self-pay | Admitting: Physician Assistant

## 2015-06-03 NOTE — Telephone Encounter (Signed)
Rx sent to the pharmacy by e-script.   However, the patient needs further evaluation and/or laboratory testing before further refills are given. Ask him to make an appointment for this.//AB/CMA

## 2015-07-03 ENCOUNTER — Other Ambulatory Visit: Payer: Self-pay | Admitting: Endocrinology

## 2015-07-15 ENCOUNTER — Encounter: Payer: Self-pay | Admitting: Podiatry

## 2015-07-15 ENCOUNTER — Ambulatory Visit (INDEPENDENT_AMBULATORY_CARE_PROVIDER_SITE_OTHER): Payer: BLUE CROSS/BLUE SHIELD | Admitting: Podiatry

## 2015-07-15 ENCOUNTER — Ambulatory Visit (INDEPENDENT_AMBULATORY_CARE_PROVIDER_SITE_OTHER): Payer: BLUE CROSS/BLUE SHIELD

## 2015-07-15 DIAGNOSIS — S93602A Unspecified sprain of left foot, initial encounter: Secondary | ICD-10-CM | POA: Diagnosis not present

## 2015-07-15 DIAGNOSIS — M79671 Pain in right foot: Secondary | ICD-10-CM | POA: Diagnosis not present

## 2015-07-15 DIAGNOSIS — M779 Enthesopathy, unspecified: Secondary | ICD-10-CM | POA: Diagnosis not present

## 2015-07-15 DIAGNOSIS — E119 Type 2 diabetes mellitus without complications: Secondary | ICD-10-CM | POA: Diagnosis not present

## 2015-07-15 DIAGNOSIS — M79672 Pain in left foot: Secondary | ICD-10-CM

## 2015-07-15 MED ORDER — TRIAMCINOLONE ACETONIDE 10 MG/ML IJ SUSP
10.0000 mg | Freq: Once | INTRAMUSCULAR | Status: AC
  Administered 2015-07-15: 10 mg

## 2015-07-15 NOTE — Progress Notes (Signed)
Subjective:     Patient ID: [name], male   DOB: 09/22/1950, 65 y.o.   MRN: [id number]  HPI patient presents stating he's getting pain in his left ankle and he did sprain his ankle but this is simply not going away   Review of Systems  All other systems reviewed and are negative.      Objective:   Physical Exam  Constitutional: He is oriented to person, place, and time.  Cardiovascular: Intact distal pulses.   Musculoskeletal: Normal range of motion.  Neurological: He is oriented to person, place, and time.  Skin: Skin is warm.  Nursing note and vitals reviewed.  Neurovascular status intact muscle strength adequate range of motion within normal limits with patient found to have discomfort in the medial ankle left around posterior tibial tendon with no indication of tendon dysfunction but does have flatfoot deformity and has old orthotics from 3 years ago that he cannot wear and a lot of his shoes. The lateral ankle is slightly tender but not edematous     Assessment:     Probability for posterior tibial tendon irritation secondary to injury with no current indication of tear but does have flatfoot deformity    Plan:     H&P x-rays reviewed and careful sheath injection administered 3 mg Dexon some Kenalog 5 mg Xylocaine and applied fascial brace with discussions for long-term orthotics. Patient will be seen back in 3 weeks or earlier if needed  X-ray report indicates there is moderate depression of the arch left with no indication of fracture

## 2015-08-07 ENCOUNTER — Telehealth: Payer: Self-pay | Admitting: Physician Assistant

## 2015-08-07 MED ORDER — ATENOLOL 50 MG PO TABS: ORAL_TABLET | ORAL | 0 refills | Status: DC

## 2015-08-07 NOTE — Telephone Encounter (Signed)
Rx [15-day supply] request to pharmacy/SLS

## 2015-08-07 NOTE — Telephone Encounter (Signed)
°  Relation to [id number] Call back number:[phone] Pharmacy:[email or web address]  Reason for call: pt would like to know if he can get a 30 day supply for rx atenolol (TENORMIN) 50 MG tablet , states his mail order will not arrive until a couple of weeks, pt only have about 7 pills left. Pt will be on vacation and does not want to go without meds. Need rx sent to his local pharmacy until he gets his mail order.

## 2015-08-11 ENCOUNTER — Other Ambulatory Visit: Payer: Self-pay | Admitting: *Deleted

## 2015-08-11 MED ORDER — ATENOLOL 50 MG PO TABS: ORAL_TABLET | ORAL | 0 refills | Status: DC

## 2015-08-11 NOTE — Progress Notes (Signed)
Patient request short term supply of Atenolol while awaiting Mail Order from [facility] [place]; sent #[id number] to local pharmacy; also received fax from [facility] for medication, completed #90 and faxed back/[facility]S 08/08

## 2015-08-17 ENCOUNTER — Encounter: Payer: Self-pay | Admitting: Adult Health

## 2015-09-09 ENCOUNTER — Other Ambulatory Visit: Payer: Self-pay | Admitting: Physician Assistant

## 2015-09-21 ENCOUNTER — Ambulatory Visit (INDEPENDENT_AMBULATORY_CARE_PROVIDER_SITE_OTHER): Payer: BLUE CROSS/BLUE SHIELD | Admitting: Endocrinology

## 2015-09-21 ENCOUNTER — Encounter: Payer: Self-pay | Admitting: Endocrinology

## 2015-09-21 VITALS — BP 132/64 | HR 61 | Ht 67.0 in | Wt 172.0 lb

## 2015-09-21 DIAGNOSIS — E1159 Type 2 diabetes mellitus with other circulatory complications: Secondary | ICD-10-CM | POA: Diagnosis not present

## 2015-09-21 LAB — POCT GLYCOSYLATED HEMOGLOBIN (HGB A1C): HEMOGLOBIN A1C: 6.9

## 2015-09-21 MED ORDER — DAPAGLIFLOZIN PROPANEDIOL 10 MG PO TABS: 10.0000 mg | ORAL_TABLET | Freq: Every day | ORAL | 11 refills | Status: DC

## 2015-09-21 NOTE — Patient Instructions (Addendum)
I have sent a prescription to your pharmacy, for an alternative for the invokana. Please continue the same other medications Please come back for a follow-up appointment in 4-6 months.  check your blood sugar once a day.  vary the time of day when you check, between before the 3 meals, and at bedtime.  also check if you have symptoms of your blood sugar being too high or too low.  please keep a record of the readings and bring it to your next appointment here (or you can bring the meter itself).  You can write it on any piece of paper.  please call us sooner if your blood sugar goes below 70, or if you have a lot of readings over 200.

## 2015-09-21 NOTE — Progress Notes (Signed)
Subjective:    Patient ID: [name], male    DOB: 1950/04/09, 65 y.o.   MRN: [id number]  HPI Pt returns for f/u of diabetes mellitus: DM type: 2 (but due to lean body habitus, he is felt to be possibly developing type 1) Dx'ed: 99991111 Complications: cerebrovascular disease Therapy: 5 oral meds DKA: never Severe hypoglycemia: never Pancreatitis: never. Other: he can't take pioglitizone, due to edema; he has never taken insulin, but he has learned how.  Interval history: He stopped invokana 2 weeks ago, due to media reports.  pt states he feels well in general. Past Medical History:  Diagnosis Date  . Cerebrovascular disease, unspecified   . Diabetes mellitus   . History of bronchitis   . Hypertension   . Impotence of organic origin   . Knee pain    Right  . Other and unspecified hyperlipidemia    Hypercholesterolemia, Borderline  . Testicular hypofunction     Past Surgical History:  Procedure Laterality Date  . SHOULDER SURGERY Right 2004, 1990  . WISDOM TOOTH EXTRACTION      Social History   Social History  . Marital status: Married    Spouse name: [name]  . Number of children: 0  . Years of education: N/A   Occupational History  .  [employer]   Social History Main Topics  . Smoking status: Never Smoker  . Smokeless tobacco: Never Used  . Alcohol use 0.0 oz/week     Comment: rare beer  . Drug use: No  . Sexual activity: Yes   Other Topics Concern  . Not on file   Social History Narrative   Regular exercise-yes   Caffeine-2 cups   Married=wife [name] 22 years   Cigar Smoker-quit smoking Jan 2010    Current Outpatient Prescriptions on File Prior to Visit  Medication Sig Dispense Refill  . acyclovir (ZOVIRAX) 800 MG tablet Take 800 mg by mouth daily.     [name] aspirin 81 MG tablet Take 81 mg by mouth daily.      [name] atenolol (TENORMIN) 50 MG tablet TAKE 1 TABLET DAILY (REPLACES BYSTOLIC) 15 tablet 0  . bromocriptine (PARLODEL) 2.5 MG  tablet Take 1 tablet (2.5 mg total) by mouth at bedtime. 90 tablet 2  . diclofenac (VOLTAREN) 75 MG EC tablet Take 75 mg by mouth 2 (two) times daily.    . hydrochlorothiazide (HYDRODIURIL) 12.5 MG tablet Take 1 tablet (12.5 mg total) by mouth daily. 90 tablet 3  . linagliptin (TRADJENTA) 5 MG TABS tablet Take 1 tablet (5 mg total) by mouth daily. 30 tablet 11  . meloxicam (MOBIC) 7.5 MG tablet TAKE ONE TABLET BY MOUTH EVERY DAY AS NEEDED FOR  ARTHRITIS  PAIN 30 tablet 6  . metFORMIN (GLUCOPHAGE-XR) 500 MG 24 hr tablet TAKE 2 TABLETS TWICE A DAY 360 tablet 0  . Multiple Vitamin (MULTIVITAMIN) tablet Take 1 tablet by mouth daily.      . repaglinide (PRANDIN) 0.5 MG tablet Take 1 tablet (0.5 mg total) by mouth 2 (two) times daily before a meal. 180 tablet 3  . rosuvastatin (CRESTOR) 5 MG tablet TAKE 1 TABLET DAILY (NEED FURTHER EVALUATION AND/OR LABORATORY TESTING BEFORE FURTHER REFILLS ARE GIVEN, MAKE AN APPOINTMENT FOR THIS) 90 tablet 0  . sildenafil (VIAGRA) 100 MG tablet Take 100 mg by mouth daily as needed. Use as directed     . tamsulosin (FLOMAX) 0.4 MG CAPS capsule Take 1 capsule (0.4 mg total) by mouth daily. 30 capsule  3  . Testosterone (ANDROGEL) 20.25 MG/1.25GM (1.62%) GEL Apply [2] pumps to each to shoulders every morning     No current facility-administered medications on file prior to visit.     Allergies  Allergen Reactions  . Actos [Pioglitazone]     edema  . Azithromycin     REACTION: rash  . Tetracycline     REACTION: rash  . Trandolapril     REACTION: ALLERGIC to ACEs w/ Angioedema  . Other Rash    MAGIC MOUTHWASH: CAUSES RASH    Family History  Problem Relation Age of Onset  . Heart disease Mother 83    Deceased  . Heart attack Mother   . Hypertension Mother   . Diabetes Father 64    Deceased  . Hypertension Father   . Heart disease Sister     heart trouble  . Diabetes Brother   . Cancer Paternal Aunt     x1-Bone  . Cancer Paternal Aunt     multiple  .  Heart attack Maternal Grandmother   . Healthy Sister     x6  . Colon cancer Neg Hx     BP 132/64   Pulse 61   Ht 5\' 7"  (1.702 m)   Wt 172 lb (78 kg)   SpO2 98%   BMI 26.94 kg/m    Review of Systems Denies weight change    Objective:   Physical Exam VITAL SIGNS:  See vs page GENERAL: no distress Pulses: dorsalis pedis intact bilat.   MSK: no deformity of the feet CV: no leg edema Skin:  no ulcer on the feet.  normal color and temp on the feet. Neuro: sensation is intact to touch on the feet.     A1c=6.9%    Assessment & Plan:  Type 2 DM: well-controlled

## 2015-09-22 ENCOUNTER — Encounter: Payer: Self-pay | Admitting: Physician Assistant

## 2015-09-22 ENCOUNTER — Ambulatory Visit (INDEPENDENT_AMBULATORY_CARE_PROVIDER_SITE_OTHER): Payer: BLUE CROSS/BLUE SHIELD | Admitting: Physician Assistant

## 2015-09-22 VITALS — BP 118/72 | HR 72 | Temp 97.7°F | Ht 67.0 in | Wt 173.1 lb

## 2015-09-22 DIAGNOSIS — I10 Essential (primary) hypertension: Secondary | ICD-10-CM

## 2015-09-22 DIAGNOSIS — Z23 Encounter for immunization: Secondary | ICD-10-CM

## 2015-09-22 DIAGNOSIS — E785 Hyperlipidemia, unspecified: Secondary | ICD-10-CM | POA: Diagnosis not present

## 2015-09-22 LAB — LIPID PANEL
CHOL/HDL RATIO: 3
Cholesterol: 159 mg/dL (ref 0–200)
HDL: 48.3 mg/dL (ref >39.00)
LDL CALC: 95 mg/dL (ref 0–99)
NONHDL: 111.01
Triglycerides: 78 mg/dL (ref 0.0–149.0)
VLDL: 15.6 mg/dL (ref 0.0–40.0)

## 2015-09-22 LAB — COMPREHENSIVE METABOLIC PANEL
ALT: 33 U/L (ref 0–53)
AST: 27 U/L (ref 0–37)
Albumin: 4.1 g/dL (ref 3.5–5.2)
Alkaline Phosphatase: 66 U/L (ref 39–117)
BILIRUBIN TOTAL: 0.3 mg/dL (ref 0.2–1.2)
BUN: 15 mg/dL (ref 6–23)
CHLORIDE: 99 meq/L (ref 96–112)
CO2: 31 meq/L (ref 19–32)
CREATININE: 1 mg/dL (ref 0.40–1.50)
Calcium: 9.1 mg/dL (ref 8.4–10.5)
GFR: 96.76 mL/min (ref >60.00)
GLUCOSE: 157 mg/dL — AB (ref 70–99)
Potassium: 4.2 mEq/L (ref 3.5–5.1)
Sodium: 136 mEq/L (ref 135–145)
Total Protein: 7 g/dL (ref 6.0–8.3)

## 2015-09-22 MED ORDER — ROSUVASTATIN CALCIUM 5 MG PO TABS: ORAL_TABLET | ORAL | 0 refills | Status: DC

## 2015-09-22 NOTE — Addendum Note (Signed)
Addended by: Rockwell Germany on: 09/22/2015 08:59 AM   Modules accepted: Orders

## 2015-09-22 NOTE — Progress Notes (Signed)
Pre visit review using our clinic review tool, if applicable. No additional management support is needed unless otherwise documented below in the visit note. 

## 2015-09-22 NOTE — Assessment & Plan Note (Signed)
BP stable. Asymptomatic. Continue current regimen. Will check CMP today.

## 2015-09-22 NOTE — Patient Instructions (Signed)
Please go to the lab for blood work. I will call you with your results.  Please continue chronic medications as directed. Follow-up with your specialists as directed.  I recommend you schedule a visit for a complete physical at your earliest convenience.

## 2015-09-22 NOTE — Progress Notes (Signed)
Patient presents to clinic today for follow-up of hypertension and hyperlipidemia.  Hypertension -- Patient is currently on a regimen of Atenolol 50 mg once daily, HCTZ 12.5 mg daily and 81 mg ASA daily. Is taking medications as directed. Patient denies chest pain, palpitations, lightheadedness, dizziness, vision changes or frequent headaches.  BP Readings from Last 3 Encounters:  09/22/15 118/72  09/21/15 132/64  05/21/15 126/80   Hyperlipidemia -- Patient is currently on a combination of 81 mg ASA daily and Rosuvastatin 5 mg daily. Is taking medication as directed. Endorses well-balanced diet overall. Is staying active. Body mass index is 27.12 kg/m.  Past Medical History:  Diagnosis Date  . Cerebrovascular disease, unspecified   . Diabetes mellitus   . History of bronchitis   . Hypertension   . Impotence of organic origin   . Knee pain    Right  . Other and unspecified hyperlipidemia    Hypercholesterolemia, Borderline  . Testicular hypofunction     Current Outpatient Prescriptions on File Prior to Visit  Medication Sig Dispense Refill  . acyclovir (ZOVIRAX) 800 MG tablet Take 800 mg by mouth daily.     [name] aspirin 81 MG tablet Take 81 mg by mouth daily.      [name] atenolol (TENORMIN) 50 MG tablet TAKE 1 TABLET DAILY (REPLACES BYSTOLIC) 15 tablet 0  . bromocriptine (PARLODEL) 2.5 MG tablet Take 1 tablet (2.5 mg total) by mouth at bedtime. 90 tablet 2  . dapagliflozin propanediol (FARXIGA) 10 MG TABS tablet Take 10 mg by mouth daily. 30 tablet 11  . diclofenac (VOLTAREN) 75 MG EC tablet Take 75 mg by mouth 2 (two) times daily.    . hydrochlorothiazide (HYDRODIURIL) 12.5 MG tablet Take 1 tablet (12.5 mg total) by mouth daily. 90 tablet 3  . linagliptin (TRADJENTA) 5 MG TABS tablet Take 1 tablet (5 mg total) by mouth daily. 30 tablet 11  . meloxicam (MOBIC) 7.5 MG tablet TAKE ONE TABLET BY MOUTH EVERY DAY AS NEEDED FOR  ARTHRITIS  PAIN 30 tablet 6  . metFORMIN (GLUCOPHAGE-XR) 500  MG 24 hr tablet TAKE 2 TABLETS TWICE A DAY 360 tablet 0  . Multiple Vitamin (MULTIVITAMIN) tablet Take 1 tablet by mouth daily.      . repaglinide (PRANDIN) 0.5 MG tablet Take 1 tablet (0.5 mg total) by mouth 2 (two) times daily before a meal. 180 tablet 3  . sildenafil (VIAGRA) 100 MG tablet Take 100 mg by mouth daily as needed. Use as directed     . tamsulosin (FLOMAX) 0.4 MG CAPS capsule Take 1 capsule (0.4 mg total) by mouth daily. 30 capsule 3  . Testosterone (ANDROGEL) 20.25 MG/1.25GM (1.62%) GEL Apply [2] pumps to each to shoulders every morning     No current facility-administered medications on file prior to visit.     Allergies  Allergen Reactions  . Actos [Pioglitazone]     edema  . Azithromycin     REACTION: rash  . Tetracycline     REACTION: rash  . Trandolapril     REACTION: ALLERGIC to ACEs w/ Angioedema  . Other Rash    MAGIC MOUTHWASH: CAUSES RASH    Family History  Problem Relation Age of Onset  . Heart disease Mother 9    Deceased  . Heart attack Mother   . Hypertension Mother   . Diabetes Father 108    Deceased  . Hypertension Father   . Heart disease Sister     heart trouble  . Diabetes  Brother   . Cancer Paternal Aunt     x1-Bone  . Cancer Paternal Aunt     multiple  . Heart attack Maternal Grandmother   . Healthy Sister     x6  . Colon cancer Neg Hx     Social History   Social History  . Marital status: Married    Spouse name: [name]  . Number of children: 0  . Years of education: N/A   Occupational History  .  [employer]   Social History Main Topics  . Smoking status: Never Smoker  . Smokeless tobacco: Never Used  . Alcohol use 0.0 oz/week     Comment: rare beer  . Drug use: No  . Sexual activity: Yes   Other Topics Concern  . None   Social History Narrative   Regular exercise-yes   Caffeine-2 cups   Married=wife [name] 22 years   Cigar Smoker-quit smoking Jan 2010    Review of Systems - See HPI.  All other  ROS are negative.  BP 118/72 (BP Location: Left Arm, Patient Position: Sitting, Cuff Size: Normal)   Pulse 72   Temp 97.7 F (36.5 C) (Oral)   Ht 5\' 7"  (1.702 m)   Wt 173 lb 2 oz (78.5 kg)   BMI 27.12 kg/m   Physical Exam  Constitutional: He is oriented to person, place, and time and well-developed, well-nourished, and in no distress.  HENT:  Head: Normocephalic and atraumatic.  Eyes: Conjunctivae are normal.  Neck: Neck supple.  Cardiovascular: Normal rate, regular rhythm, normal heart sounds and intact distal pulses.   Pulmonary/Chest: Effort normal and breath sounds normal. No respiratory distress. He has no wheezes. He has no rales. He exhibits no tenderness.  Neurological: He is alert and oriented to person, place, and time.  Skin: Skin is warm and dry. No rash noted.  Psychiatric: Affect normal.  Vitals reviewed.   Recent Results (from the past 2160 hour(s))  POCT glycosylated hemoglobin (Hb A1C)     Status: None   Collection Time: 09/21/15  9:29 AM  Result Value Ref Range   Hemoglobin A1C 6.9     Assessment/Plan: Hypertension BP stable. Asymptomatic. Continue current regimen. Will check CMP today.   Hyperlipidemia with target LDL less than 100 Will repeat lipid panel and LFT today. Continue statin and 81 mg ASA daily. Diet and exercise recommendations reviewed with patient.     Leeanne Rio, PA-C

## 2015-09-22 NOTE — Assessment & Plan Note (Signed)
Will repeat lipid panel and LFT today. Continue statin and 81 mg ASA daily. Diet and exercise recommendations reviewed with patient.

## 2015-09-25 ENCOUNTER — Telehealth: Payer: Self-pay | Admitting: Endocrinology

## 2015-09-25 MED ORDER — DAPAGLIFLOZIN PROPANEDIOL 10 MG PO TABS: 10.0000 mg | ORAL_TABLET | Freq: Every day | ORAL | 1 refills | Status: DC

## 2015-09-25 NOTE — Telephone Encounter (Signed)
Refill submitted. 

## 2015-09-25 NOTE — Telephone Encounter (Signed)
Patient need a new prescription for a 90 day supply of [place], [place] [phone] (Phone) [phone] (Fax)

## 2015-10-01 ENCOUNTER — Other Ambulatory Visit: Payer: Self-pay | Admitting: Endocrinology

## 2015-10-05 ENCOUNTER — Encounter: Payer: Self-pay | Admitting: Podiatry

## 2015-10-05 ENCOUNTER — Ambulatory Visit (INDEPENDENT_AMBULATORY_CARE_PROVIDER_SITE_OTHER): Payer: BLUE CROSS/BLUE SHIELD | Admitting: Podiatry

## 2015-10-05 VITALS — BP 122/79 | HR 65 | Resp 16

## 2015-10-05 DIAGNOSIS — M779 Enthesopathy, unspecified: Secondary | ICD-10-CM

## 2015-10-05 DIAGNOSIS — M2142 Flat foot [pes planus] (acquired), left foot: Secondary | ICD-10-CM

## 2015-10-05 DIAGNOSIS — M76822 Posterior tibial tendinitis, left leg: Secondary | ICD-10-CM

## 2015-10-05 NOTE — Progress Notes (Signed)
Subjective:     Patient ID: [name], male   DOB: 1950/05/09, 65 y.o.   MRN: [id number]  HPI patient presents stating that he still getting a lot of pain in his left ankle and that it's worse after periods of activity   Review of Systems     Objective:   Physical Exam Neurovascular status intact with patient having relatively weak ankle left with excessive promontory symptomatology and pain posterior tibial at its insertion into the navicular    Assessment:     Chronic tendinitis with posterior tibial dysfunction with possibility for interstitial tear of the posterior tibial tendon    Plan:     Reviewed condition and recommended immobilization with a Berkley type orthotic with a consideration long-term for utilization of MRI if symptoms do not improve. I did apply a air fracture walker to completely immobilize the left lower leg and patient was scanned and will be seen back when orthotics returned or earlier if needed

## 2015-10-26 ENCOUNTER — Ambulatory Visit (INDEPENDENT_AMBULATORY_CARE_PROVIDER_SITE_OTHER): Payer: BLUE CROSS/BLUE SHIELD | Admitting: Physician Assistant

## 2015-10-26 ENCOUNTER — Encounter: Payer: Self-pay | Admitting: Physician Assistant

## 2015-10-26 VITALS — BP 124/84 | HR 68 | Temp 97.6°F | Resp 16 | Ht 67.0 in | Wt 173.0 lb

## 2015-10-26 DIAGNOSIS — Z136 Encounter for screening for cardiovascular disorders: Secondary | ICD-10-CM | POA: Insufficient documentation

## 2015-10-26 DIAGNOSIS — Z0184 Encounter for antibody response examination: Secondary | ICD-10-CM

## 2015-10-26 DIAGNOSIS — Z0000 Encounter for general adult medical examination without abnormal findings: Secondary | ICD-10-CM | POA: Diagnosis not present

## 2015-10-26 LAB — COMPREHENSIVE METABOLIC PANEL
ALBUMIN: 4.1 g/dL (ref 3.5–5.2)
ALT: 28 U/L (ref 0–53)
AST: 26 U/L (ref 0–37)
Alkaline Phosphatase: 59 U/L (ref 39–117)
BUN: 18 mg/dL (ref 6–23)
CALCIUM: 9.4 mg/dL (ref 8.4–10.5)
CHLORIDE: 101 meq/L (ref 96–112)
CO2: 31 meq/L (ref 19–32)
Creatinine, Ser: 1.01 mg/dL (ref 0.40–1.50)
GFR: 95.63 mL/min (ref >60.00)
Glucose, Bld: 114 mg/dL — ABNORMAL HIGH (ref 70–99)
POTASSIUM: 3.8 meq/L (ref 3.5–5.1)
Sodium: 139 mEq/L (ref 135–145)
Total Bilirubin: 0.4 mg/dL (ref 0.2–1.2)
Total Protein: 6.9 g/dL (ref 6.0–8.3)

## 2015-10-26 LAB — URINALYSIS, ROUTINE W REFLEX MICROSCOPIC
Bilirubin Urine: NEGATIVE
Hgb urine dipstick: NEGATIVE
KETONES UR: NEGATIVE
Leukocytes, UA: NEGATIVE
Nitrite: NEGATIVE
PH: 5.5 (ref 5.0–8.0)
RBC / HPF: NONE SEEN (ref 0–?)
SPECIFIC GRAVITY, URINE: 1.02 (ref 1.000–1.030)
TOTAL PROTEIN, URINE-UPE24: NEGATIVE
UROBILINOGEN UA: 0.2 (ref 0.0–1.0)

## 2015-10-26 LAB — LIPID PANEL
CHOL/HDL RATIO: 3
CHOLESTEROL: 147 mg/dL (ref 0–200)
HDL: 54.4 mg/dL (ref >39.00)
LDL CALC: 75 mg/dL (ref 0–99)
NonHDL: 92.98
TRIGLYCERIDES: 90 mg/dL (ref 0.0–149.0)
VLDL: 18 mg/dL (ref 0.0–40.0)

## 2015-10-26 LAB — CBC
HEMATOCRIT: 44 % (ref 39.0–52.0)
Hemoglobin: 14 g/dL (ref 13.0–17.0)
MCHC: 31.8 g/dL (ref 30.0–36.0)
MCV: 74.1 fl — AB (ref 78.0–100.0)
PLATELETS: 249 10*3/uL (ref 150.0–400.0)
RBC: 5.95 Mil/uL — AB (ref 4.22–5.81)
RDW: 20.6 % — ABNORMAL HIGH (ref 11.5–15.5)
WBC: 6.3 10*3/uL (ref 4.0–10.5)

## 2015-10-26 LAB — TSH: TSH: 2.13 u[IU]/mL (ref 0.35–4.50)

## 2015-10-26 NOTE — Assessment & Plan Note (Signed)
EKG reveals NSR. BP normotensive. Asymptomatic. Continue current regimen.

## 2015-10-26 NOTE — Patient Instructions (Signed)
Please go to the lab for blood work.   Our office will call you with your results unless you have chosen to receive results via MyChart.  If your blood work is normal we will follow-up each year for physicals and as scheduled for chronic medical problems.  If anything is abnormal we will treat accordingly and get you in for a follow-up.  Preventive Care for Adults, Male A healthy lifestyle and preventive care can promote health and wellness. Preventive health guidelines for men include the following key practices:  A routine yearly physical is a good way to check with your health care provider about your health and preventative screening. It is a chance to share any concerns and updates on your health and to receive a thorough exam.  Visit your dentist for a routine exam and preventative care every 6 months. Brush your teeth twice a day and floss once a day. Good oral hygiene prevents tooth decay and gum disease.  The frequency of eye exams is based on your age, health, family medical history, use of contact lenses, and other factors. Follow your health care provider's recommendations for frequency of eye exams.  Eat a healthy diet. Foods such as vegetables, fruits, whole grains, low-fat dairy products, and lean protein foods contain the nutrients you need without too many calories. Decrease your intake of foods high in solid fats, added sugars, and salt. Eat the right amount of calories for you.Get information about a proper diet from your health care provider, if necessary.  Regular physical exercise is one of the most important things you can do for your health. Most adults should get at least 150 minutes of moderate-intensity exercise (any activity that increases your heart rate and causes you to sweat) each week. In addition, most adults need muscle-strengthening exercises on 2 or more days a week.  Maintain a healthy weight. The body mass index (BMI) is a screening tool to identify  possible weight problems. It provides an estimate of body fat based on height and weight. Your health care provider can find your BMI and can help you achieve or maintain a healthy weight.For adults 20 years and older:  A BMI below 18.5 is considered underweight.  A BMI of 18.5 to 24.9 is normal.  A BMI of 25 to 29.9 is considered overweight.  A BMI of 30 and above is considered obese.  Maintain normal blood lipids and cholesterol levels by exercising and minimizing your intake of saturated fat. Eat a balanced diet with plenty of fruit and vegetables. Blood tests for lipids and cholesterol should begin at age 73 and be repeated every 5 years. If your lipid or cholesterol levels are high, you are over 50, or you are at high risk for heart disease, you may need your cholesterol levels checked more frequently.Ongoing high lipid and cholesterol levels should be treated with medicines if diet and exercise are not working.  If you smoke, find out from your health care provider how to quit. If you do not use tobacco, do not start.  Lung cancer screening is recommended for adults aged 50-80 years who are at high risk for developing lung cancer because of a history of smoking. A yearly low-dose CT scan of the lungs is recommended for people who have at least a 30-pack-year history of smoking and are a current smoker or have quit within the past 15 years. A pack year of smoking is smoking an average of 1 pack of cigarettes a day for 1  year (for example: 1 pack a day for 30 years or 2 packs a day for 15 years). Yearly screening should continue until the smoker has stopped smoking for at least 15 years. Yearly screening should be stopped for people who develop a health problem that would prevent them from having lung cancer treatment.  If you choose to drink alcohol, do not have more than 2 drinks per day. One drink is considered to be 12 ounces (355 mL) of beer, 5 ounces (148 mL) of wine, or 1.5 ounces (44  mL) of liquor.  Avoid use of street drugs. Do not share needles with anyone. Ask for help if you need support or instructions about stopping the use of drugs.  High blood pressure causes heart disease and increases the risk of stroke. Your blood pressure should be checked at least every 1-2 years. Ongoing high blood pressure should be treated with medicines, if weight loss and exercise are not effective.  If you are 22-45 years old, ask your health care provider if you should take aspirin to prevent heart disease.  Diabetes screening is done by taking a blood sample to check your blood glucose level after you have not eaten for a certain period of time (fasting). If you are not overweight and you do not have risk factors for diabetes, you should be screened once every 3 years starting at age 11. If you are overweight or obese and you are 12-39 years of age, you should be screened for diabetes every year as part of your cardiovascular risk assessment.  Colorectal cancer can be detected and often prevented. Most routine colorectal cancer screening begins at the age of 61 and continues through age 68. However, your health care provider may recommend screening at an earlier age if you have risk factors for colon cancer. On a yearly basis, your health care provider may provide home test kits to check for hidden blood in the stool. Use of a small camera at the end of a tube to directly examine the colon (sigmoidoscopy or colonoscopy) can detect the earliest forms of colorectal cancer. Talk to your health care provider about this at age 69, when routine screening begins. Direct exam of the colon should be repeated every 5-10 years through age 62, unless early forms of precancerous polyps or small growths are found.  People who are at an increased risk for hepatitis B should be screened for this virus. You are considered at high risk for hepatitis B if:  You were born in a country where hepatitis B occurs  often. Talk with your health care provider about which countries are considered high risk.  Your parents were born in a high-risk country and you have not received a shot to protect against hepatitis B (hepatitis B vaccine).  You have HIV or AIDS.  You use needles to inject street drugs.  You live with, or have sex with, someone who has hepatitis B.  You are a man who has sex with other men (MSM).  You get hemodialysis treatment.  You take certain medicines for conditions such as cancer, organ transplantation, and autoimmune conditions.  Hepatitis C blood testing is recommended for all people born from [birth year] through 1965 and any individual with known risks for hepatitis C.  Practice safe sex. Use condoms and avoid high-risk sexual practices to reduce the spread of sexually transmitted infections (STIs). STIs include gonorrhea, chlamydia, syphilis, trichomonas, herpes, HPV, and human immunodeficiency virus (HIV). Herpes, HIV, and HPV are viral illnesses  that have no cure. They can result in disability, cancer, and death.  If you are a man who has sex with other men, you should be screened at least once per year for:  HIV.  Urethral, rectal, and pharyngeal infection of gonorrhea, chlamydia, or both.  If you are at risk of being infected with HIV, it is recommended that you take a prescription medicine daily to prevent HIV infection. This is called preexposure prophylaxis (PrEP). You are considered at risk if:  You are a man who has sex with other men (MSM) and have other risk factors.  You are a heterosexual man, are sexually active, and are at increased risk for HIV infection.  You take drugs by injection.  You are sexually active with a partner who has HIV.  Talk with your health care provider about whether you are at high risk of being infected with HIV. If you choose to begin PrEP, you should first be tested for HIV. You should then be tested every 3 months for as long as you are  taking PrEP.  A one-time screening for abdominal aortic aneurysm (AAA) and surgical repair of large AAAs by ultrasound are recommended for men ages 47 to 19 years who are current or former smokers.  Healthy men should no longer receive prostate-specific antigen (PSA) blood tests as part of routine cancer screening. Talk with your health care provider about prostate cancer screening.  Testicular cancer screening is not recommended for adult males who have no symptoms. Screening includes self-exam, a health care provider exam, and other screening tests. Consult with your health care provider about any symptoms you have or any concerns you have about testicular cancer.  Use sunscreen. Apply sunscreen liberally and repeatedly throughout the day. You should seek shade when your shadow is shorter than you. Protect yourself by wearing long sleeves, pants, a wide-brimmed hat, and sunglasses year round, whenever you are outdoors.  Once a month, do a whole-body skin exam, using a mirror to look at the skin on your back. Tell your health care provider about new moles, moles that have irregular borders, moles that are larger than a pencil eraser, or moles that have changed in shape or color.  Stay current with required vaccines (immunizations).  Influenza vaccine. All adults should be immunized every year.  Tetanus, diphtheria, and acellular pertussis (Td, Tdap) vaccine. An adult who has not previously received Tdap or who does not know his vaccine status should receive 1 dose of Tdap. This initial dose should be followed by tetanus and diphtheria toxoids (Td) booster doses every 10 years. Adults with an unknown or incomplete history of completing a 3-dose immunization series with Td-containing vaccines should begin or complete a primary immunization series including a Tdap dose. Adults should receive a Td booster every 10 years.  Varicella vaccine. An adult without evidence of immunity to varicella should  receive 2 doses or a second dose if he has previously received 1 dose.  Human papillomavirus (HPV) vaccine. Males aged 11-21 years who have not received the vaccine previously should receive the 3-dose series. Males aged 22-26 years may be immunized. Immunization is recommended through the age of 16 years for any male who has sex with males and did not get any or all doses earlier. Immunization is recommended for any person with an immunocompromised condition through the age of 63 years if he did not get any or all doses earlier. During the 3-dose series, the second dose should be obtained 4-8 weeks  after the first dose. The third dose should be obtained 24 weeks after the first dose and 16 weeks after the second dose.  Zoster vaccine. One dose is recommended for adults aged 32 years or older unless certain conditions are present.  Measles, mumps, and rubella (MMR) vaccine. Adults born before [birth year] generally are considered immune to measles and mumps. Adults born in [birth year] or later should have 1 or more doses of MMR vaccine unless there is a contraindication to the vaccine or there is laboratory evidence of immunity to each of the three diseases. A routine second dose of MMR vaccine should be obtained at least 28 days after the first dose for students attending postsecondary schools, health care workers, or international travelers. People who received inactivated measles vaccine or an unknown type of measles vaccine during 1963-1967 should receive 2 doses of MMR vaccine. People who received inactivated mumps vaccine or an unknown type of mumps vaccine before 1979 and are at high risk for mumps infection should consider immunization with 2 doses of MMR vaccine. Unvaccinated health care workers born before [birth year] who lack laboratory evidence of measles, mumps, or rubella immunity or laboratory confirmation of disease should consider measles and mumps immunization with 2 doses of MMR vaccine or rubella immunization  with 1 dose of MMR vaccine.  Pneumococcal 13-valent conjugate (PCV13) vaccine. When indicated, a person who is uncertain of his immunization history and has no record of immunization should receive the PCV13 vaccine. All adults 35 years of age and older should receive this vaccine. An adult aged 38 years or older who has certain medical conditions and has not been previously immunized should receive 1 dose of PCV13 vaccine. This PCV13 should be followed with a dose of pneumococcal polysaccharide (PPSV23) vaccine. Adults who are at high risk for pneumococcal disease should obtain the PPSV23 vaccine at least 8 weeks after the dose of PCV13 vaccine. Adults older than 64 years of age who have normal immune system function should obtain the PPSV23 vaccine dose at least 1 year after the dose of PCV13 vaccine.  Pneumococcal polysaccharide (PPSV23) vaccine. When PCV13 is also indicated, PCV13 should be obtained first. All adults aged 11 years and older should be immunized. An adult younger than age 75 years who has certain medical conditions should be immunized. Any person who resides in a nursing home or long-term care facility should be immunized. An adult smoker should be immunized. People with an immunocompromised condition and certain other conditions should receive both PCV13 and PPSV23 vaccines. People with human immunodeficiency virus (HIV) infection should be immunized as soon as possible after diagnosis. Immunization during chemotherapy or radiation therapy should be avoided. Routine use of PPSV23 vaccine is not recommended for American Indians, [place] Natives, or people younger than 65 years unless there are medical conditions that require PPSV23 vaccine. When indicated, people who have unknown immunization and have no record of immunization should receive PPSV23 vaccine. One-time revaccination 5 years after the first dose of PPSV23 is recommended for people aged 19-64 years who have chronic kidney failure,  nephrotic syndrome, asplenia, or immunocompromised conditions. People who received 1-2 doses of PPSV23 before age 31 years should receive another dose of PPSV23 vaccine at age 85 years or later if at least 5 years have passed since the previous dose. Doses of PPSV23 are not needed for people immunized with PPSV23 at or after age 44 years.  Meningococcal vaccine. Adults with asplenia or persistent complement component deficiencies should receive 2 doses of quadrivalent  meningococcal conjugate (MenACWY-D) vaccine. The doses should be obtained at least 2 months apart. Microbiologists working with certain meningococcal bacteria, [employer] recruits, people at risk during an outbreak, and people who travel to or live in countries with a high rate of meningitis should be immunized. A first-year college student up through age 54 years who is living in a residence hall should receive a dose if he did not receive a dose on or after his 16th birthday. Adults who have certain high-risk conditions should receive one or more doses of vaccine.  Hepatitis A vaccine. Adults who wish to be protected from this disease, have chronic liver disease, work with hepatitis A-infected animals, work in hepatitis A research labs, or travel to or work in countries with a high rate of hepatitis A should be immunized. Adults who were previously unvaccinated and who anticipate close contact with an international adoptee during the first 60 days after arrival in the [place] from a country with a high rate of hepatitis A should be immunized.  Hepatitis B vaccine. Adults should be immunized if they wish to be protected from this disease, are under age 30 years and have diabetes, have chronic liver disease, have had more than one sex partner in the past 6 months, may be exposed to blood or other infectious body fluids, are household contacts or sex partners of hepatitis B positive people, are clients or workers in certain care facilities, or  travel to or work in countries with a high rate of hepatitis B.  Haemophilus influenzae type b (Hib) vaccine. A previously unvaccinated person with asplenia or sickle cell disease or having a scheduled splenectomy should receive 1 dose of Hib vaccine. Regardless of previous immunization, a recipient of a hematopoietic stem cell transplant should receive a 3-dose series 6-12 months after his successful transplant. Hib vaccine is not recommended for adults with HIV infection. Preventive Service / Frequency Ages 39 to 81  Blood pressure check.** / Every 3-5 years.  Lipid and cholesterol check.** / Every 5 years beginning at age 30.  Hepatitis C blood test.** / For any individual with known risks for hepatitis C.  Skin self-exam. / Monthly.  Influenza vaccine. / Every year.  Tetanus, diphtheria, and acellular pertussis (Tdap, Td) vaccine.** / Consult your health care provider. 1 dose of Td every 10 years.  Varicella vaccine.** / Consult your health care provider.  HPV vaccine. / 3 doses over 6 months, if 68 or younger.  Measles, mumps, rubella (MMR) vaccine.** / You need at least 1 dose of MMR if you were born in 1957 or later. You may also need a second dose.  Pneumococcal 13-valent conjugate (PCV13) vaccine.** / Consult your health care provider.  Pneumococcal polysaccharide (PPSV23) vaccine.** / 1 to 2 doses if you smoke cigarettes or if you have certain conditions.  Meningococcal vaccine.** / 1 dose if you are age 78 to 67 years and a [employer] researcher living in a residence hall, or have one of several medical conditions. You may also need additional booster doses.  Hepatitis A vaccine.** / Consult your health care provider.  Hepatitis B vaccine.** / Consult your health care provider.  Haemophilus influenzae type b (Hib) vaccine.** / Consult your health care provider. Ages 33 to 42  Blood pressure check.** / Every year.  Lipid and cholesterol check.** / Every 5 years  beginning at age 54.  Lung cancer screening. / Every year if you are aged 31-80 years and have a 30-pack-year history of smoking  and currently smoke or have quit within the past 15 years. Yearly screening is stopped once you have quit smoking for at least 15 years or develop a health problem that would prevent you from having lung cancer treatment.  Fecal occult blood test (FOBT) of stool. / Every year beginning at age 71 and continuing until age 99. You may not have to do this test if you get a colonoscopy every 10 years.  Flexible sigmoidoscopy** or colonoscopy.** / Every 5 years for a flexible sigmoidoscopy or every 10 years for a colonoscopy beginning at age 18 and continuing until age 46.  Hepatitis C blood test.** / For all people born from [birth year] through 1965 and any individual with known risks for hepatitis C.  Skin self-exam. / Monthly.  Influenza vaccine. / Every year.  Tetanus, diphtheria, and acellular pertussis (Tdap/Td) vaccine.** / Consult your health care provider. 1 dose of Td every 10 years.  Varicella vaccine.** / Consult your health care provider.  Zoster vaccine.** / 1 dose for adults aged 65 years or older.  Measles, mumps, rubella (MMR) vaccine.** / You need at least 1 dose of MMR if you were born in 1956 or later. You may also need a second dose.  Pneumococcal 13-valent conjugate (PCV13) vaccine.** / Consult your health care provider.  Pneumococcal polysaccharide (PPSV23) vaccine.** / 1 to 2 doses if you smoke cigarettes or if you have certain conditions.  Meningococcal vaccine.** / Consult your health care provider.  Hepatitis A vaccine.** / Consult your health care provider.  Hepatitis B vaccine.** / Consult your health care provider.  Haemophilus influenzae type b (Hib) vaccine.** / Consult your health care provider. Ages 45 and over  Blood pressure check.** / Every year.  Lipid and cholesterol check.**/ Every 5 years beginning at age 23.  Lung cancer  screening. / Every year if you are aged 45-80 years and have a 30-pack-year history of smoking and currently smoke or have quit within the past 15 years. Yearly screening is stopped once you have quit smoking for at least 15 years or develop a health problem that would prevent you from having lung cancer treatment.  Fecal occult blood test (FOBT) of stool. / Every year beginning at age 68 and continuing until age 35. You may not have to do this test if you get a colonoscopy every 10 years.  Flexible sigmoidoscopy** or colonoscopy.** / Every 5 years for a flexible sigmoidoscopy or every 10 years for a colonoscopy beginning at age 19 and continuing until age 70.  Hepatitis C blood test.** / For all people born from [birth year] through 1964 and any individual with known risks for hepatitis C.  Abdominal aortic aneurysm (AAA) screening.** / A one-time screening for ages 55 to 48 years who are current or former smokers.  Skin self-exam. / Monthly.  Influenza vaccine. / Every year.  Tetanus, diphtheria, and acellular pertussis (Tdap/Td) vaccine.** / 1 dose of Td every 10 years.  Varicella vaccine.** / Consult your health care provider.  Zoster vaccine.** / 1 dose for adults aged 32 years or older.  Pneumococcal 13-valent conjugate (PCV13) vaccine.** / 1 dose for all adults aged 30 years and older.  Pneumococcal polysaccharide (PPSV23) vaccine.** / 1 dose for all adults aged 37 years and older.  Meningococcal vaccine.** / Consult your health care provider.  Hepatitis A vaccine.** / Consult your health care provider.  Hepatitis B vaccine.** / Consult your health care provider.  Haemophilus influenzae type b (Hib) vaccine.** / Consult your health  care provider. **Family history and personal history of risk and conditions may change your health care provider's recommendations.   This information is not intended to replace advice given to you by your health care provider. Make sure you discuss any  questions you have with your health care provider.   Document Released: 02/15/2001 Document Revised: 01/10/2014 Document Reviewed: 05/17/2010 Elsevier Interactive Patient Education [employer].

## 2015-10-26 NOTE — Assessment & Plan Note (Signed)
Depression screen negative. Health Maintenance reviewed -- Will check immunity status for Varicella before giving Shingles. Other immunizations up-to-date. Preventive schedule discussed and handout given in AVS. Will obtain fasting labs today.

## 2015-10-26 NOTE — Progress Notes (Signed)
Patient presents to clinic today for annual exam.  Patient is fasting for labs. Body mass index is 27.1 kg/m. Patient endorses well-balanced diet overall. Is good about exercise usually but has not been able to for the past 3 weeks as he has healed from a sprained ankle.  Acute Concerns: Patient denies acute concerns today.  Health Maintenance: Immunizations -- Tetanus and Pneumonia up-to-date. Had flu shot at work. Would like to discuss shingles vaccine. Patient is unsure of chicken pox. Colonoscopy -- up-to-date  Past Medical History:  Diagnosis Date  . Cerebrovascular disease, unspecified   . Diabetes mellitus   . History of bronchitis   . Hypertension   . Impotence of organic origin   . Knee pain    Right  . Other and unspecified hyperlipidemia    Hypercholesterolemia, Borderline  . Testicular hypofunction     Past Surgical History:  Procedure Laterality Date  . SHOULDER SURGERY Right 2004, 1990  . WISDOM TOOTH EXTRACTION      Current Outpatient Prescriptions on File Prior to Visit  Medication Sig Dispense Refill  . acyclovir (ZOVIRAX) 800 MG tablet Take 800 mg by mouth daily.     [name] aspirin 81 MG tablet Take 81 mg by mouth daily.      [name] atenolol (TENORMIN) 50 MG tablet TAKE 1 TABLET DAILY (REPLACES BYSTOLIC) 15 tablet 0  . bromocriptine (PARLODEL) 2.5 MG tablet Take 1 tablet (2.5 mg total) by mouth at bedtime. 90 tablet 2  . dapagliflozin propanediol (FARXIGA) 10 MG TABS tablet Take 10 mg by mouth daily. 90 tablet 1  . diclofenac (VOLTAREN) 75 MG EC tablet Take 75 mg by mouth 2 (two) times daily.    . hydrochlorothiazide (HYDRODIURIL) 12.5 MG tablet Take 1 tablet (12.5 mg total) by mouth daily. 90 tablet 3  . linagliptin (TRADJENTA) 5 MG TABS tablet Take 1 tablet (5 mg total) by mouth daily. 30 tablet 11  . meloxicam (MOBIC) 7.5 MG tablet TAKE ONE TABLET BY MOUTH EVERY DAY AS NEEDED FOR  ARTHRITIS  PAIN 30 tablet 6  . metFORMIN (GLUCOPHAGE-XR) 500 MG 24 hr tablet  TAKE 2 TABLETS TWICE A DAY 360 tablet 0  . Multiple Vitamin (MULTIVITAMIN) tablet Take 1 tablet by mouth daily.      . repaglinide (PRANDIN) 0.5 MG tablet Take 1 tablet (0.5 mg total) by mouth 2 (two) times daily before a meal. 180 tablet 3  . rosuvastatin (CRESTOR) 5 MG tablet TAKE 1 TABLET DAILY 90 tablet 0  . sildenafil (VIAGRA) 100 MG tablet Take 100 mg by mouth daily as needed. Use as directed     . tamsulosin (FLOMAX) 0.4 MG CAPS capsule Take 1 capsule (0.4 mg total) by mouth daily. 30 capsule 3  . Testosterone (ANDROGEL) 20.25 MG/1.25GM (1.62%) GEL Apply [2] pumps to each to shoulders every morning     No current facility-administered medications on file prior to visit.     Allergies  Allergen Reactions  . Actos [Pioglitazone]     edema  . Azithromycin     REACTION: rash  . Tetracycline     REACTION: rash  . Trandolapril     REACTION: ALLERGIC to ACEs w/ Angioedema  . Other Rash    MAGIC MOUTHWASH: CAUSES RASH    Family History  Problem Relation Age of Onset  . Heart disease Mother 37    Deceased  . Heart attack Mother   . Hypertension Mother   . Diabetes Father 90    Deceased  .  Hypertension Father   . Heart disease Sister     heart trouble  . Diabetes Brother   . Cancer Paternal Aunt     x1-Bone  . Cancer Paternal Aunt     multiple  . Heart attack Maternal Grandmother   . Healthy Sister     x6  . Colon cancer Neg Hx     Social History   Social History  . Marital status: Married    Spouse name: [name]  . Number of children: 0  . Years of education: N/A   Occupational History  .  [employer]   Social History Main Topics  . Smoking status: Never Smoker  . Smokeless tobacco: Never Used  . Alcohol use 0.0 oz/week     Comment: rare beer  . Drug use: No  . Sexual activity: Yes   Other Topics Concern  . Not on file   Social History Narrative   Regular exercise-yes   Caffeine-2 cups   Married=wife [name] 22 years   Cigar Smoker-quit  smoking Jan 2010    Review of Systems  Constitutional: Negative for fever and weight loss.  HENT: Negative for ear discharge, ear pain, hearing loss and tinnitus.   Eyes: Negative for blurred vision, double vision, photophobia and pain.  Respiratory: Negative for cough and shortness of breath.   Cardiovascular: Negative for chest pain and palpitations.  Gastrointestinal: Negative for abdominal pain, blood in stool, constipation, diarrhea, heartburn, melena, nausea and vomiting.  Genitourinary: Negative for dysuria, flank pain, frequency, hematuria and urgency.  Musculoskeletal: Negative for falls.  Neurological: Negative for dizziness, loss of consciousness and headaches.  Endo/Heme/Allergies: Negative for environmental allergies.  Psychiatric/Behavioral: Negative for depression, hallucinations, substance abuse and suicidal ideas. The patient is not nervous/anxious and does not have insomnia.     BP 124/84 (BP Location: Right Arm, Patient Position: Sitting, Cuff Size: Large)   Pulse 68   Temp 97.6 F (36.4 C) (Oral)   Resp 16   Ht 5' 7" (1.702 m)   Wt 173 lb (78.5 kg)   SpO2 98%   BMI 27.10 kg/m   Physical Exam  Constitutional: He is oriented to person, place, and time and well-developed, well-nourished, and in no distress.  HENT:  Head: Normocephalic and atraumatic.  Right Ear: External ear normal.  Left Ear: External ear normal.  Nose: Nose normal.  Mouth/Throat: Oropharynx is clear and moist. No oropharyngeal exudate.  Eyes: Conjunctivae and EOM are normal. Pupils are equal, round, and reactive to light.  Neck: Neck supple. No thyromegaly present.  Cardiovascular: Normal rate, regular rhythm, normal heart sounds and intact distal pulses.   Pulmonary/Chest: Effort normal and breath sounds normal. No respiratory distress. He has no wheezes. He has no rales. He exhibits no tenderness.  Abdominal: Soft. Bowel sounds are normal. He exhibits no distension and no mass. There is no  tenderness. There is no rebound and no guarding.  Genitourinary: Testes/scrotum normal.  Lymphadenopathy:    He has no cervical adenopathy.  Neurological: He is alert and oriented to person, place, and time.  Skin: Skin is warm and dry. No rash noted.  Psychiatric: Affect normal.  Vitals reviewed.  Recent Results (from the past 2160 hour(s))  POCT glycosylated hemoglobin (Hb A1C)     Status: None   Collection Time: 09/21/15  9:29 AM  Result Value Ref Range   Hemoglobin A1C 6.9   Lipid panel     Status: None   Collection Time: 09/22/15  7:49 AM  Result Value Ref Range   Cholesterol 159 0 - 200 mg/dL    Comment: ATP III Classification       Desirable:  < 200 mg/dL               Borderline High:  200 - 239 mg/dL          High:  > = 240 mg/dL   Triglycerides 78.0 0.0 - 149.0 mg/dL    Comment: Normal:  <150 mg/dLBorderline High:  150 - 199 mg/dL   HDL 48.30 >39.00 mg/dL   VLDL 15.6 0.0 - 40.0 mg/dL   LDL Cholesterol 95 0 - 99 mg/dL   Total CHOL/HDL Ratio 3     Comment:                Men          Women1/2 Average Risk     3.4          3.3Average Risk          5.0          4.42X Average Risk          9.6          7.13X Average Risk          15.0          11.0                       NonHDL 111.01     Comment: NOTE:  Non-HDL goal should be 30 mg/dL higher than patient's LDL goal (i.e. LDL goal of < 70 mg/dL, would have non-HDL goal of < 100 mg/dL)  Comp Met (CMET)     Status: Abnormal   Collection Time: 09/22/15  7:49 AM  Result Value Ref Range   Sodium 136 135 - 145 mEq/L   Potassium 4.2 3.5 - 5.1 mEq/L   Chloride 99 96 - 112 mEq/L   CO2 31 19 - 32 mEq/L   Glucose, Bld 157 (H) 70 - 99 mg/dL   BUN 15 6 - 23 mg/dL   Creatinine, Ser 1.00 0.40 - 1.50 mg/dL   Total Bilirubin 0.3 0.2 - 1.2 mg/dL   Alkaline Phosphatase 66 39 - 117 U/L   AST 27 0 - 37 U/L   ALT 33 0 - 53 U/L   Total Protein 7.0 6.0 - 8.3 g/dL   Albumin 4.1 3.5 - 5.2 g/dL   Calcium 9.1 8.4 - 10.5 mg/dL   GFR 96.76 >60.00  mL/min    Assessment/Plan: Visit for preventive health examination Depression screen negative. Health Maintenance reviewed -- Will check immunity status for Varicella before giving Shingles. Other immunizations up-to-date. Preventive schedule discussed and handout given in AVS. Will obtain fasting labs today.   Screening for ischemic heart disease EKG reveals NSR. BP normotensive. Asymptomatic. Continue current regimen.    Leeanne Rio, PA-C

## 2015-10-27 LAB — VARICELLA ZOSTER ANTIBODY, IGG: VARICELLA IGG: 407.4 {index} — AB (ref <135.00)

## 2015-10-27 LAB — HM DIABETES EYE EXAM

## 2015-10-28 ENCOUNTER — Ambulatory Visit (INDEPENDENT_AMBULATORY_CARE_PROVIDER_SITE_OTHER): Payer: BLUE CROSS/BLUE SHIELD

## 2015-10-28 ENCOUNTER — Encounter: Payer: BLUE CROSS/BLUE SHIELD | Admitting: Podiatry

## 2015-10-28 ENCOUNTER — Other Ambulatory Visit: Payer: Self-pay | Admitting: Endocrinology

## 2015-10-28 DIAGNOSIS — M779 Enthesopathy, unspecified: Secondary | ICD-10-CM

## 2015-10-28 NOTE — Progress Notes (Signed)
Orthotics with instructions were dispensed, verbal instruction understood by patient, follow up as needed

## 2015-10-28 NOTE — Patient Instructions (Signed)

## 2015-11-02 DIAGNOSIS — E11319 Type 2 diabetes mellitus with unspecified diabetic retinopathy without macular edema: Secondary | ICD-10-CM | POA: Insufficient documentation

## 2015-11-03 ENCOUNTER — Ambulatory Visit (INDEPENDENT_AMBULATORY_CARE_PROVIDER_SITE_OTHER): Payer: BLUE CROSS/BLUE SHIELD | Admitting: Physician Assistant

## 2015-11-03 DIAGNOSIS — Z23 Encounter for immunization: Secondary | ICD-10-CM | POA: Diagnosis not present

## 2015-11-03 NOTE — Progress Notes (Signed)
Pre visit review using our clinic review tool, if applicable. No additional management support is needed unless otherwise documented below in the visit note/SLS  After obtaining consent, and per orders of W. Elyn Aquas, PA-C, injection of Zostavax 0.34mL was given by [facility]. Patient instructed to not rub or scratch ar injection site, and to report any adverse reaction to office immediately. Patient understood & agreed/SLS 10/31

## 2015-11-09 ENCOUNTER — Other Ambulatory Visit: Payer: Self-pay | Admitting: Physician Assistant

## 2015-11-09 NOTE — Telephone Encounter (Signed)
Rx request to pharmacy/SLS  

## 2015-11-30 ENCOUNTER — Other Ambulatory Visit: Payer: Self-pay | Admitting: Physician Assistant

## 2015-12-03 NOTE — Progress Notes (Signed)
This encounter was created in error - please disregard.

## 2016-01-12 ENCOUNTER — Other Ambulatory Visit: Payer: Self-pay | Admitting: Endocrinology

## 2016-02-15 ENCOUNTER — Ambulatory Visit: Payer: BLUE CROSS/BLUE SHIELD | Admitting: Endocrinology

## 2016-02-19 ENCOUNTER — Other Ambulatory Visit: Payer: Self-pay | Admitting: Physician Assistant

## 2016-03-07 ENCOUNTER — Other Ambulatory Visit: Payer: Self-pay | Admitting: Physician Assistant

## 2016-03-07 LAB — HM DIABETES EYE EXAM

## 2016-03-10 ENCOUNTER — Ambulatory Visit (INDEPENDENT_AMBULATORY_CARE_PROVIDER_SITE_OTHER): Payer: BLUE CROSS/BLUE SHIELD | Admitting: Endocrinology

## 2016-03-10 ENCOUNTER — Encounter: Payer: Self-pay | Admitting: Endocrinology

## 2016-03-10 VITALS — BP 136/84 | HR 58 | Ht 67.0 in | Wt 173.0 lb

## 2016-03-10 DIAGNOSIS — E1159 Type 2 diabetes mellitus with other circulatory complications: Secondary | ICD-10-CM | POA: Diagnosis not present

## 2016-03-10 LAB — MICROALBUMIN / CREATININE URINE RATIO
CREATININE, U: 109.6 mg/dL
Microalb Creat Ratio: 0.6 mg/g (ref 0.0–30.0)
Microalb, Ur: <0.7 mg/dL (ref 0.0–1.9)

## 2016-03-10 LAB — POCT GLYCOSYLATED HEMOGLOBIN (HGB A1C): Hemoglobin A1C: 7

## 2016-03-10 MED ORDER — CLOTRIMAZOLE-BETAMETHASONE 1-0.05 % EX CREA: 1.0000 "application " | TOPICAL_CREAM | Freq: Every day | CUTANEOUS | 3 refills | Status: DC

## 2016-03-10 MED ORDER — REPAGLINIDE 1 MG PO TABS: 1.0000 mg | ORAL_TABLET | Freq: Two times a day (BID) | ORAL | 3 refills | Status: DC

## 2016-03-10 NOTE — Patient Instructions (Addendum)
Please come back for a follow-up appointment in 4-6 months.   I have sent 2 prescriptions to your pharmacy, for a ream for your feet, and to increase the repaglinide. .   check your blood sugar once a day.  vary the time of day when you check, between before the 3 meals, and at bedtime.  also check if you have symptoms of your blood sugar being too high or too low.  please keep a record of the readings and bring it to your next appointment here (or you can bring the meter itself).  You can write it on any piece of paper.  please call us sooner if your blood sugar goes below 70, or if you have a lot of readings over 200.

## 2016-03-10 NOTE — Progress Notes (Signed)
Subjective:    Patient ID: [name], male    DOB: [birth year], 66 y.o.   MRN: [id number]  HPI Pt returns for f/u of diabetes mellitus: DM type: 2 (but due to lean body habitus, he is felt to be possibly developing type 1) Dx'ed: 1779 Complications: cerebrovascular disease Therapy: 5 oral meds DKA: never.  Severe hypoglycemia: never.  Pancreatitis: never. Other: he can't take pioglitizone, due to edema; he has never taken insulin, but he has learned how.  Interval history: he says cbg's are well-controlled. pt states he feels well in general.   Past Medical History:  Diagnosis Date  . Cerebrovascular disease, unspecified   . Diabetes mellitus   . History of bronchitis   . Hypertension   . Impotence of organic origin   . Knee pain    Right  . Other and unspecified hyperlipidemia    Hypercholesterolemia, Borderline  . Testicular hypofunction     Past Surgical History:  Procedure Laterality Date  . SHOULDER SURGERY Right 2004, 1990  . WISDOM TOOTH EXTRACTION      Social History   Social History  . Marital status: Married    Spouse name: [name]  . Number of children: 0  . Years of education: N/A   Occupational History  .  [employer]   Social History Main Topics  . Smoking status: Never Smoker  . Smokeless tobacco: Never Used  . Alcohol use 0.0 oz/week     Comment: rare beer  . Drug use: No  . Sexual activity: Yes   Other Topics Concern  . Not on file   Social History Narrative   Regular exercise-yes   Caffeine-2 cups   Married=wife [name] 22 years   Cigar Smoker-quit smoking Jan 2010    Current Outpatient Prescriptions on File Prior to Visit  Medication Sig Dispense Refill  . acyclovir (ZOVIRAX) 800 MG tablet Take 800 mg by mouth daily.     [name] aspirin 81 MG tablet Take 81 mg by mouth daily.      [name] atenolol (TENORMIN) 50 MG tablet TAKE 1 TABLET DAILY 90 tablet 0  . bromocriptine (PARLODEL) 2.5 MG tablet TAKE 1 TABLET AT BEDTIME 90  tablet 2  . dapagliflozin propanediol (FARXIGA) 10 MG TABS tablet Take 10 mg by mouth daily. 90 tablet 1  . diclofenac (VOLTAREN) 75 MG EC tablet Take 75 mg by mouth 2 (two) times daily.    . hydrochlorothiazide (HYDRODIURIL) 12.5 MG tablet Take 1 tablet (12.5 mg total) by mouth daily. 90 tablet 3  . linagliptin (TRADJENTA) 5 MG TABS tablet Take 1 tablet (5 mg total) by mouth daily. 30 tablet 11  . meloxicam (MOBIC) 7.5 MG tablet TAKE ONE TABLET BY MOUTH EVERY DAY AS NEEDED FOR  ARTHRITIS  PAIN 30 tablet 6  . metFORMIN (GLUCOPHAGE-XR) 500 MG 24 hr tablet TAKE 2 TABLETS TWICE A DAY 360 tablet 0  . Multiple Vitamin (MULTIVITAMIN) tablet Take 1 tablet by mouth daily.      . rosuvastatin (CRESTOR) 5 MG tablet TAKE 1 TABLET DAILY 90 tablet 0  . sildenafil (VIAGRA) 100 MG tablet Take 100 mg by mouth daily as needed. Use as directed     . tamsulosin (FLOMAX) 0.4 MG CAPS capsule Take 1 capsule (0.4 mg total) by mouth daily. 30 capsule 3  . Testosterone (ANDROGEL) 20.25 MG/1.25GM (1.62%) GEL Apply [2] pumps to each to shoulders every morning     No current facility-administered medications on file prior to visit.  Allergies  Allergen Reactions  . Actos [Pioglitazone]     edema  . Azithromycin     REACTION: rash  . Tetracycline     REACTION: rash  . Trandolapril     REACTION: ALLERGIC to ACEs w/ Angioedema  . Other Rash    MAGIC MOUTHWASH: CAUSES RASH    Family History  Problem Relation Age of Onset  . Heart disease Mother 7    Deceased  . Heart attack Mother   . Hypertension Mother   . Diabetes Father 83    Deceased  . Hypertension Father   . Heart disease Sister     heart trouble  . Diabetes Brother   . Cancer Paternal Aunt     x1-Bone  . Cancer Paternal Aunt     multiple  . Heart attack Maternal Grandmother   . Healthy Sister     x6  . Colon cancer Neg Hx     BP 136/84   Pulse (!) 58   Ht 5\' 7"  (1.702 m)   Wt 173 lb (78.5 kg)   SpO2 98%   BMI 27.10 kg/m     Review of Systems He denies hypoglycemia.  He has itching of the feet.      Objective:   Physical Exam VITAL SIGNS:  See vs page GENERAL: no distress Pulses: dorsalis pedis intact bilat.   MSK: no deformity of the feet CV: no leg edema Skin:  no ulcer or rash on the feet.  normal color and temp on the feet. Neuro: sensation is intact to touch on the feet.   Lab Results  Component Value Date   HGBA1C 7.0 03/10/2016      Assessment & Plan:  Foot itching, new, possibly due to tinea pedis. Type 2 DM, with PAD: he needs increased rx.  Patient is advised the following: Patient Instructions  Please come back for a follow-up appointment in 4-6 months.   I have sent 2 prescriptions to your pharmacy, for a ream for your feet, and to increase the repaglinide. .   check your blood sugar once a day.  vary the time of day when you check, between before the 3 meals, and at bedtime.  also check if you have symptoms of your blood sugar being too high or too low.  please keep a record of the readings and bring it to your next appointment here (or you can bring the meter itself).  You can write it on any piece of paper.  please call us sooner if your blood sugar goes below 70, or if you have a lot of readings over 200.

## 2016-04-15 ENCOUNTER — Other Ambulatory Visit: Payer: Self-pay | Admitting: Endocrinology

## 2016-04-25 ENCOUNTER — Ambulatory Visit (INDEPENDENT_AMBULATORY_CARE_PROVIDER_SITE_OTHER): Payer: BLUE CROSS/BLUE SHIELD | Admitting: Family Medicine

## 2016-04-25 ENCOUNTER — Encounter: Payer: Self-pay | Admitting: Family Medicine

## 2016-04-25 VITALS — BP 110/76 | HR 61 | Temp 98.1°F | Ht 66.0 in | Wt 177.0 lb

## 2016-04-25 DIAGNOSIS — M2142 Flat foot [pes planus] (acquired), left foot: Secondary | ICD-10-CM

## 2016-04-25 DIAGNOSIS — M2141 Flat foot [pes planus] (acquired), right foot: Secondary | ICD-10-CM

## 2016-04-25 NOTE — Patient Instructions (Addendum)
Call your urologist about having your wife doing your injections. Sometimes they will be able to call in needles and syringes.   Call your podiatrist about your feet.

## 2016-04-25 NOTE — Progress Notes (Signed)
Pre visit review using our clinic review tool, if applicable. No additional management support is needed unless otherwise documented below in the visit note. 

## 2016-04-25 NOTE — Progress Notes (Signed)
Chief Complaint  Patient presents with  . Establish Care    Subjective: Patient is a 66 y.o. male here for transfer of care. He would like to discuss pain/swelling in both of his feet.  It started around 1 mo ago after he changed his custom orthotic type. He thought it would resolve after they broke in, but it did not. The pain is located on the inside of both of his feet. He does have known flat feet for which he follows podiatry for. There is no injury, change in activity, or change in footwear other than is customary orthotics. He denies any numbness, tingling, or weakness. He feels that it is affecting his knees.   ROS: MSK: As noted in HPI  Family History  Problem Relation Age of Onset  . Heart disease Mother 82    Deceased  . Heart attack Mother   . Hypertension Mother   . Diabetes Father 42    Deceased  . Hypertension Father   . Heart disease Sister     heart trouble  . Diabetes Brother   . Cancer Paternal Aunt     x1-Bone  . Cancer Paternal Aunt     multiple  . Heart attack Maternal Grandmother   . Healthy Sister     x6  . Colon cancer Neg Hx    Past Medical History:  Diagnosis Date  . Cerebrovascular disease, unspecified   . Diabetes mellitus   . History of bronchitis   . Hypertension   . Impotence of organic origin   . Knee pain    Right  . Other and unspecified hyperlipidemia    Hypercholesterolemia, Borderline  . Testicular hypofunction    Allergies  Allergen Reactions  . Actos [Pioglitazone]     edema  . Azithromycin     REACTION: rash  . Tetracycline     REACTION: rash  . Trandolapril     REACTION: ALLERGIC to ACEs w/ Angioedema  . Other Rash    MAGIC MOUTHWASH: CAUSES RASH    Current Outpatient Prescriptions:  .  acyclovir (ZOVIRAX) 800 MG tablet, Take 800 mg by mouth daily. , Disp: , Rfl:  .  aspirin 81 MG tablet, Take 81 mg by mouth daily.  , Disp: , Rfl:  .  atenolol (TENORMIN) 50 MG tablet, TAKE 1 TABLET DAILY, Disp: 90 tablet, Rfl:  0 .  bromocriptine (PARLODEL) 2.5 MG tablet, TAKE 1 TABLET AT BEDTIME, Disp: 90 tablet, Rfl: 2 .  clotrimazole-betamethasone (LOTRISONE) cream, Apply 1 application topically at bedtime. To feet, Disp: 45 g, Rfl: 3 .  dapagliflozin propanediol (FARXIGA) 10 MG TABS tablet, Take 10 mg by mouth daily., Disp: 90 tablet, Rfl: 1 .  diclofenac (VOLTAREN) 75 MG EC tablet, Take 75 mg by mouth 2 (two) times daily., Disp: , Rfl:  .  hydrochlorothiazide (HYDRODIURIL) 12.5 MG tablet, Take 1 tablet (12.5 mg total) by mouth daily., Disp: 90 tablet, Rfl: 3 .  linagliptin (TRADJENTA) 5 MG TABS tablet, Take 1 tablet (5 mg total) by mouth daily., Disp: 30 tablet, Rfl: 11 .  meloxicam (MOBIC) 7.5 MG tablet, TAKE ONE TABLET BY MOUTH EVERY DAY AS NEEDED FOR  ARTHRITIS  PAIN, Disp: 30 tablet, Rfl: 6 .  metFORMIN (GLUCOPHAGE-XR) 500 MG 24 hr tablet, TAKE 2 TABLETS TWICE A DAY, Disp: 360 tablet, Rfl: 0 .  Multiple Vitamin (MULTIVITAMIN) tablet, Take 1 tablet by mouth daily.  , Disp: , Rfl:  .  repaglinide (PRANDIN) 1 MG tablet, Take 1 tablet (1  mg total) by mouth 2 (two) times daily before a meal., Disp: 180 tablet, Rfl: 3 .  rosuvastatin (CRESTOR) 5 MG tablet, TAKE 1 TABLET DAILY, Disp: 90 tablet, Rfl: 0 .  sildenafil (VIAGRA) 100 MG tablet, Take 100 mg by mouth daily as needed. Use as directed , Disp: , Rfl:  .  tamsulosin (FLOMAX) 0.4 MG CAPS capsule, Take 1 capsule (0.4 mg total) by mouth daily., Disp: 30 capsule, Rfl: 3  Objective: BP 110/76 (BP Location: Left Arm, Patient Position: Sitting, Cuff Size: Normal)   Pulse 61   Temp 98.1 F (36.7 C) (Oral)   Ht 5\' 6"  (1.676 m) Comment: 5.6.5  Wt 177 lb (80.3 kg)   SpO2 96%   BMI 28.57 kg/m  General: Awake, appears stated age Heart: RRR, 2+ DP pulse b/l, trace pitting edema up to prox 1/3 of tibia. Lungs: No accessory muscle use MSK: Mild edema medial portion of feet b/l, +TTP over TP b/l, no calf TTP, flat feet b/l Psych: Age appropriate judgment and insight,  normal affect and mood  Assessment and Plan: Pes planus of both feet  Let podiatrist know about change since changing orthotics.  Discussed contacting his urologist for calling in needles/syringes so his wife can administer his testosterone injections. I will see him prn or in 6 mo for his CPE.  The patient voiced understanding and agreement to the plan.  [place], DO 04/25/16  3:08 PM

## 2016-04-27 ENCOUNTER — Other Ambulatory Visit: Payer: Self-pay | Admitting: Physician Assistant

## 2016-04-28 ENCOUNTER — Ambulatory Visit (INDEPENDENT_AMBULATORY_CARE_PROVIDER_SITE_OTHER): Payer: BLUE CROSS/BLUE SHIELD | Admitting: Podiatry

## 2016-04-28 DIAGNOSIS — M76822 Posterior tibial tendinitis, left leg: Secondary | ICD-10-CM

## 2016-04-28 DIAGNOSIS — M779 Enthesopathy, unspecified: Secondary | ICD-10-CM

## 2016-04-28 MED ORDER — TRIAMCINOLONE ACETONIDE 10 MG/ML IJ SUSP
10.0000 mg | Freq: Once | INTRAMUSCULAR | Status: AC
  Administered 2016-04-28: 10 mg

## 2016-04-30 NOTE — Progress Notes (Signed)
Subjective:    Patient ID: [name], male   DOB: 66 y.o.   MRN: [id number]   HPI patient presents with a lot of pain in the ankle right over left stating it's been getting worse for the last week    ROS      Objective:  Physical Exam Neurovascular status unchanged with patient noted to have quite a bit of discomfort in the left posterior tibial tendon and right in the ankle joint with pain with pressure. 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

## 2016-05-10 ENCOUNTER — Other Ambulatory Visit: Payer: Self-pay | Admitting: Endocrinology

## 2016-05-12 ENCOUNTER — Ambulatory Visit: Payer: BLUE CROSS/BLUE SHIELD | Admitting: Podiatry

## 2016-05-16 ENCOUNTER — Other Ambulatory Visit: Payer: Self-pay | Admitting: Physician Assistant

## 2016-06-01 ENCOUNTER — Ambulatory Visit (INDEPENDENT_AMBULATORY_CARE_PROVIDER_SITE_OTHER): Payer: BLUE CROSS/BLUE SHIELD | Admitting: Podiatry

## 2016-06-01 DIAGNOSIS — M76822 Posterior tibial tendinitis, left leg: Secondary | ICD-10-CM

## 2016-06-01 NOTE — Progress Notes (Signed)
Subjective:    Patient ID: [name], male   DOB: 66 y.o.   MRN: [id number]   HPI patient states that his pain is improved but is having trouble tolerating his orthotics    ROS      Objective:  Physical Exam Neurovascular status intact with posterior tibial discomfort which is improved left over right with moderate depression of the arch localized in nature    Assessment:   Tendinitis of the posterior tib left over right that's improved with orthotics that are still being broken in      Plan:   I explained that we may need to modify his orthotics but at this point we will give it 4-6 more weeks of usage and if symptoms persist or problems persist he is to reappoint to the pedicle orthotist for consideration of change in orthotics

## 2016-07-11 ENCOUNTER — Other Ambulatory Visit: Payer: Self-pay

## 2016-07-11 MED ORDER — GLUCOSE BLOOD VI STRP: ORAL_STRIP | 2 refills | Status: DC

## 2016-07-12 ENCOUNTER — Ambulatory Visit: Payer: BLUE CROSS/BLUE SHIELD | Admitting: Endocrinology

## 2016-07-20 ENCOUNTER — Other Ambulatory Visit: Payer: Self-pay | Admitting: Endocrinology

## 2016-08-12 ENCOUNTER — Other Ambulatory Visit: Payer: Self-pay | Admitting: Family Medicine

## 2016-08-12 NOTE — Telephone Encounter (Signed)
Rx approved and sent to the pharmacy by e-script.//AB/CMA 

## 2016-08-19 ENCOUNTER — Ambulatory Visit (INDEPENDENT_AMBULATORY_CARE_PROVIDER_SITE_OTHER): Payer: BLUE CROSS/BLUE SHIELD | Admitting: Endocrinology

## 2016-08-19 ENCOUNTER — Encounter: Payer: Self-pay | Admitting: Endocrinology

## 2016-08-19 VITALS — BP 142/90 | HR 68 | Wt 169.2 lb

## 2016-08-19 DIAGNOSIS — E1159 Type 2 diabetes mellitus with other circulatory complications: Secondary | ICD-10-CM

## 2016-08-19 LAB — BASIC METABOLIC PANEL
BUN: 17 mg/dL (ref 6–23)
CO2: 24 mEq/L (ref 19–32)
Calcium: 9.4 mg/dL (ref 8.4–10.5)
Chloride: 102 mEq/L (ref 96–112)
Creatinine, Ser: 1.11 mg/dL (ref 0.40–1.50)
GFR: 85.53 mL/min (ref >60.00)
GLUCOSE: 134 mg/dL — AB (ref 70–99)
POTASSIUM: 4.2 meq/L (ref 3.5–5.1)
SODIUM: 137 meq/L (ref 135–145)

## 2016-08-19 LAB — HEMOGLOBIN A1C: HEMOGLOBIN A1C: 7.9 % — AB (ref 4.6–6.5)

## 2016-08-19 MED ORDER — REPAGLINIDE 2 MG PO TABS: 2.0000 mg | ORAL_TABLET | Freq: Two times a day (BID) | ORAL | 3 refills | Status: DC

## 2016-08-19 NOTE — Progress Notes (Signed)
Subjective:    Patient ID: [name], male    DOB: 1950/03/08, 66 y.o.   MRN: [id number]  HPI Pt returns for f/u of diabetes mellitus: DM type: 2 (but due to lean body habitus, he is felt to be possibly developing type 1) Dx'ed: 4431 Complications: cerebrovascular disease Therapy: 5 oral meds DKA: never.  Severe hypoglycemia: never.  Pancreatitis: never. Other: he can't take pioglitizone, due to edema; he has never taken insulin, but he has learned how.  Interval history: he says cbg's are well-controlled. pt states he feels well in general.  He has intermittent lightheadedness, but does not check CBG then.   Past Medical History:  Diagnosis Date  . Cerebrovascular disease, unspecified   . Diabetes mellitus   . History of bronchitis   . Hypertension   . Impotence of organic origin   . Knee pain    Right  . Other and unspecified hyperlipidemia    Hypercholesterolemia, Borderline  . Testicular hypofunction     Past Surgical History:  Procedure Laterality Date  . SHOULDER SURGERY Right 2004, 1990  . WISDOM TOOTH EXTRACTION      Social History   Social History  . Marital status: Married    Spouse name: [name]  . Number of children: 0  . Years of education: N/A   Occupational History  .  [employer]   Social History Main Topics  . Smoking status: Never Smoker  . Smokeless tobacco: Never Used  . Alcohol use 0.0 oz/week     Comment: rare beer  . Drug use: No  . Sexual activity: Yes   Other Topics Concern  . Not on file   Social History Narrative   Regular exercise-yes   Caffeine-2 cups   Married=wife [name] 22 years   Cigar Smoker-quit smoking Jan 2010    Current Outpatient Prescriptions on File Prior to Visit  Medication Sig Dispense Refill  . acyclovir (ZOVIRAX) 800 MG tablet Take 800 mg by mouth daily.     [name] aspirin 81 MG tablet Take 81 mg by mouth daily.      [name] atenolol (TENORMIN) 50 MG tablet TAKE 1 TABLET DAILY 90 tablet 3  .  bromocriptine (PARLODEL) 2.5 MG tablet TAKE 1 TABLET AT BEDTIME 90 tablet 2  . clotrimazole-betamethasone (LOTRISONE) cream Apply 1 application topically at bedtime. To feet 45 g 3  . diclofenac (VOLTAREN) 75 MG EC tablet Take 75 mg by mouth 2 (two) times daily.    [name] FARXIGA 10 MG TABS tablet TAKE 1 TABLET DAILY 90 tablet 1  . glucose blood (ONETOUCH VERIO) test strip Use to check blood sugar 1 time per day. 100 each 2  . hydrochlorothiazide (HYDRODIURIL) 12.5 MG tablet Take 1 tablet (12.5 mg total) by mouth daily. 90 tablet 3  . linagliptin (TRADJENTA) 5 MG TABS tablet Take 1 tablet (5 mg total) by mouth daily. (Patient not taking: Reported on 04/28/2016) 30 tablet 11  . meloxicam (MOBIC) 7.5 MG tablet TAKE ONE TABLET BY MOUTH EVERY DAY AS NEEDED FOR  ARTHRITIS  PAIN (Patient not taking: Reported on 04/28/2016) 30 tablet 6  . metFORMIN (GLUCOPHAGE-XR) 500 MG 24 hr tablet TAKE 2 TABLETS TWICE A DAY 360 tablet 0  . Multiple Vitamin (MULTIVITAMIN) tablet Take 1 tablet by mouth daily.      . rosuvastatin (CRESTOR) 5 MG tablet TAKE 1 TABLET DAILY 90 tablet 0  . sildenafil (VIAGRA) 100 MG tablet Take 100 mg by mouth daily as needed. Use as  directed     . tamsulosin (FLOMAX) 0.4 MG CAPS capsule Take 1 capsule (0.4 mg total) by mouth daily. 30 capsule 3   No current facility-administered medications on file prior to visit.     Allergies  Allergen Reactions  . Actos [Pioglitazone]     edema  . Azithromycin     REACTION: rash  . Tetracycline     REACTION: rash  . Trandolapril     REACTION: ALLERGIC to ACEs w/ Angioedema  . Other Rash    MAGIC MOUTHWASH: CAUSES RASH    Family History  Problem Relation Age of Onset  . Heart disease Mother 42       Deceased  . Heart attack Mother   . Hypertension Mother   . Diabetes Father 60       Deceased  . Hypertension Father   . Heart disease Sister        heart trouble  . Diabetes Brother   . Cancer Paternal Aunt        x1-Bone  . Cancer Paternal  Aunt        multiple  . Heart attack Maternal Grandmother   . Healthy Sister        x6  . Colon cancer Neg Hx     BP (!) 142/90   Pulse 68   Wt 169 lb 3.2 oz (76.7 kg)   SpO2 96%   BMI 27.31 kg/m    Review of Systems Denies LOC.     Objective:   Physical Exam VITAL SIGNS:  See vs page GENERAL: no distress Pulses: foot pulses are intact bilaterally.   MSK: no deformity of the feet or ankles.  CV: no edema of the legs or ankles Skin:  no ulcer on the feet or ankles.  normal color and temp on the feet and ankles Neuro: sensation is intact to touch on the feet and ankles.    Lab Results  Component Value Date   HGBA1C 7.9 (H) 08/19/2016       Assessment & Plan:  Type 2 DM, with PAD, worse.  I have sent a prescription to your pharmacy, to double the repaglinide. Lightheadedness: I advised pt to check cbg if this happens again.    Patient Instructions  Please come back for a follow-up appointment in 6 months.  blood tests are requested for you today.  We'll let you know about the results. check your blood sugar once a day.  vary the time of day when you check, between before the 3 meals, and at bedtime.  also check if you have symptoms of your blood sugar being too high or too low.  please keep a record of the readings and bring it to your next appointment here (or you can bring the meter itself).  You can write it on any piece of paper.  please call us sooner if your blood sugar goes below 70, or if you have a lot of readings over 200.

## 2016-08-19 NOTE — Patient Instructions (Addendum)
Please come back for a follow-up appointment in 6 months.  blood tests are requested for you today.  We'll let you know about the results. check your blood sugar once a day.  vary the time of day when you check, between before the 3 meals, and at bedtime.  also check if you have symptoms of your blood sugar being too high or too low.  please keep a record of the readings and bring it to your next appointment here (or you can bring the meter itself).  You can write it on any piece of paper.  please call us sooner if your blood sugar goes below 70, or if you have a lot of readings over 200.

## 2016-08-22 ENCOUNTER — Telehealth: Payer: Self-pay | Admitting: Endocrinology

## 2016-08-22 NOTE — Telephone Encounter (Signed)
See below . Thanks

## 2016-08-22 NOTE — Telephone Encounter (Signed)
Patient calling b/c no script at his pharmacy. His pharm is [facility].  Ty,  -LL

## 2016-08-22 NOTE — Telephone Encounter (Signed)
Called patient & gave him results of labs.

## 2016-08-22 NOTE — Telephone Encounter (Signed)
Please call patient with lab results at [phone], okay to leave a detailed message.

## 2016-08-23 ENCOUNTER — Other Ambulatory Visit: Payer: Self-pay

## 2016-08-23 MED ORDER — REPAGLINIDE 2 MG PO TABS: 2.0000 mg | ORAL_TABLET | Freq: Two times a day (BID) | ORAL | 3 refills | Status: DC

## 2016-08-23 NOTE — Telephone Encounter (Signed)
Submitted to pharmacy provided.

## 2016-08-23 NOTE — Telephone Encounter (Signed)
MEDICATION: repaglinide (PRANDIN) 2 MG tablet  PHARMACY:  [facility] [place], [place] [phone] (Phone) [phone] (Fax)   IS THIS A 90 DAY SUPPLY : this is a new medication, whatever Dr. Loanne Drilling wanted  IS PATIENT OUT OF MEDICTAION: yes, never recieved  IF NOT; HOW MUCH IS LEFT: n/a  LAST APPOINTMENT DATE:08/19/16  NEXT APPOINTMENT DATE:11/21/16  OTHER COMMENTS:Pateint's medication was sent to the incorrect pharmacy. Patient needs medication today, please send to the pharmacy above.     **Let patient know to contact pharmacy at the end of the day to make sure medication is ready. **  ** Please notify patient to allow 48-72 hours to process**  **Encourage patient to contact the pharmacy for refills or they can request refills through [facility]**

## 2016-09-21 ENCOUNTER — Other Ambulatory Visit: Payer: Self-pay | Admitting: Endocrinology

## 2016-10-07 ENCOUNTER — Telehealth: Payer: Self-pay | Admitting: *Deleted

## 2016-10-07 ENCOUNTER — Telehealth: Payer: Self-pay | Admitting: Podiatry

## 2016-10-07 ENCOUNTER — Encounter: Payer: Self-pay | Admitting: Podiatry

## 2016-10-07 NOTE — Telephone Encounter (Signed)
Entered in error

## 2016-10-07 NOTE — Telephone Encounter (Signed)
I was calling to see if I can get a note from Dr. Paulla Dolly stating that I need to wear comfort shoes with my orthotics to work daily. We talked about this the last time I was in and I forgot to get a note. If you need to speak with me, you can reach me at [phone]. Thank you.

## 2016-10-07 NOTE — Telephone Encounter (Signed)
I called pt and told him that we had not seen him since May, and Dr. Paulla Dolly had wanted to see him again, and I the chart notes he was to be seen again in 3-4 weeks. Pt stated he must have forgotten and I told him Dr. Paulla Dolly did not forget and he would want to evaluate again before just giving a note. Pt states he'll just find another doctor, and hung up.

## 2016-10-10 ENCOUNTER — Encounter: Payer: Self-pay | Admitting: *Deleted

## 2016-10-10 NOTE — Telephone Encounter (Addendum)
I spoke with pt, I apologized for not understanding what he needed for his work note. I told pt I would fax the work note stating he could wear comfortable shoes that accommodated his orthotics. Faxed note to pt.

## 2016-10-25 ENCOUNTER — Other Ambulatory Visit: Payer: Self-pay | Admitting: Endocrinology

## 2016-10-27 ENCOUNTER — Encounter: Payer: Self-pay | Admitting: Family Medicine

## 2016-10-27 ENCOUNTER — Ambulatory Visit (INDEPENDENT_AMBULATORY_CARE_PROVIDER_SITE_OTHER): Payer: BLUE CROSS/BLUE SHIELD | Admitting: Family Medicine

## 2016-10-27 VITALS — BP 129/82 | HR 61 | Temp 98.1°F | Ht 66.5 in | Wt 175.0 lb

## 2016-10-27 DIAGNOSIS — Z0000 Encounter for general adult medical examination without abnormal findings: Secondary | ICD-10-CM | POA: Diagnosis not present

## 2016-10-27 DIAGNOSIS — E1159 Type 2 diabetes mellitus with other circulatory complications: Secondary | ICD-10-CM

## 2016-10-27 DIAGNOSIS — Z136 Encounter for screening for cardiovascular disorders: Secondary | ICD-10-CM

## 2016-10-27 DIAGNOSIS — Z1159 Encounter for screening for other viral diseases: Secondary | ICD-10-CM

## 2016-10-27 DIAGNOSIS — Z23 Encounter for immunization: Secondary | ICD-10-CM | POA: Diagnosis not present

## 2016-10-27 LAB — COMPREHENSIVE METABOLIC PANEL
ALBUMIN: 4.1 g/dL (ref 3.5–5.2)
ALK PHOS: 65 U/L (ref 39–117)
ALT: 21 U/L (ref 0–53)
AST: 24 U/L (ref 0–37)
BILIRUBIN TOTAL: 0.5 mg/dL (ref 0.2–1.2)
BUN: 18 mg/dL (ref 6–23)
CALCIUM: 9.6 mg/dL (ref 8.4–10.5)
CO2: 29 mEq/L (ref 19–32)
CREATININE: 0.98 mg/dL (ref 0.40–1.50)
Chloride: 101 mEq/L (ref 96–112)
GFR: 98.7 mL/min (ref >60.00)
Glucose, Bld: 118 mg/dL — ABNORMAL HIGH (ref 70–99)
Potassium: 3.8 mEq/L (ref 3.5–5.1)
Sodium: 138 mEq/L (ref 135–145)
Total Protein: 6.9 g/dL (ref 6.0–8.3)

## 2016-10-27 LAB — LIPID PANEL
CHOL/HDL RATIO: 3
CHOLESTEROL: 142 mg/dL (ref 0–200)
HDL: 52.4 mg/dL (ref >39.00)
LDL Cholesterol: 78 mg/dL (ref 0–99)
NonHDL: 89.4
TRIGLYCERIDES: 55 mg/dL (ref 0.0–149.0)
VLDL: 11 mg/dL (ref 0.0–40.0)

## 2016-10-27 LAB — CBC
HCT: 43.6 % (ref 39.0–52.0)
HEMOGLOBIN: 13.7 g/dL (ref 13.0–17.0)
MCHC: 31.4 g/dL (ref 30.0–36.0)
MCV: 73.9 fl — ABNORMAL LOW (ref 78.0–100.0)
PLATELETS: 259 10*3/uL (ref 150.0–400.0)
RBC: 5.91 Mil/uL — AB (ref 4.22–5.81)
RDW: 23.2 % — ABNORMAL HIGH (ref 11.5–15.5)
WBC: 5.9 10*3/uL (ref 4.0–10.5)

## 2016-10-27 MED ORDER — ROSUVASTATIN CALCIUM 10 MG PO TABS: 10.0000 mg | ORAL_TABLET | Freq: Every day | ORAL | 0 refills | Status: DC

## 2016-10-27 NOTE — Progress Notes (Signed)
Pre visit review using our clinic review tool, if applicable. No additional management support is needed unless otherwise documented below in the visit note. 

## 2016-10-27 NOTE — Progress Notes (Signed)
Chief Complaint  Patient presents with  . Annual Exam    Well Male [name] is here for a complete physical.   His last physical was >1 year ago.  Current diet: in general, a "healthy" diet, could be better Current exercise: physically active at work; walks around a trail; rowing machine Weight trend: stable Does pt snore? No. Daytime fatigue? No. Seat belt? Yes.    Health maintenance Colonoscopy- Yes Tetanus- Yes Hep C- No Pneumonia vaccine- Yes  Past Medical History:  Diagnosis Date  . Diabetes mellitus   . Hypertension   . Impotence of organic origin   . Knee pain    Right  . Other and unspecified hyperlipidemia    Hypercholesterolemia, Borderline  . Testicular hypofunction     Past Surgical History:  Procedure Laterality Date  . SHOULDER SURGERY Right 2004, 1990  . WISDOM TOOTH EXTRACTION     Medications  Current Outpatient Prescriptions on File Prior to Visit  Medication Sig Dispense Refill  . acyclovir (ZOVIRAX) 800 MG tablet Take 800 mg by mouth daily.     [name] aspirin 81 MG tablet Take 81 mg by mouth daily.      [name] atenolol (TENORMIN) 50 MG tablet TAKE 1 TABLET DAILY 90 tablet 3  . bromocriptine (PARLODEL) 2.5 MG tablet TAKE 1 TABLET AT BEDTIME 90 tablet 2  . clotrimazole-betamethasone (LOTRISONE) cream Apply 1 application topically at bedtime. To feet 45 g 3  . diclofenac (VOLTAREN) 75 MG EC tablet Take 75 mg by mouth 2 (two) times daily.    [name] FARXIGA 10 MG TABS tablet TAKE 1 TABLET DAILY 90 tablet 1  . glucose blood (ONETOUCH VERIO) test strip Use to check blood sugar 1 time per day. 100 each 2  . linagliptin (TRADJENTA) 5 MG TABS tablet Take 1 tablet (5 mg total) by mouth daily. 30 tablet 11  . metFORMIN (GLUCOPHAGE-XR) 500 MG 24 hr tablet TAKE 2 TABLETS TWICE A DAY 360 tablet 0  . Multiple Vitamin (MULTIVITAMIN) tablet Take 1 tablet by mouth daily.      . repaglinide (PRANDIN) 2 MG tablet Take 1 tablet (2 mg total) by mouth 2 (two) times daily before a  meal. 180 tablet 3  . rosuvastatin (CRESTOR) 5 MG tablet TAKE 1 TABLET DAILY 90 tablet 0  . hydrochlorothiazide (HYDRODIURIL) 12.5 MG tablet Take 1 tablet (12.5 mg total) by mouth daily. (Patient not taking: Reported on 10/27/2016) 90 tablet 3  . meloxicam (MOBIC) 7.5 MG tablet TAKE ONE TABLET BY MOUTH EVERY DAY AS NEEDED FOR  ARTHRITIS  PAIN (Patient not taking: Reported on 10/27/2016) 30 tablet 6  . sildenafil (VIAGRA) 100 MG tablet Take 100 mg by mouth daily as needed. Use as directed     . tamsulosin (FLOMAX) 0.4 MG CAPS capsule Take 1 capsule (0.4 mg total) by mouth daily. (Patient not taking: Reported on 10/27/2016) 30 capsule 3   Allergies Allergies  Allergen Reactions  . Actos [Pioglitazone]     edema  . Azithromycin     REACTION: rash  . Tetracycline     REACTION: rash  . Trandolapril     REACTION: ALLERGIC to ACEs w/ Angioedema  . Other Rash    MAGIC MOUTHWASH: CAUSES RASH   Family History Family History  Problem Relation Age of Onset  . Heart disease Mother 62       Deceased  . Heart attack Mother   . Hypertension Mother   . Diabetes Father 38  Deceased  . Hypertension Father   . Heart disease Sister        heart trouble  . Diabetes Brother   . Cancer Paternal Aunt        x1-Bone  . Cancer Paternal Aunt        multiple  . Heart attack Maternal Grandmother   . Healthy Sister        x6  . Colon cancer Neg Hx     Review of Systems: Constitutional:  no unexpected change in weight, no fevers or chills Eye:  no recent significant change in vision Ear/Nose/Mouth/Throat:  Ears:  no tinnitus or hearing loss Nose/Mouth/Throat:  no complaints of nasal congestion or bleeding, no sore throat and oral sores Cardiovascular:  no chest pain, no palpitations Respiratory:  no cough and no shortness of breath Gastrointestinal:  no abdominal pain, no change in bowel habits, no nausea, vomiting, diarrhea, or constipation and no black or bloody stool GU:  Male: negative  for dysuria, frequency, and incontinence and negative for prostate symptoms Musculoskeletal/Extremities:  no pain, redness, or swelling of the joints Integumentary (Skin):  no abnormal skin lesions reported Neurologic:  no headaches, no numbness, tingling Endocrine:  No weight changes, masses in the neck, heat/cold intolerance, bowel or skin changes, or cardiovascular system symptoms Hematologic/Lymphatic:  no abnormal bleeding, no night sweats, no weight loss  Exam BP 129/82 (BP Location: Left Arm, Patient Position: Sitting, Cuff Size: Normal)   Pulse 61   Temp 98.1 F (36.7 C) (Oral)   Ht 5' 6.5" (1.689 m)   Wt 175 lb (79.4 kg)   SpO2 98%   BMI 27.82 kg/m  General:  well developed, well nourished, in no apparent distress Skin:  no significant moles, warts, or growths Head:  no masses, lesions, or tenderness Eyes:  pupils equal and round, sclera anicteric without injection Ears:  canals without lesions, TMs shiny without retraction, no obvious effusion, no erythema Nose:  nares patent, septum midline, mucosa normal Throat/Pharynx:  lips and gingiva without lesion; tongue and uvula midline; non-inflamed pharynx; no exudates or postnasal drainage Neck: neck supple without adenopathy, thyromegaly, or masses Lungs:  clear to auscultation, breath sounds equal bilaterally, no respiratory distress Cardio:  regular rate and rhythm without murmurs, heart sounds without clicks or rubs Abdomen:  abdomen soft, nontender; bowel sounds normal; no masses or organomegaly Genital (male): circumcised penis, no lesions or discharge; testes present bilaterally without masses or tenderness Rectal: Deferred Musculoskeletal:  symmetrical muscle groups noted without atrophy or deformity Extremities:  no clubbing, cyanosis, or edema, no deformities, no skin discoloration Neuro:  gait normal; deep tendon reflexes normal and symmetric Psych: well oriented with normal range of affect and appropriate  judgment/insight  Assessment and Plan  Well adult exam - Plan: CBC, Comprehensive metabolic panel, Lipid panel  Type 2 diabetes mellitus with other circulatory complication, without long-term current use of insulin (HCC) - Plan: rosuvastatin (CRESTOR) 10 MG tablet  Encounter for hepatitis C screening test for low risk patient - Plan: Hepatitis C antibody  Screening for AAA (abdominal aortic aneurysm) - Plan: US ABDOMINAL AORTA SCREENING AAA  Need for influenza vaccination - Plan: Flu vaccine HIGH DOSE PF (Fluzone High dose)  Need for vaccination against Streptococcus pneumoniae - Plan: Pneumococcal conjugate vaccine 13-valent   Well 66 y.o. male.  Counseled on diet and exercise. Pt sees urologist who monitors his PSA's.  Pt believes he has smoked >100 cigarettes in his lifetime. Will screen with abd [place]. Will try  to maximize his cardiac prevention with increasing dose of statin. Will start w 10 mg to see if he tolerates, will increase to 20 if doing well after 4-6 weeks. He will call or send MyChart message.  Other orders as above. Follow up in 1 yr or prn, can do EKG for HTN at that visit.  The patient voiced understanding and agreement to the plan.  [place], DO 10/27/16 8:36 AM

## 2016-10-27 NOTE — Patient Instructions (Addendum)
Give [place] 2-3 business days to get the results of your labs back. If labs are normal, you will likely receive a letter in the mail unless you have MyChart. This can take longer than 2-3 business days.   Send me a MyChart message in 4-6 weeks if you are tolerating your new dose of Crestor is not causing any issues. We will move up to 20 mg daily if so.   Let us know if you need anything.

## 2016-10-28 ENCOUNTER — Other Ambulatory Visit (INDEPENDENT_AMBULATORY_CARE_PROVIDER_SITE_OTHER): Payer: BLUE CROSS/BLUE SHIELD

## 2016-10-28 DIAGNOSIS — R718 Other abnormality of red blood cells: Secondary | ICD-10-CM | POA: Diagnosis not present

## 2016-10-28 LAB — HEPATITIS C ANTIBODY
HEP C AB: NONREACTIVE
SIGNAL TO CUT-OFF: 0.03 (ref <1.00)

## 2016-10-28 LAB — IBC PANEL
Iron: 39 ug/dL — ABNORMAL LOW (ref 42–165)
Saturation Ratios: 8 % — ABNORMAL LOW (ref 20.0–50.0)
Transferrin: 349 mg/dL (ref 212.0–360.0)

## 2016-10-28 LAB — FERRITIN: Ferritin: 8.4 ng/mL — ABNORMAL LOW (ref 22.0–322.0)

## 2016-10-29 ENCOUNTER — Other Ambulatory Visit: Payer: Self-pay | Admitting: Family Medicine

## 2016-10-29 DIAGNOSIS — D509 Iron deficiency anemia, unspecified: Secondary | ICD-10-CM

## 2016-10-29 NOTE — Progress Notes (Signed)
Referral to Dr. Hilarie Fredrickson placed. Pt notified.

## 2016-10-30 ENCOUNTER — Ambulatory Visit (HOSPITAL_BASED_OUTPATIENT_CLINIC_OR_DEPARTMENT_OTHER): Admission: AD | Admit: 2016-10-30 | Payer: BLUE CROSS/BLUE SHIELD | Source: Ambulatory Visit

## 2016-10-30 ENCOUNTER — Ambulatory Visit (HOSPITAL_BASED_OUTPATIENT_CLINIC_OR_DEPARTMENT_OTHER): Payer: BLUE CROSS/BLUE SHIELD

## 2016-11-04 ENCOUNTER — Ambulatory Visit (HOSPITAL_BASED_OUTPATIENT_CLINIC_OR_DEPARTMENT_OTHER)
Admission: RE | Admit: 2016-11-04 | Discharge: 2016-11-04 | Disposition: A | Discharge status: Home / Self Care | Payer: BLUE CROSS/BLUE SHIELD | Source: Ambulatory Visit | Attending: Family Medicine | Admitting: Family Medicine

## 2016-11-04 ENCOUNTER — Encounter (HOSPITAL_BASED_OUTPATIENT_CLINIC_OR_DEPARTMENT_OTHER): Payer: Self-pay

## 2016-11-04 DIAGNOSIS — Z136 Encounter for screening for cardiovascular disorders: Secondary | ICD-10-CM

## 2016-11-09 ENCOUNTER — Encounter: Payer: Self-pay | Admitting: Physician Assistant

## 2016-11-09 ENCOUNTER — Ambulatory Visit: Payer: BLUE CROSS/BLUE SHIELD | Admitting: Physician Assistant

## 2016-11-09 VITALS — BP 128/76 | HR 72 | Ht 66.0 in | Wt 173.4 lb

## 2016-11-09 DIAGNOSIS — E611 Iron deficiency: Secondary | ICD-10-CM

## 2016-11-09 NOTE — Patient Instructions (Addendum)
Follow the instructions on the Hemoccult cards and mail them back to [place] when you are finished or you may take them directly to the lab in the basement of the [facility] building. We will call you with the results.   You have been scheduled for an endoscopy. Please follow written instructions given to you at your visit today. If you use inhalers (even only as needed), please bring them with you on the day of your procedure. Your physician has requested that you go to www.startemmi.com and enter the access code given to you at your visit today. This web site gives a general overview about your procedure. However, you should still follow specific instructions given to you by our office regarding your preparation for the procedure.

## 2016-11-09 NOTE — Progress Notes (Addendum)
Chief Complaint: Iron deficiency  HPI:    [name] is a 66 year old African-American male with a past medical history as listed below, who was referred to me by Shelda Pal* for a complaint of iron deficiency.      Patient follows with Dr. Hilarie Fredrickson.  Patient's last colonoscopy was 03/04/13 and showed normal mucosa in the terminal ileum, mild diverticulosis in the ascending colon, hepatic flexure, splenic flexure, descending colon and sigmoid colon and was otherwise normal.    Patient had recent labs and annual physical on 10/27/16.  This revealed a CBC with a hemoglobin of 13.7 with an MCV low at 23.9.  CMP was normal other than elevated glucose at 118.  Ferritin was low at 8.4.  Iron was low at 39 and percent saturation was low at 8.    Today, the patient tells me that he has been feeling fairly well, but went to his primary care provider for his annual visit and was then told that he was iron deficient.  He started taking 1 iron tablet per day about 2 weeks ago.  Patient tells me that he has been having no symptoms.  He describes that "every now and then I will get tired", but related this to his diabetes.  Patient also tells me every once in a while he will get "shaky" but related this to his diabetes.  He denies any abdominal complaints.  He does tell me his stools have been getting darker ever since starting the iron a couple of weeks ago.    Patient denies previous anemia, fever, chills, blood in his stool, melena, weight loss, anorexia, nausea, vomiting, heartburn, reflux, symptoms that awaken him at night, change in diet or decrease in iron rich foods and he does not donate blood.  Past Medical History:  Diagnosis Date  . Diabetes mellitus   . Hypertension   . Impotence of organic origin   . Knee pain    Right  . Other and unspecified hyperlipidemia    Hypercholesterolemia, Borderline  . Testicular hypofunction     Past Surgical History:  Procedure Laterality Date  .  SHOULDER SURGERY Right 2004, 1990  . WISDOM TOOTH EXTRACTION      Current Outpatient Medications  Medication Sig Dispense Refill  . acyclovir (ZOVIRAX) 800 MG tablet Take 800 mg by mouth daily.     [name] aspirin 81 MG tablet Take 81 mg by mouth daily.      [name] atenolol (TENORMIN) 50 MG tablet TAKE 1 TABLET DAILY 90 tablet 3  . bromocriptine (PARLODEL) 2.5 MG tablet TAKE 1 TABLET AT BEDTIME 90 tablet 2  . clotrimazole-betamethasone (LOTRISONE) cream Apply 1 application topically at bedtime. To feet 45 g 3  . diclofenac (VOLTAREN) 75 MG EC tablet Take 75 mg by mouth 2 (two) times daily.    [name] FARXIGA 10 MG TABS tablet TAKE 1 TABLET DAILY 90 tablet 1  . glucose blood (ONETOUCH VERIO) test strip Use to check blood sugar 1 time per day. 100 each 2  . linagliptin (TRADJENTA) 5 MG TABS tablet Take 1 tablet (5 mg total) by mouth daily. 30 tablet 11  . metFORMIN (GLUCOPHAGE-XR) 500 MG 24 hr tablet TAKE 2 TABLETS TWICE A DAY 360 tablet 0  . Multiple Vitamin (MULTIVITAMIN) tablet Take 1 tablet by mouth daily.      . repaglinide (PRANDIN) 2 MG tablet Take 1 tablet (2 mg total) by mouth 2 (two) times daily before a meal. 180 tablet 3  .  rosuvastatin (CRESTOR) 10 MG tablet Take 1 tablet (10 mg total) by mouth daily. 30 tablet 0  . sildenafil (VIAGRA) 100 MG tablet Take 100 mg by mouth daily as needed. Use as directed      No current facility-administered medications for this visit.     Allergies as of 11/09/2016 - Review Complete 11/09/2016  Allergen Reaction Noted  . Actos [pioglitazone]  01/31/2012  . Azithromycin    . Tetracycline    . Trandolapril    . Other Rash 02/22/2013    Family History  Problem Relation Age of Onset  . Heart disease Mother 52       Deceased  . Heart attack Mother   . Hypertension Mother   . Diabetes Father 32       Deceased  . Hypertension Father   . Heart disease Sister        heart trouble  . Diabetes Brother   . Cancer Paternal Aunt        x1-Bone  . Cancer  Paternal Aunt        multiple  . Heart attack Maternal Grandmother   . Healthy Sister        x6  . Colon cancer Neg Hx     Social History   Socioeconomic History  . Marital status: Married    Spouse name: [name]  . Number of children: 0  . Years of education: Not on file  . Highest education level: Not on file  Social Needs  . Financial resource strain: Not on file  . Food insecurity - worry: Not on file  . Food insecurity - inability: Not on file  . Transportation needs - medical: Not on file  . Transportation needs - non-medical: Not on file  Occupational History    Employer: [employer]  Tobacco Use  . Smoking status: Never Smoker  . Smokeless tobacco: Never Used  Substance and Sexual Activity  . Alcohol use: Yes    Alcohol/week: 0.0 oz    Comment: rare beer  . Drug use: No  . Sexual activity: Yes  Other Topics Concern  . Not on file  Social History Narrative   Regular exercise-yes   Caffeine-2 cups   Married=wife [name] 22 years   Cigar Smoker-quit smoking Jan 2010    Review of Systems:    Constitutional: No weight loss, fever or chills Skin: No rash  Cardiovascular: No chest pain Respiratory: No SOB  Gastrointestinal: See HPI and otherwise negative Genitourinary: No dysuria Neurological: No headache, dizziness or syncope Musculoskeletal: No new muscle or joint pain Hematologic: No bleeding  Psychiatric: No history of depression or anxiety   Physical Exam:  Vital signs: BP 128/76 (BP Location: Left Arm, Patient Position: Sitting, Cuff Size: Normal)   Pulse 72   Ht 5\' 6"  (1.676 m) Comment: height measured without shoes  Wt 173 lb 6 oz (78.6 kg)   BMI 27.98 kg/m   Constitutional:   Pleasant AA male appears to be in NAD, Well developed, Well nourished, alert and cooperative Head:  Normocephalic and atraumatic. Eyes:   PEERL, EOMI. No icterus. Conjunctiva pink. Ears:  Normal auditory acuity. Neck:  Supple Throat: Oral cavity and pharynx  without inflammation, swelling or lesion.  Respiratory: Respirations even and unlabored. Lungs clear to auscultation bilaterally.   No wheezes, crackles, or rhonchi.  Cardiovascular: Normal S1, S2. No MRG. Regular rate and rhythm. No peripheral edema, cyanosis or pallor.  Gastrointestinal:  Soft, nondistended, nontender. No rebound or guarding.  Normal bowel sounds. No appreciable masses or hepatomegaly. Rectal:  Not performed.  Msk:  Symmetrical without gross deformities. Without edema, no deformity or joint abnormality.  Neurologic:  Alert and  oriented x4;  grossly normal neurologically.  Skin:   Dry and intact without significant lesions or rashes. Psychiatric: Demonstrates good judgement and reason without abnormal affect or behaviors.  RELEVANT LABS AND IMAGING: CBC    Component Value Date/Time   WBC 5.9 10/27/2016 0803   RBC 5.91 (H) 10/27/2016 0803   HGB 13.7 10/27/2016 0803   HCT 43.6 10/27/2016 0803   PLT 259.0 10/27/2016 0803   MCV 73.9 (L) 10/27/2016 0803   MCV 84.3 02/04/2013 2024   MCH 26.5 (A) 02/04/2013 2024   MCHC 31.4 10/27/2016 0803   RDW 23.2 (H) 10/27/2016 0803   LYMPHSABS 2.2 10/29/2012 0733   MONOABS 0.5 10/29/2012 0733   EOSABS 0.2 10/29/2012 0733   BASOSABS 0.0 10/29/2012 0733    CMP     Component Value Date/Time   NA 138 10/27/2016 0803   K 3.8 10/27/2016 0803   CL 101 10/27/2016 0803   CO2 29 10/27/2016 0803   GLUCOSE 118 (H) 10/27/2016 0803   BUN 18 10/27/2016 0803   CREATININE 0.98 10/27/2016 0803   CALCIUM 9.6 10/27/2016 0803   PROT 6.9 10/27/2016 0803   ALBUMIN 4.1 10/27/2016 0803   AST 24 10/27/2016 0803   ALT 21 10/27/2016 0803   ALKPHOS 65 10/27/2016 0803   BILITOT 0.5 10/27/2016 0803   GFRNONAA 120.85 05/26/2009 0743   GFRAA 100 10/01/2007 0735    Assessment: 1.  Iron deficiency: Per labs 10/28/16 patient has a low ferritin and percent saturation of 8, started on once daily iron by his PCP, not symptomatic, hgb 13.7,  last  colonoscopy 2015 with no polyps; consider upper GI source of blood loss vs decreased iron absorption vs other  Plan: 1.  At this time we will start with an endoscopy for evaluation of iron deficiency.  Scheduled patient in the [facility] with Dr. Hilarie Fredrickson.  Did discuss risk, benefits, limitations and alternatives and the patient agrees to proceed. 2.  Patient was advised to increase his iron to at least twice daily if he is able. 3.  Ordered Hemoccult cards.  If these are positive could consider adding a colonoscopy to procedures. 4.  Patient to follow in clinic per recommendations from Dr. Hilarie Fredrickson after time of procedure.  Ellouise Newer, PA-C [facility] 11/09/2016, 8:44 AM  Cc: Shelda Pal*   Addendum: Reviewed and agree with initial management. Followup FOBT cards and if positive would add colonoscopy to planned EGD. Pyrtle, Lajuan Lines, MD

## 2016-11-21 ENCOUNTER — Ambulatory Visit: Payer: BLUE CROSS/BLUE SHIELD | Admitting: Endocrinology

## 2016-11-21 ENCOUNTER — Encounter: Payer: Self-pay | Admitting: Endocrinology

## 2016-11-21 VITALS — BP 136/82 | HR 67 | Wt 173.6 lb

## 2016-11-21 DIAGNOSIS — E1159 Type 2 diabetes mellitus with other circulatory complications: Secondary | ICD-10-CM

## 2016-11-21 LAB — POCT GLYCOSYLATED HEMOGLOBIN (HGB A1C): Hemoglobin A1C: 7

## 2016-11-21 MED ORDER — ACARBOSE 25 MG PO TABS: 25.0000 mg | ORAL_TABLET | Freq: Three times a day (TID) | ORAL | 11 refills | Status: DC

## 2016-11-21 NOTE — Progress Notes (Signed)
Subjective:    Patient ID: [name], male    DOB: 09-27-1950, 66 y.o.   MRN: [id number]  HPI Pt returns for f/u of diabetes mellitus: DM type: 2 (but due to lean body habitus, he is felt to be possibly developing type 1) Dx'ed: 0258 Complications: cerebrovascular disease Therapy: 5 oral meds DKA: never.  Severe hypoglycemia: never.  Pancreatitis: never. Other: he can't take pioglitizone, due to edema; he has never taken insulin, but he has learned how.  Interval history: he says cbg's are well-controlled. pt states he feels well in general.  He occasionally misses the repaglinide.  Past Medical History:  Diagnosis Date  . Diabetes mellitus   . Hypertension   . Impotence of organic origin   . Knee pain    Right  . Other and unspecified hyperlipidemia    Hypercholesterolemia, Borderline  . Testicular hypofunction     Past Surgical History:  Procedure Laterality Date  . SHOULDER SURGERY Right 2004, 1990  . WISDOM TOOTH EXTRACTION      Social History   Socioeconomic History  . Marital status: Married    Spouse name: [name]  . Number of children: 0  . Years of education: Not on file  . Highest education level: Not on file  Social Needs  . Financial resource strain: Not on file  . Food insecurity - worry: Not on file  . Food insecurity - inability: Not on file  . Transportation needs - medical: Not on file  . Transportation needs - non-medical: Not on file  Occupational History    Employer: [employer]  Tobacco Use  . Smoking status: Never Smoker  . Smokeless tobacco: Never Used  Substance and Sexual Activity  . Alcohol use: Yes    Alcohol/week: 0.0 oz    Comment: rare beer  . Drug use: No  . Sexual activity: Yes  Other Topics Concern  . Not on file  Social History Narrative   Regular exercise-yes   Caffeine-2 cups   Married=wife [name] 22 years   Cigar Smoker-quit smoking Jan 2010    Current Outpatient Medications on File Prior to Visit   Medication Sig Dispense Refill  . acyclovir (ZOVIRAX) 800 MG tablet Take 800 mg by mouth daily.     [name] aspirin 81 MG tablet Take 81 mg by mouth daily.      [name] atenolol (TENORMIN) 50 MG tablet TAKE 1 TABLET DAILY 90 tablet 3  . bromocriptine (PARLODEL) 2.5 MG tablet TAKE 1 TABLET AT BEDTIME 90 tablet 2  . clotrimazole-betamethasone (LOTRISONE) cream Apply 1 application topically at bedtime. To feet 45 g 3  . diclofenac (VOLTAREN) 75 MG EC tablet Take 75 mg by mouth 2 (two) times daily.    [name] FARXIGA 10 MG TABS tablet TAKE 1 TABLET DAILY 90 tablet 1  . glucose blood (ONETOUCH VERIO) test strip Use to check blood sugar 1 time per day. 100 each 2  . linagliptin (TRADJENTA) 5 MG TABS tablet Take 1 tablet (5 mg total) by mouth daily. 30 tablet 11  . metFORMIN (GLUCOPHAGE-XR) 500 MG 24 hr tablet TAKE 2 TABLETS TWICE A DAY 360 tablet 0  . Multiple Vitamin (MULTIVITAMIN) tablet Take 1 tablet by mouth daily.      . repaglinide (PRANDIN) 2 MG tablet Take 1 tablet (2 mg total) by mouth 2 (two) times daily before a meal. 180 tablet 3  . rosuvastatin (CRESTOR) 10 MG tablet Take 1 tablet (10 mg total) by mouth daily. 30 tablet  0  . sildenafil (VIAGRA) 100 MG tablet Take 100 mg by mouth daily as needed. Use as directed      No current facility-administered medications on file prior to visit.     Allergies  Allergen Reactions  . Actos [Pioglitazone]     edema  . Azithromycin     REACTION: rash  . Tetracycline     REACTION: rash  . Trandolapril     REACTION: ALLERGIC to ACEs w/ Angioedema  . Other Rash    MAGIC MOUTHWASH: CAUSES RASH    Family History  Problem Relation Age of Onset  . Heart disease Mother 15       Deceased  . Heart attack Mother   . Hypertension Mother   . Diabetes Father 67       Deceased  . Hypertension Father   . Heart disease Sister        heart trouble  . Diabetes Brother   . Cancer Paternal Aunt        x1-Bone  . Cancer Paternal Aunt        multiple  . Heart  attack Maternal Grandmother   . Healthy Sister        x6  . Colon cancer Neg Hx     BP 136/82 (BP Location: Left Arm, Patient Position: Sitting, Cuff Size: Normal)   Pulse 67   Wt 173 lb 9.6 oz (78.7 kg)   SpO2 98%   BMI 28.02 kg/m    Review of Systems He denies hypoglycemia.      Objective:   Physical Exam VITAL SIGNS:  See vs page.  GENERAL: no distress Pulses: foot pulses are intact bilaterally.   MSK: no deformity of the feet or ankles.  CV: no edema of the legs or ankles Skin:  no ulcer on the feet or ankles.  normal color and temp on the feet and ankles Neuro: sensation is intact to touch on the feet and ankles.    A1c=7.0%    Assessment & Plan:  Type 2 DM: he needs increased rx, if it can be done with a regimen that avoids or minimizes hypoglycemia.   Patient Instructions  Please come back for a follow-up appointment in 6 months.  I have sent a prescription to your pharmacy, to add "acarbose." check your blood sugar once a day.  vary the time of day when you check, between before the 3 meals, and at bedtime.  also check if you have symptoms of your blood sugar being too high or too low.  please keep a record of the readings and bring it to your next appointment here (or you can bring the meter itself).  You can write it on any piece of paper.  please call us sooner if your blood sugar goes below 70, or if you have a lot of readings over 200.

## 2016-11-21 NOTE — Patient Instructions (Addendum)
Please come back for a follow-up appointment in 6 months.  I have sent a prescription to your pharmacy, to add "acarbose." check your blood sugar once a day.  vary the time of day when you check, between before the 3 meals, and at bedtime.  also check if you have symptoms of your blood sugar being too high or too low.  please keep a record of the readings and bring it to your next appointment here (or you can bring the meter itself).  You can write it on any piece of paper.  please call us sooner if your blood sugar goes below 70, or if you have a lot of readings over 200.

## 2016-11-22 ENCOUNTER — Other Ambulatory Visit: Payer: Self-pay | Admitting: Endocrinology

## 2016-12-14 ENCOUNTER — Telehealth: Payer: Self-pay | Admitting: Internal Medicine

## 2016-12-15 ENCOUNTER — Encounter: Payer: BLUE CROSS/BLUE SHIELD | Admitting: Internal Medicine

## 2016-12-29 ENCOUNTER — Other Ambulatory Visit: Payer: Self-pay | Admitting: Family Medicine

## 2016-12-29 DIAGNOSIS — E1159 Type 2 diabetes mellitus with other circulatory complications: Secondary | ICD-10-CM

## 2017-01-09 ENCOUNTER — Telehealth: Payer: Self-pay | Admitting: Endocrinology

## 2017-01-09 NOTE — Telephone Encounter (Signed)
Pt is asking about the paperwork he dropped off a few weeks back what is the status

## 2017-01-09 NOTE — Telephone Encounter (Signed)
I called patient & informed him that papwerwork was ready at the front desk.

## 2017-01-10 ENCOUNTER — Ambulatory Visit (AMBULATORY_SURGERY_CENTER): Payer: Medicare Other | Admitting: Internal Medicine

## 2017-01-10 ENCOUNTER — Encounter: Payer: Self-pay | Admitting: Internal Medicine

## 2017-01-10 ENCOUNTER — Other Ambulatory Visit: Payer: Self-pay

## 2017-01-10 VITALS — BP 137/76 | HR 61 | Temp 97.8°F | Resp 16 | Ht 66.0 in | Wt 173.0 lb

## 2017-01-10 DIAGNOSIS — E611 Iron deficiency: Secondary | ICD-10-CM

## 2017-01-10 DIAGNOSIS — D509 Iron deficiency anemia, unspecified: Secondary | ICD-10-CM | POA: Diagnosis not present

## 2017-01-10 DIAGNOSIS — K319 Disease of stomach and duodenum, unspecified: Secondary | ICD-10-CM | POA: Diagnosis not present

## 2017-01-10 DIAGNOSIS — I10 Essential (primary) hypertension: Secondary | ICD-10-CM | POA: Diagnosis not present

## 2017-01-10 DIAGNOSIS — E119 Type 2 diabetes mellitus without complications: Secondary | ICD-10-CM | POA: Diagnosis not present

## 2017-01-10 DIAGNOSIS — K2950 Unspecified chronic gastritis without bleeding: Secondary | ICD-10-CM | POA: Diagnosis not present

## 2017-01-10 MED ORDER — SODIUM CHLORIDE 0.9 % IV SOLN: 500.0000 mL | Freq: Once | INTRAVENOUS | Status: DC

## 2017-01-10 NOTE — Progress Notes (Signed)
Called to room to assist during endoscopic procedure.  Patient ID and intended procedure confirmed with present staff. Received instructions for my participation in the procedure from the performing physician.  

## 2017-01-10 NOTE — Patient Instructions (Signed)
**  Handout given on gastritis**   YOU HAD AN ENDOSCOPIC PROCEDURE TODAY: Refer to the procedure report and other information in the discharge instructions given to you for any specific questions about what was found during the examination. If this information does not answer your questions, please call [place] office at [phone] to clarify.   YOU SHOULD EXPECT: Some feelings of bloating in the abdomen. Passage of more gas than usual. Walking can help get rid of the air that was put into your GI tract during the procedure and reduce the bloating. If you had a lower endoscopy (such as a colonoscopy or flexible sigmoidoscopy) you may notice spotting of blood in your stool or on the toilet paper. Some abdominal soreness may be present for a day or two, also.  DIET: Your first meal following the procedure should be a light meal and then it is ok to progress to your normal diet. A half-sandwich or bowl of soup is an example of a good first meal. Heavy or fried foods are harder to digest and may make you feel nauseous or bloated. Drink plenty of fluids but you should avoid alcoholic beverages for 24 hours. If you had a esophageal dilation, please see attached instructions for diet.    ACTIVITY: Your care partner should take you home directly after the procedure. You should plan to take it easy, moving slowly for the rest of the day. You can resume normal activity the day after the procedure however YOU SHOULD NOT DRIVE, use power tools, machinery or perform tasks that involve climbing or major physical exertion for 24 hours (because of the sedation medicines used during the test).   SYMPTOMS TO REPORT IMMEDIATELY: A gastroenterologist can be reached at any hour. Please call [phone]  for any of the following symptoms:   Following upper endoscopy (EGD, EUS, ERCP, esophageal dilation) Vomiting of blood or coffee ground material  New, significant abdominal pain  New, significant chest pain or pain  under the shoulder blades  Painful or persistently difficult swallowing  New shortness of breath  Black, tarry-looking or red, bloody stools  FOLLOW UP:  If any biopsies were taken you will be contacted by phone or by letter within the next 1-3 weeks. Call [phone]  if you have not heard about the biopsies in 3 weeks.  Please also call with any specific questions about appointments or follow up tests.

## 2017-01-10 NOTE — Op Note (Signed)
[place] Patient Name: [name] Procedure Date: 01/10/2017 10:32 AM MRN: [id number] Endoscopist: Jerene Bears , MD Age: 67 Referring MD:  Date of Birth: [birth year] Gender: Male Account #: [id number] Procedure:                Upper GI endoscopy Indications:              Unexplained iron deficiency Medicines:                Monitored Anesthesia Care Procedure:                Pre-Anesthesia Assessment:                           - Prior to the procedure, a History and Physical                            was performed, and patient medications and                            allergies were reviewed. The patient's tolerance of                            previous anesthesia was also reviewed. The risks                            and benefits of the procedure and the sedation                            options and risks were discussed with the patient.                            All questions were answered, and informed consent                            was obtained. Prior Anticoagulants: The patient has                            taken no previous anticoagulant or antiplatelet                            agents. ASA Grade Assessment: II - A patient with                            mild systemic disease. After reviewing the risks                            and benefits, the patient was deemed in                            satisfactory condition to undergo the procedure.                           After obtaining informed consent, the endoscope was  passed under direct vision. Throughout the                            procedure, the patient's blood pressure, pulse, and                            oxygen saturations were monitored continuously. The                            Endoscope was introduced through the mouth, and                            advanced to the second part of duodenum. The upper                            GI endoscopy was accomplished  without difficulty.                            The patient tolerated the procedure well. Scope In: Scope Out: Findings:                 The examined esophagus was normal.                           A small, 1 cm, hiatal hernia was present.                           Localized mild inflammation characterized by                            striped erosions and erythema was found in the                            gastric antrum. Biopsies were taken with a cold                            forceps for histology and Helicobacter pylori                            testing.                           The examined duodenum was normal. Biopsies for                            histology were taken with a cold forceps for                            evaluation of celiac disease. Complications:            No immediate complications. Estimated Blood Loss:     Estimated blood loss was minimal. Impression:               - Normal esophagus.                           -  Small hiatal hernia.                           - Gastritis. Biopsied.                           - Normal examined duodenum. Biopsied. Recommendation:           - Patient has a contact number available for                            emergencies. The signs and symptoms of potential                            delayed complications were discussed with the                            patient. Return to normal activities tomorrow.                            Written discharge instructions were provided to the                            patient.                           - Resume previous diet.                           - Continue present medications.                           - Await pathology results.                           - Please return the fecal occult blood testing                            cards previously given. Wait 1 week before                            performing this test given biopsies performed                            today. If  positive, repeat colonoscopy is                            recommended.                           - Office follow-up in 3 months for continuity and                            to repeat blood and iron counts. Jerene Bears, MD 01/10/2017 11:10:58 AM This report has been signed electronically.

## 2017-01-10 NOTE — Progress Notes (Signed)
Report given to PACU, vss 

## 2017-01-11 ENCOUNTER — Telehealth: Payer: Self-pay | Admitting: *Deleted

## 2017-01-11 NOTE — Telephone Encounter (Signed)
Left message on f/u call 

## 2017-01-11 NOTE — Telephone Encounter (Signed)
  Follow up Call-  Call back number 01/10/2017  Post procedure Call Back phone  # [phone]  Permission to leave phone message Yes  Some recent data might be hidden     Patient questions:  Do you have a fever, pain , or abdominal swelling? No. Pain Score  0 *  Have you tolerated food without any problems? Yes.    Have you been able to return to your normal activities? Yes.    Do you have any questions about your discharge instructions: Diet   No. Medications  No. Follow up visit  No.  Do you have questions or concerns about your Care? No.  Actions: * If pain score is 4 or above: No action needed, pain <4.

## 2017-01-13 ENCOUNTER — Telehealth: Payer: Self-pay | Admitting: Endocrinology

## 2017-01-13 NOTE — Telephone Encounter (Signed)
I called patient & paperwork that was filled out stated that he was a smoker & had not quit. I looked it patient's chart & it states that he isn't a smoker & never was. Patient claimed he did some but quit in his 20's. He is concerned because it goes to [employer]. He is bringing paperwork back up here hopefully Monday weather dependant.

## 2017-01-13 NOTE — Telephone Encounter (Signed)
Pt called about a paper he sent over and we faxed back. He states some of the things that were put on the paper he does not agree.  it was a paper that the insurance company needed for pt  Please advise

## 2017-01-16 ENCOUNTER — Encounter: Payer: Self-pay | Admitting: Internal Medicine

## 2017-01-17 ENCOUNTER — Telehealth: Payer: Self-pay

## 2017-01-17 NOTE — Telephone Encounter (Signed)
I called to inform patient that paperwork was corrected & faxed to [facility].

## 2017-01-25 DIAGNOSIS — E119 Type 2 diabetes mellitus without complications: Secondary | ICD-10-CM | POA: Diagnosis not present

## 2017-02-01 NOTE — Telephone Encounter (Signed)
Done

## 2017-02-13 ENCOUNTER — Other Ambulatory Visit: Payer: Self-pay | Admitting: Endocrinology

## 2017-02-16 ENCOUNTER — Other Ambulatory Visit: Payer: Self-pay | Admitting: Family Medicine

## 2017-02-20 ENCOUNTER — Ambulatory Visit: Payer: BLUE CROSS/BLUE SHIELD | Admitting: Endocrinology

## 2017-02-24 ENCOUNTER — Telehealth: Payer: Self-pay

## 2017-02-24 ENCOUNTER — Other Ambulatory Visit: Payer: Self-pay

## 2017-02-24 MED ORDER — CANAGLIFLOZIN 300 MG PO TABS: 300.0000 mg | ORAL_TABLET | Freq: Every day | ORAL | 11 refills | Status: DC

## 2017-02-24 NOTE — Telephone Encounter (Signed)
I called and let patient know [name] was changed to [place] since PA was denied.

## 2017-02-27 ENCOUNTER — Telehealth: Payer: Self-pay

## 2017-02-27 MED ORDER — EMPAGLIFLOZIN 25 MG PO TABS: 25.0000 mg | ORAL_TABLET | Freq: Every day | ORAL | 0 refills | Status: DC

## 2017-02-27 NOTE — Telephone Encounter (Signed)
Sent!

## 2017-02-27 NOTE — Telephone Encounter (Signed)
jardiance 25 mg qd.  Thank you.

## 2017-02-27 NOTE — Addendum Note (Signed)
Addended by: Drucilla Schmidt on: 02/27/2017 05:00 PM   Modules accepted: Orders

## 2017-02-27 NOTE — Telephone Encounter (Signed)
Invokana is no longer covered on pts insurance, Jardiance [place] or [place] is. Please advise

## 2017-02-28 ENCOUNTER — Telehealth: Payer: Self-pay | Admitting: Endocrinology

## 2017-02-28 NOTE — Telephone Encounter (Signed)
Patient is calling with a question for his medication. Please advise

## 2017-03-01 ENCOUNTER — Other Ambulatory Visit: Payer: Self-pay

## 2017-03-01 MED ORDER — EMPAGLIFLOZIN 25 MG PO TABS: 25.0000 mg | ORAL_TABLET | Freq: Every day | ORAL | 2 refills | Status: DC

## 2017-03-01 NOTE — Telephone Encounter (Signed)
This has been resolved patient is taking jardiance instead of invokana I explained to him that his insurance would not cover invokana anymore- I [facility] in [facility]

## 2017-03-01 NOTE — Telephone Encounter (Signed)
LMTCB

## 2017-03-15 DIAGNOSIS — E291 Testicular hypofunction: Secondary | ICD-10-CM | POA: Diagnosis not present

## 2017-03-15 DIAGNOSIS — R948 Abnormal results of function studies of other organs and systems: Secondary | ICD-10-CM | POA: Diagnosis not present

## 2017-03-16 ENCOUNTER — Other Ambulatory Visit: Payer: Self-pay

## 2017-03-16 ENCOUNTER — Telehealth: Payer: Self-pay | Admitting: Endocrinology

## 2017-03-16 ENCOUNTER — Telehealth: Payer: Self-pay

## 2017-03-16 MED ORDER — FREESTYLE FREEDOM LITE W/DEVICE KIT: 1.0000 | PACK | Freq: Every day | 0 refills | Status: DC

## 2017-03-16 MED ORDER — GLUCOSE BLOOD VI STRP: ORAL_STRIP | 12 refills | Status: DC

## 2017-03-16 NOTE — Telephone Encounter (Signed)
I called and LVM for patient to call back to advise me on which meter he was wanting me to send in the Freestyle brand.

## 2017-03-16 NOTE — Telephone Encounter (Signed)
Patient stated he received a letter stating his did not need a prescription for the Freestyle Meter. But when he goes to his pharmacy they states he has to have one in order to receive the meter  Please advise     [facility] [place] [place] - [address] [place] AT [address]

## 2017-03-16 NOTE — Telephone Encounter (Signed)
Patient called back & asked that I send in freestyle freedom lite meter. I have sent meter with strips to pharmacy.

## 2017-03-22 DIAGNOSIS — E291 Testicular hypofunction: Secondary | ICD-10-CM | POA: Diagnosis not present

## 2017-03-29 ENCOUNTER — Encounter: Payer: Self-pay | Admitting: Internal Medicine

## 2017-05-16 ENCOUNTER — Other Ambulatory Visit: Payer: Self-pay | Admitting: Family Medicine

## 2017-05-22 ENCOUNTER — Ambulatory Visit (INDEPENDENT_AMBULATORY_CARE_PROVIDER_SITE_OTHER): Payer: Medicare Other | Admitting: Endocrinology

## 2017-05-22 ENCOUNTER — Encounter: Payer: Self-pay | Admitting: Endocrinology

## 2017-05-22 VITALS — BP 112/64 | HR 69 | Wt 171.6 lb

## 2017-05-22 DIAGNOSIS — E1159 Type 2 diabetes mellitus with other circulatory complications: Secondary | ICD-10-CM | POA: Diagnosis not present

## 2017-05-22 LAB — POCT GLYCOSYLATED HEMOGLOBIN (HGB A1C): HEMOGLOBIN A1C: 7.2

## 2017-05-22 NOTE — Progress Notes (Signed)
Subjective:    Patient ID: [name], male    DOB: Sep 13, 1950, 67 y.o.   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  Dx'ed: 2694 Complications: cerebrovascular disease.  Therapy: 6 oral meds DKA: never.  Severe hypoglycemia: never.  Pancreatitis: never. Other: he can't take pioglitizone, due to edema; he has never taken insulin, but he has learned how.  Interval history: he says cbg's are well-controlled. pt states he feels well in general, except for intermitt lightheadedness.   Past Medical History:  Diagnosis Date  . Diabetes mellitus   . Hypertension   . Impotence of organic origin   . Knee pain    Right  . Other and unspecified hyperlipidemia    Hypercholesterolemia, Borderline  . Testicular hypofunction     Past Surgical History:  Procedure Laterality Date  . SHOULDER SURGERY Right 2004, 1990  . WISDOM TOOTH EXTRACTION      Social History   Socioeconomic History  . Marital status: Married    Spouse name: [name]  . Number of children: 0  . Years of education: Not on file  . Highest education level: Not on file  Occupational History    Employer: [employer]  Social Needs  . Financial resource strain: Not on file  . Food insecurity:    Worry: Not on file    Inability: Not on file  . Transportation needs:    Medical: Not on file    Non-medical: Not on file  Tobacco Use  . Smoking status: Never Smoker  . Smokeless tobacco: Never Used  Substance and Sexual Activity  . Alcohol use: Yes    Alcohol/week: 0.0 oz    Comment: rare beer  . Drug use: No  . Sexual activity: Yes  Lifestyle  . Physical activity:    Days per week: Not on file    Minutes per session: Not on file  . Stress: Not on file  Relationships  . Social connections:    Talks on phone: Not on file    Gets together: Not on file    Attends religious service: Not on file    Active member of club or organization: Not on file    Attends meetings of clubs or organizations: Not on file    Relationship status: Not on file  . Intimate partner violence:    Fear of current or ex partner: Not on file    Emotionally abused: Not on file    Physically abused: Not on file    Forced sexual activity: Not on file  Other Topics Concern  . Not on file  Social History Narrative   Regular exercise-yes   Caffeine-2 cups   Married=wife [name] 22 years   Cigar Smoker-quit smoking Jan 2010    Current Outpatient Medications on File Prior to Visit  Medication Sig Dispense Refill  . acarbose (PRECOSE) 25 MG tablet Take 1 tablet (25 mg total) 3 (three) times daily with meals by mouth. 270 tablet 11  . acyclovir (ZOVIRAX) 800 MG tablet Take 800 mg by mouth daily.     [name] aspirin 81 MG tablet Take 81 mg by mouth daily.      [name] atenolol (TENORMIN) 50 MG tablet TAKE 1 TABLET DAILY 90 tablet 3  . Blood Glucose Monitoring Suppl (FREESTYLE FREEDOM LITE) w/Device KIT 1 Device by Does not apply route daily. 1 each 0  .  bromocriptine (PARLODEL) 2.5 MG tablet TAKE 1 TABLET AT BEDTIME 90 tablet 2  . clotrimazole-betamethasone (LOTRISONE) cream Apply 1 application topically at bedtime. To feet 45 g 3  . diclofenac (VOLTAREN) 75 MG EC tablet Take 75 mg by mouth 2 (two) times daily.    . empagliflozin (JARDIANCE) 25 MG TABS tablet Take 25 mg by mouth daily. 90 tablet 2  . glucose blood (FREESTYLE LITE) test strip Used to check blood sugars daily. 100 each 12  . hydrochlorothiazide (HYDRODIURIL) 25 MG tablet Take 25 mg by mouth.    . IRON PO Take by mouth. Prescribed bid, taking qd    . linagliptin (TRADJENTA) 5 MG TABS tablet Take 1 tablet (5 mg total) by mouth daily. 30 tablet 11  . metFORMIN (GLUCOPHAGE-XR) 500 MG 24 hr tablet TAKE 2 TABLETS TWICE A DAY 360 tablet 0  . Multiple Vitamin (MULTIVITAMIN) tablet Take 1 tablet by mouth daily.      . repaglinide (PRANDIN) 2 MG tablet Take 1 tablet (2  mg total) by mouth 2 (two) times daily before a meal. 180 tablet 3  . rosuvastatin (CRESTOR) 10 MG tablet TAKE 1 TABLET(10 MG) BY MOUTH DAILY 30 tablet 11  . rosuvastatin (CRESTOR) 5 MG tablet TAKE 1 TABLET DAILY 90 tablet 0  . sildenafil (VIAGRA) 100 MG tablet Take 100 mg by mouth daily as needed. Use as directed      No current facility-administered medications on file prior to visit.     Allergies  Allergen Reactions  . Actos [Pioglitazone]     edema  . Azithromycin     REACTION: rash  . Tetracycline     REACTION: rash  . Trandolapril     REACTION: ALLERGIC to ACEs w/ Angioedema  . Other Rash    MAGIC MOUTHWASH: CAUSES RASH    Family History  Problem Relation Age of Onset  . Heart disease Mother 25       Deceased  . Heart attack Mother   . Hypertension Mother   . Diabetes Father 46       Deceased  . Hypertension Father   . Heart disease Sister        heart trouble  . Diabetes Brother   . Cancer Paternal Aunt        x1-Bone  . Cancer Paternal Aunt        multiple  . Heart attack Maternal Grandmother   . Healthy Sister        x6  . Colon cancer Neg Hx     BP 112/64   Pulse 69   Wt 171 lb 9.6 oz (77.8 kg)   SpO2 96%   BMI 27.70 kg/m    Review of Systems Denies LOC and diarrhea.      Objective:   Physical Exam VITAL SIGNS:  See vs page GENERAL: no distress Pulses: foot pulses are intact bilaterally.   MSK: no deformity of the feet or ankles.  CV: no edema of the legs or ankles Skin:  no ulcer on the feet or ankles.  normal color and temp on the feet and ankles Neuro: sensation is intact to touch on the feet and ankles.    Lab Results  Component Value Date   CREATININE 0.98 10/27/2016   BUN 18 10/27/2016   NA 138 10/27/2016   K 3.8 10/27/2016   CL 101 10/27/2016   CO2 29 10/27/2016   A1c=7.2%     Assessment & Plan:  Type 2 DM, with atherosclerosis:  worse Lean body habitus: I told pt he will develop type 1 DM with time  Patient  Instructions  Please continue the same medications Please come back for a follow-up appointment in 4 months.  check your blood sugar once a day.  vary the time of day when you check, between before the 3 meals, and at bedtime.  also check if you have symptoms of your blood sugar being too high or too low.  please keep a record of the readings and bring it to your next appointment here (or you can bring the meter itself).  You can write it on any piece of paper.  please call us sooner if your blood sugar goes below 70, or if you have a lot of readings over 200.

## 2017-05-22 NOTE — Patient Instructions (Addendum)
Please continue the same medications Please come back for a follow-up appointment in 4 months.  check your blood sugar once a day.  vary the time of day when you check, between before the 3 meals, and at bedtime.  also check if you have symptoms of your blood sugar being too high or too low.  please keep a record of the readings and bring it to your next appointment here (or you can bring the meter itself).  You can write it on any piece of paper.  please call us sooner if your blood sugar goes below 70, or if you have a lot of readings over 200.  

## 2017-05-24 ENCOUNTER — Other Ambulatory Visit: Payer: Self-pay | Admitting: Endocrinology

## 2017-06-26 DIAGNOSIS — R3914 Feeling of incomplete bladder emptying: Secondary | ICD-10-CM | POA: Diagnosis not present

## 2017-06-26 DIAGNOSIS — R8279 Other abnormal findings on microbiological examination of urine: Secondary | ICD-10-CM | POA: Diagnosis not present

## 2017-06-26 DIAGNOSIS — N3000 Acute cystitis without hematuria: Secondary | ICD-10-CM | POA: Diagnosis not present

## 2017-07-03 DIAGNOSIS — N401 Enlarged prostate with lower urinary tract symptoms: Secondary | ICD-10-CM | POA: Diagnosis not present

## 2017-07-03 DIAGNOSIS — R3914 Feeling of incomplete bladder emptying: Secondary | ICD-10-CM | POA: Diagnosis not present

## 2017-07-13 ENCOUNTER — Encounter: Payer: Self-pay | Admitting: Family Medicine

## 2017-07-13 ENCOUNTER — Ambulatory Visit (INDEPENDENT_AMBULATORY_CARE_PROVIDER_SITE_OTHER): Payer: Medicare Other | Admitting: Family Medicine

## 2017-07-13 ENCOUNTER — Other Ambulatory Visit: Payer: Self-pay

## 2017-07-13 VITALS — BP 122/78 | HR 73 | Temp 97.8°F | Resp 18 | Wt 170.0 lb

## 2017-07-13 DIAGNOSIS — J029 Acute pharyngitis, unspecified: Secondary | ICD-10-CM | POA: Diagnosis not present

## 2017-07-13 DIAGNOSIS — R634 Abnormal weight loss: Secondary | ICD-10-CM

## 2017-07-13 LAB — COMPREHENSIVE METABOLIC PANEL
ALBUMIN: 4.2 g/dL (ref 3.5–5.2)
ALK PHOS: 70 U/L (ref 39–117)
ALT: 26 U/L (ref 0–53)
AST: 28 U/L (ref 0–37)
BUN: 13 mg/dL (ref 6–23)
CO2: 27 mEq/L (ref 19–32)
Calcium: 9.4 mg/dL (ref 8.4–10.5)
Chloride: 102 mEq/L (ref 96–112)
Creatinine, Ser: 1.01 mg/dL (ref 0.40–1.50)
GFR: 95.11 mL/min (ref >60.00)
GLUCOSE: 137 mg/dL — AB (ref 70–99)
POTASSIUM: 5 meq/L (ref 3.5–5.1)
Sodium: 139 mEq/L (ref 135–145)
Total Bilirubin: 0.4 mg/dL (ref 0.2–1.2)
Total Protein: 6.6 g/dL (ref 6.0–8.3)

## 2017-07-13 LAB — CBC
HEMATOCRIT: 45.5 % (ref 39.0–52.0)
HEMOGLOBIN: 14.6 g/dL (ref 13.0–17.0)
MCHC: 32.1 g/dL (ref 30.0–36.0)
MCV: 80.2 fl (ref 78.0–100.0)
Platelets: 258 10*3/uL (ref 150.0–400.0)
RBC: 5.67 Mil/uL (ref 4.22–5.81)
RDW: 17.2 % — AB (ref 11.5–15.5)
WBC: 5.6 10*3/uL (ref 4.0–10.5)

## 2017-07-13 LAB — TSH: TSH: 1.05 u[IU]/mL (ref 0.35–4.50)

## 2017-07-13 MED ORDER — ROSUVASTATIN CALCIUM 5 MG PO TABS: 5.0000 mg | ORAL_TABLET | Freq: Every day | ORAL | 0 refills | Status: DC

## 2017-07-13 NOTE — Patient Instructions (Addendum)
Keep active and eating healthy. Start checking your sugars again at home.   1-2 days to get the results of your labs back. Let's follow up based on the results from this.   You have not lost too much weight according to our scales, but I want you to continue checking at home.   Start using your allergy medication at home again. If your strep test is positive, we will let you know and call in antibiotics.  Let us know if you need anything.

## 2017-07-13 NOTE — Progress Notes (Signed)
Chief Complaint  Patient presents with  . Sore Throat  . Weight Loss    Subjective: Patient is a 66 y.o. male here for unintentional wt loss.  This has been going on for the past month or so. He has not changed his diet. Has not been checking his sugars. He did have a UTI recently and was found to have a lot of glucose in his urine. Recently travelled to [place], no international travel or med changes. +ST for 1 week. Denies cough, sob, fevers, bleeding, diarrhea, skin changes, swelling, increased urination (outside of UTI).   ROS: Heart: Denies chest pain  Endo: As noted in HPI  Past Medical History:  Diagnosis Date  . Diabetes mellitus   . Hypertension   . Impotence of organic origin   . Knee pain    Right  . Other and unspecified hyperlipidemia    Hypercholesterolemia, Borderline  . Testicular hypofunction    Family History  Problem Relation Age of Onset  . Heart disease Mother 52       Deceased  . Heart attack Mother   . Hypertension Mother   . Diabetes Father 64       Deceased  . Hypertension Father   . Heart disease Sister        heart trouble  . Diabetes Brother   . Cancer Paternal Aunt        x1-Bone  . Cancer Paternal Aunt        multiple  . Heart attack Maternal Grandmother   . Healthy Sister        x6  . Colon cancer Neg Hx    Allergies as of 07/13/2017      Reactions   Actos [pioglitazone]    edema   Azithromycin    REACTION: rash   Tetracycline    REACTION: rash   Trandolapril    REACTION: ALLERGIC to ACEs w/ Angioedema   Other Rash   MAGIC MOUTHWASH: CAUSES RASH      Medication List        Accurate as of 07/13/17  8:30 AM. Always use your most recent med list.          acarbose 25 MG tablet Commonly known as:  PRECOSE Take 1 tablet (25 mg total) 3 (three) times daily with meals by mouth.   acyclovir 800 MG tablet Commonly known as:  ZOVIRAX Take 800 mg by mouth daily.   aspirin 81 MG tablet Take 81 mg by mouth daily.     atenolol 50 MG tablet Commonly known as:  TENORMIN TAKE 1 TABLET DAILY   bromocriptine 2.5 MG tablet Commonly known as:  PARLODEL TAKE 1 TABLET AT BEDTIME   clotrimazole-betamethasone cream Commonly known as:  LOTRISONE Apply 1 application topically at bedtime. To feet   diclofenac 75 MG EC tablet Commonly known as:  VOLTAREN Take 75 mg by mouth 2 (two) times daily.   empagliflozin 25 MG Tabs tablet Commonly known as:  JARDIANCE Take 25 mg by mouth daily.   FREESTYLE FREEDOM LITE w/Device Kit 1 Device by Does not apply route daily.   glucose blood test strip Commonly known as:  FREESTYLE LITE Used to check blood sugars daily.   hydrochlorothiazide 25 MG tablet Commonly known as:  HYDRODIURIL Take 25 mg by mouth.   IRON PO Take by mouth. Prescribed bid, taking qd   linagliptin 5 MG Tabs tablet Commonly known as:  TRADJENTA Take 1 tablet (5 mg total) by mouth daily.     metFORMIN 500 MG 24 hr tablet Commonly known as:  GLUCOPHAGE-XR TAKE 2 TABLETS TWICE A DAY   multivitamin tablet Take 1 tablet by mouth daily.   repaglinide 2 MG tablet Commonly known as:  PRANDIN Take 1 tablet (2 mg total) by mouth 2 (two) times daily before a meal.   rosuvastatin 10 MG tablet Commonly known as:  CRESTOR TAKE 1 TABLET(10 MG) BY MOUTH DAILY   rosuvastatin 5 MG tablet Commonly known as:  CRESTOR TAKE 1 TABLET DAILY   sildenafil 100 MG tablet Commonly known as:  VIAGRA Take 100 mg by mouth daily as needed. Use as directed      Objective: BP 122/78 (BP Location: Left Arm, Patient Position: Sitting, Cuff Size: Normal)   Pulse 73   Temp 97.8 F (36.6 C) (Oral)   Resp 18   Wt 170 lb (77.1 kg)   SpO2 94%   BMI 27.44 kg/m  General: Awake, appears stated age HEENT: MMM, EOMi Heart: RRR, no LE edema Neck: No masses or asymmetry, no tender adenopathy Abd: Soft, NT, ND, BS+ Lungs: CTAB, no rales, wheezes or rhonchi. No accessory muscle use Psych: Age appropriate  judgment and insight, normal affect and mood  Assessment and Plan: Unintentional weight loss - Plan: CBC, Comprehensive metabolic panel, TSH  Sore throat  Orders as above. Ck labs, I suspect elevated sugars as the culprit given his hx.  1/4 Centor, rapid strep neg. Start on PO antihistamine as cause. Fu in 4 weeks pending above. The patient voiced understanding and agreement to the plan.  Nicholas Paul Wendling, DO 07/13/17  8:30 AM      

## 2017-07-24 ENCOUNTER — Other Ambulatory Visit: Payer: Self-pay

## 2017-07-24 ENCOUNTER — Other Ambulatory Visit: Payer: Self-pay | Admitting: Family Medicine

## 2017-07-24 DIAGNOSIS — E1159 Type 2 diabetes mellitus with other circulatory complications: Secondary | ICD-10-CM

## 2017-07-24 MED ORDER — REPAGLINIDE 2 MG PO TABS: 2.0000 mg | ORAL_TABLET | Freq: Two times a day (BID) | ORAL | 3 refills | Status: DC

## 2017-07-24 MED ORDER — ROSUVASTATIN CALCIUM 10 MG PO TABS: ORAL_TABLET | ORAL | 2 refills | Status: DC

## 2017-07-25 DIAGNOSIS — M25511 Pain in right shoulder: Secondary | ICD-10-CM | POA: Diagnosis not present

## 2017-07-27 ENCOUNTER — Other Ambulatory Visit: Payer: Self-pay

## 2017-07-27 MED ORDER — REPAGLINIDE 2 MG PO TABS: 2.0000 mg | ORAL_TABLET | Freq: Two times a day (BID) | ORAL | 3 refills | Status: DC

## 2017-08-03 DIAGNOSIS — L821 Other seborrheic keratosis: Secondary | ICD-10-CM | POA: Diagnosis not present

## 2017-08-03 DIAGNOSIS — D225 Melanocytic nevi of trunk: Secondary | ICD-10-CM | POA: Diagnosis not present

## 2017-08-03 DIAGNOSIS — D2272 Melanocytic nevi of left lower limb, including hip: Secondary | ICD-10-CM | POA: Diagnosis not present

## 2017-08-03 DIAGNOSIS — B0089 Other herpesviral infection: Secondary | ICD-10-CM | POA: Diagnosis not present

## 2017-08-03 DIAGNOSIS — L2089 Other atopic dermatitis: Secondary | ICD-10-CM | POA: Diagnosis not present

## 2017-08-04 DIAGNOSIS — M25511 Pain in right shoulder: Secondary | ICD-10-CM | POA: Diagnosis not present

## 2017-08-10 DIAGNOSIS — M19011 Primary osteoarthritis, right shoulder: Secondary | ICD-10-CM | POA: Diagnosis not present

## 2017-08-29 DIAGNOSIS — M25511 Pain in right shoulder: Secondary | ICD-10-CM | POA: Diagnosis not present

## 2017-08-29 DIAGNOSIS — M19011 Primary osteoarthritis, right shoulder: Secondary | ICD-10-CM | POA: Diagnosis not present

## 2017-09-13 ENCOUNTER — Other Ambulatory Visit: Payer: Self-pay | Admitting: Endocrinology

## 2017-09-22 ENCOUNTER — Ambulatory Visit (INDEPENDENT_AMBULATORY_CARE_PROVIDER_SITE_OTHER): Payer: Medicare Other | Admitting: Endocrinology

## 2017-09-22 ENCOUNTER — Encounter: Payer: Self-pay | Admitting: Endocrinology

## 2017-09-22 VITALS — BP 118/76 | HR 67 | Ht 66.0 in | Wt 169.2 lb

## 2017-09-22 DIAGNOSIS — E1159 Type 2 diabetes mellitus with other circulatory complications: Secondary | ICD-10-CM

## 2017-09-22 DIAGNOSIS — Z23 Encounter for immunization: Secondary | ICD-10-CM

## 2017-09-22 LAB — POCT GLYCOSYLATED HEMOGLOBIN (HGB A1C): HEMOGLOBIN A1C: 7.4 % — AB (ref 4.0–5.6)

## 2017-09-22 MED ORDER — CLOTRIMAZOLE-BETAMETHASONE 1-0.05 % EX CREA: 1.0000 "application " | TOPICAL_CREAM | Freq: Every day | CUTANEOUS | 3 refills | Status: DC

## 2017-09-22 NOTE — Progress Notes (Signed)
Subjective:    Patient ID: [name], male    DOB: 06-07-1950, 67 y.o.   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  Dx'ed: 0865 Complications: cerebrovascular disease.  Therapy: 6 oral meds DKA: never.  Severe hypoglycemia: never.  Pancreatitis: never. Other: he can't take pioglitizone, due to edema; he has never taken insulin, but he has learned how.  Interval history: he says cbg's are well-controlled. pt states he feels well in general.  Past Medical History:  Diagnosis Date  . Diabetes mellitus   . Hypertension   . Impotence of organic origin   . Knee pain    Right  . Other and unspecified hyperlipidemia    Hypercholesterolemia, Borderline  . Testicular hypofunction     Past Surgical History:  Procedure Laterality Date  . SHOULDER SURGERY Right 2004, 1990  . WISDOM TOOTH EXTRACTION      Social History   Socioeconomic History  . Marital status: Married    Spouse name: [name]  . Number of children: 0  . Years of education: Not on file  . Highest education level: Not on file  Occupational History    Employer: [employer]  Social Needs  . Financial resource strain: Not on file  . Food insecurity:    Worry: Not on file    Inability: Not on file  . Transportation needs:    Medical: Not on file    Non-medical: Not on file  Tobacco Use  . Smoking status: Never Smoker  . Smokeless tobacco: Never Used  Substance and Sexual Activity  . Alcohol use: Yes    Alcohol/week: 0.0 standard drinks    Comment: rare beer  . Drug use: No  . Sexual activity: Yes  Lifestyle  . Physical activity:    Days per week: Not on file    Minutes per session: Not on file  . Stress: Not on file  Relationships  . Social connections:    Talks on phone: Not on file    Gets together: Not on file    Attends religious service: Not on file    Active member of club or  organization: Not on file    Attends meetings of clubs or organizations: Not on file    Relationship status: Not on file  . Intimate partner violence:    Fear of current or ex partner: Not on file    Emotionally abused: Not on file    Physically abused: Not on file    Forced sexual activity: Not on file  Other Topics Concern  . Not on file  Social History Narrative   Regular exercise-yes   Caffeine-2 cups   Married=wife [name] 22 years   Cigar Smoker-quit smoking Jan 2010    Current Outpatient Medications on File Prior to Visit  Medication Sig Dispense Refill  . acarbose (PRECOSE) 25 MG tablet Take 1 tablet (25 mg total) 3 (three) times daily with meals by mouth. 270 tablet 11  . acyclovir (ZOVIRAX) 800 MG tablet Take 800 mg by mouth daily.     [name] aspirin 81 MG tablet Take 81 mg by mouth daily.      [name] atenolol (TENORMIN) 50 MG tablet TAKE 1 TABLET DAILY 90 tablet 3  . Blood Glucose Monitoring Suppl (FREESTYLE FREEDOM LITE) w/Device KIT 1 Device by Does not apply route daily. 1 each 0  . bromocriptine (  PARLODEL) 2.5 MG tablet TAKE 1 TABLET AT BEDTIME 90 tablet 2  . diclofenac (VOLTAREN) 75 MG EC tablet Take 75 mg by mouth 2 (two) times daily.    . empagliflozin (JARDIANCE) 25 MG TABS tablet Take 25 mg by mouth daily. 90 tablet 2  . glucose blood (FREESTYLE LITE) test strip Used to check blood sugars daily. 100 each 12  . hydrochlorothiazide (HYDRODIURIL) 25 MG tablet Take 25 mg by mouth.    . IRON PO Take by mouth. Prescribed bid, taking qd    . linagliptin (TRADJENTA) 5 MG TABS tablet Take 1 tablet (5 mg total) by mouth daily. 30 tablet 11  . metFORMIN (GLUCOPHAGE-XR) 500 MG 24 hr tablet TAKE 2 TABLETS TWICE A DAY 360 tablet 4  . Multiple Vitamin (MULTIVITAMIN) tablet Take 1 tablet by mouth daily.      . repaglinide (PRANDIN) 2 MG tablet Take 1 tablet (2 mg total) by mouth 2 (two) times daily before a meal. 180 tablet 3  . rosuvastatin (CRESTOR) 10 MG tablet TAKE 1 TABLET(10 MG) BY  MOUTH DAILY 90 tablet 2  . sildenafil (VIAGRA) 100 MG tablet Take 100 mg by mouth daily as needed. Use as directed      No current facility-administered medications on file prior to visit.     Allergies  Allergen Reactions  . Mouthwashes Rash    Allergic to Magic Mouthwash  . Actos [Pioglitazone]     edema  . Azithromycin     REACTION: rash  . Tetracycline     REACTION: rash  . Trandolapril     REACTION: ALLERGIC to ACEs w/ Angioedema  . Other Rash    MAGIC MOUTHWASH: CAUSES RASH    Family History  Problem Relation Age of Onset  . Heart disease Mother 57       Deceased  . Heart attack Mother   . Hypertension Mother   . Diabetes Father 59       Deceased  . Hypertension Father   . Heart disease Sister        heart trouble  . Diabetes Brother   . Cancer Paternal Aunt        x1-Bone  . Cancer Paternal Aunt        multiple  . Heart attack Maternal Grandmother   . Healthy Sister        x6  . Colon cancer Neg Hx     BP 118/76   Pulse 67   Ht 5' 6"  (1.676 m)   Wt 169 lb 3.2 oz (76.7 kg)   SpO2 97%   BMI 27.31 kg/m    Review of Systems He denies hypoglycemia.  Itching on feet persists    Objective:   Physical Exam VITAL SIGNS:  See vs page GENERAL: no distress Pulses: foot pulses are intact bilaterally.   MSK: no deformity of the feet or ankles.  CV: no edema of the legs or ankles Skin:  no ulcer on the feet or ankles, and no rash is seen.  normal color and temp on the feet and ankles Neuro: sensation is intact to touch on the feet and ankles.    Lab Results  Component Value Date   HGBA1C 7.4 (A) 09/22/2017       Assessment & Plan:  Type 2 DM, with CVD: worse. I advised changing tradjenta to Ozempic. Foot itching, persistent: heat rash vs cutaneous mycosis.  I refilled lotrisone  Patient Instructions  Please continue the same medications Please come back  for a follow-up appointment in 4 months.  check your blood sugar once a day.  vary the time of  day when you check, between before the 3 meals, and at bedtime.  also check if you have symptoms of your blood sugar being too high or too low.  please keep a record of the readings and bring it to your next appointment here (or you can bring the meter itself).  You can write it on any piece of paper.  please call us sooner if your blood sugar goes below 70, or if you have a lot of readings over 200.

## 2017-09-22 NOTE — Patient Instructions (Signed)
Please continue the same medications Please come back for a follow-up appointment in 4 months.  check your blood sugar once a day.  vary the time of day when you check, between before the 3 meals, and at bedtime.  also check if you have symptoms of your blood sugar being too high or too low.  please keep a record of the readings and bring it to your next appointment here (or you can bring the meter itself).  You can write it on any piece of paper.  please call us sooner if your blood sugar goes below 70, or if you have a lot of readings over 200.  

## 2017-10-09 DIAGNOSIS — R3914 Feeling of incomplete bladder emptying: Secondary | ICD-10-CM | POA: Diagnosis not present

## 2017-10-09 DIAGNOSIS — N401 Enlarged prostate with lower urinary tract symptoms: Secondary | ICD-10-CM | POA: Diagnosis not present

## 2017-10-24 ENCOUNTER — Other Ambulatory Visit: Payer: Self-pay

## 2017-10-24 ENCOUNTER — Other Ambulatory Visit: Payer: Self-pay | Admitting: Internal Medicine

## 2017-10-24 MED ORDER — ACARBOSE 25 MG PO TABS: 25.0000 mg | ORAL_TABLET | Freq: Three times a day (TID) | ORAL | 11 refills | Status: DC

## 2017-11-20 DIAGNOSIS — R3914 Feeling of incomplete bladder emptying: Secondary | ICD-10-CM | POA: Diagnosis not present

## 2017-11-20 DIAGNOSIS — N401 Enlarged prostate with lower urinary tract symptoms: Secondary | ICD-10-CM | POA: Diagnosis not present

## 2017-11-21 ENCOUNTER — Other Ambulatory Visit: Payer: Self-pay | Admitting: Urology

## 2017-12-05 ENCOUNTER — Encounter (HOSPITAL_BASED_OUTPATIENT_CLINIC_OR_DEPARTMENT_OTHER): Payer: Self-pay | Admitting: *Deleted

## 2017-12-05 NOTE — Progress Notes (Signed)
Spoke w/ pt via phone for pre-op interview.  Npo after mn.  Arrive at 0830.  Needs istat 8 and ekg.  Will take crestor and atenolol am dos w/ sips of water.

## 2017-12-10 NOTE — H&P (Signed)
Office Visit Report     11/20/2017   --------------------------------------------------------------------------------   Henderson [place]  MRN: [id number]  PRIMARY CARE:    DOB: [birth year], 67 year old Male  REFERRING:  Scott M. Lenna Gilford, MD  SSN: -[id number]  PROVIDER:  Festus Aloe, M.D.    LOCATION:  [facility] [phone]   --------------------------------------------------------------------------------   CC: I have symptoms of an enlarged prostate.  HPI: [name] is a 67 year-old male established patient who is here for symptoms of enlarged prostate.  He first noticed the symptoms approximately 05/03/2017. His symptoms have gotten worse over the last year. He has been treated with Flomax. The patient has never had a surgical procedure for bladder outlet obstruction to his prostate.   APR 2017 cysto with mild BPH. LUTS have gotten significantly worse with the adding of daily Jariance for diabetes. He does admit to 2-3 caffeine drinks daily. UTI and PVR of 244 ml. Started tamsulosin Jun 2019.   He continued tamsulosin and has a better flow. Bladder scan only 161 ml and he didn't void prior. AUASS = 7.   He returns and pvr 163 ml. Cystoscopy shows an obstructing prostate from lateral lobe hypertrophy. He has a shorter prostate.     ALLERGIES: azithromycin - Other Reaction, Unknown Mouthwash LIQD pioglitazone Tetracycline - Skin Rash trandolapril    MEDICATIONS: Tamsulosin Hcl 0.4 mg capsule 1 capsule PO Q PM  Viagra 100 mg tablet 1 tablet PO as needed for intimacy  Acyclovir 800 mg tablet Oral  Aspirin 81 MG TABS Oral  Atenolol 50 MG Oral Tablet Oral  Bromocriptine Mesylate 2.5 mg tablet Oral  Jardiance  Metformin Hcl Er 500 mg tablet, extended release 24 hr Oral  Multi-Vitamin TABS Oral  Testosterone Cypionate 200 mg/ml vial 1 ml IM Q2WK     GU PSH: None   NON-GU PSH: Shoulder Surgery (Unspecified)    GU PMH: BPH w/LUTS - 10/09/2017, - 2017, Benign  localized prostatic hyperplasia with lower urinary tract symptoms (LUTS), - 2015 Acute Cystitis/UTI (Acute), Culture urine. Empirically begin Macrobid 100 mg 1 po BID X 7 days till culture complete - 06/26/2017 Incomplete bladder emptying (Acute), Begin timed/double voiding. Tamsulosin 0.4 mg 1 po daily. F/U 1 week for PVR. 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 Refilled. See in 1 year. 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    NON-GU PMH: Encounter for general adult medical examination without abnormal findings, Encounter for preventive health examination - 2017 Cardiac murmur, unspecified, Murmurs - 2014 Personal history of other diseases of the circulatory system, History of hypertension - 2014 Personal history of other endocrine, nutritional and metabolic disease, History of hypercholesterolemia - 2014, History of diabetes mellitus, - 2014 Arthritis Diabetes Type 2    FAMILY HISTORY: Father Deceased At Age64 ___ - Runs In Family Mother Deceased At Age 36 from diabetic complicati - Runs In Family No pertinent family history - Other   SOCIAL HISTORY: Marital Status: Married Preferred Language:  [place]; Ethnicity: Not Hispanic Or Latino; Race: Black or African American Current Smoking Status: Patient has never smoked.  Does not use smokeless tobacco. Drinks 1 drink per month. Types of alcohol consumed: Beer.  Does not use drugs. Drinks 3 caffeinated drinks per  day.    REVIEW OF SYSTEMS:    GU Review Male:   Patient denies frequent urination, hard to postpone urination, burning/ pain with urination, get up at night to urinate, leakage of urine, stream starts and stops, trouble starting your stream, have to strain to urinate , erection problems, and penile pain.  Gastrointestinal (Upper):   Patient denies nausea, vomiting, and indigestion/ heartburn.  Gastrointestinal (Lower):   Patient denies constipation and diarrhea.  Constitutional:   Patient denies fever, night sweats, weight loss, and fatigue.  Skin:   Patient denies skin rash/ lesion and itching.  Eyes:   Patient denies blurred vision and double vision.  Ears/ Nose/ Throat:   Patient denies sore throat and sinus problems.  Hematologic/Lymphatic:   Patient denies swollen glands and easy bruising.  Cardiovascular:   Patient denies leg swelling and chest pains.  Respiratory:   Patient denies cough and shortness of breath.  Endocrine:   Patient denies excessive thirst.  Musculoskeletal:   Patient denies back pain and joint pain.  Neurological:   Patient denies headaches and dizziness.  Psychologic:   Patient denies depression and anxiety.   VITAL SIGNS:      11/20/2017 08:18 AM  Weight 167 lb / 75.75 kg  Height 66.5 in / 168.91 cm  BP 128/82 mmHg  Pulse 65 /min  Temperature 97.5 F / 36.3 C  BMI 26.5 kg/m   GU PHYSICAL EXAMINATION:    Scrotum: No lesions. No edema. No cysts. No warts.  Urethral Meatus: Normal size. No lesion, no wart, no discharge, no polyp. Normal location.  Penis: Circumcised, no warts, no cracks. No dorsal Peyronie's plaques, no left corporal Peyronie's plaques, no right corporal Peyronie's plaques, no scarring, no warts. No balanitis, no meatal stenosis.   MULTI-SYSTEM PHYSICAL EXAMINATION:    Constitutional: Well-nourished. No physical deformities. Normally developed. Good grooming.  Neck: Neck symmetrical, not swollen. Normal tracheal position.  Respiratory: No  labored breathing, no use of accessory muscles.   Cardiovascular: Normal temperature, normal extremity pulses, no swelling, no varicosities.  Skin: No paleness, no jaundice, no cyanosis. No lesion, no ulcer, no rash.  Neurologic / Psychiatric: Oriented to time, oriented to place, oriented to person. No depression, no anxiety, no agitation.  Gastrointestinal: No mass, no tenderness, no rigidity, non obese abdomen.     PAST DATA REVIEWED:  Source Of History:  Patient   03/15/17 06/03/16 11/06/15 02/14/15 08/13/14 02/07/14 08/12/13 02/14/13  PSA  Total PSA 0.84 ng/mL 0.88 ng/dl 0.80 ng/dl 1.41  0.90  1.20  1.21  1.12     03/15/17 06/03/16 12/21/15 11/06/15 02/13/15 08/13/14 02/06/14 11/11/13  Hormones  Testosterone, Total 87.3 ng/dL 916.1 pg/dL 258.3 pg/dL 169.3 pg/dL 168  401  564  727     PROCEDURES:         Flexible Cystoscopy - 52000  Risks, benefits, and some of the potential complications of the procedure were discussed with the patient. All questions were answered. Informed consent was obtained. Antibiotic prophylaxis was given -- Cephalexin. Sterile technique and intraurethral analgesia were used.  Meatus:  Normal size. Normal location. Normal condition.  Urethra:  No strictures.  External Sphincter:  Normal.  Verumontanum:  Normal.  Prostate:  Obstructing. Moderate hyperplasia.  Bladder Neck:  Non-obstructing.  Ureteral Orifices:  Normal location. Normal size. Normal shape. Effluxed clear urine.  Bladder:  No trabeculation. No tumors. Normal mucosa. No stones.      The lower urinary tract was carefully examined. The procedure was well-tolerated and without complications. Antibiotic instructions were given. Instructions were given to call the office immediately for bloody urine, difficulty urinating, painful urination, fever, chills, nausea, vomiting or other illness. The patient stated that he understood these instructions and would comply with them.        PVR Ultrasound -  21194  Scanned Volume: 163 cc   ASSESSMENT:      ICD-10 Details  1 GU:   BPH w/LUTS - N40.1   2   Incomplete bladder emptying - R39.14    PLAN:           Schedule Return Visit/Planned Activity: Next Available Appointment - Schedule Surgery          Document Letter(s):  Created for Patient: Clinical Summary         Notes:   bph, inc bladder emptying - I discussed with the patient the nature r/b/a to Urolift including side effects of the procedure, expected post-op course and likelihood of success. We discussed flow symptoms and irritative symptoms typically improve, but frequency and urgency can persist and rarely worsen. We also discussed risk of bleeding, infection, stricture, sexual dysfunction and incontinence among others. All questions answered. He elects to proceed. He was fairly uncomfortable during the procedure even with lidocaine per urethra and I feel like he'd do better at the Surg. Ctr.   cc: Dr. Lenna Gilford      * Signed by Festus Aloe, M.D. on 11/20/17 at 5:05 PM (EST)*     The information contained in this medical record document is considered private and confidential patient information. This information can only be used for the medical diagnosis and/or medical services that are being provided by the patient's selected caregivers. This information can only be distributed outside of the patient's care if the patient agrees and signs waivers of authorization for this information to be sent to an outside source or route.

## 2017-12-12 ENCOUNTER — Other Ambulatory Visit: Payer: Self-pay

## 2017-12-12 ENCOUNTER — Ambulatory Visit (HOSPITAL_BASED_OUTPATIENT_CLINIC_OR_DEPARTMENT_OTHER): Payer: Medicare Other | Admitting: Anesthesiology

## 2017-12-12 ENCOUNTER — Encounter (HOSPITAL_BASED_OUTPATIENT_CLINIC_OR_DEPARTMENT_OTHER)
Admission: RE | Disposition: A | Discharge status: Home / Self Care | Payer: Self-pay | Source: Home / Self Care | Attending: Urology

## 2017-12-12 ENCOUNTER — Encounter (HOSPITAL_BASED_OUTPATIENT_CLINIC_OR_DEPARTMENT_OTHER): Payer: Self-pay | Admitting: *Deleted

## 2017-12-12 ENCOUNTER — Ambulatory Visit (HOSPITAL_BASED_OUTPATIENT_CLINIC_OR_DEPARTMENT_OTHER)
Admission: RE | Admit: 2017-12-12 | Discharge: 2017-12-12 | Disposition: A | Discharge status: Home / Self Care | Payer: Medicare Other | Attending: Urology | Admitting: Urology

## 2017-12-12 DIAGNOSIS — Z881 Allergy status to other antibiotic agents status: Secondary | ICD-10-CM | POA: Diagnosis not present

## 2017-12-12 DIAGNOSIS — R3914 Feeling of incomplete bladder emptying: Secondary | ICD-10-CM | POA: Insufficient documentation

## 2017-12-12 DIAGNOSIS — Z7984 Long term (current) use of oral hypoglycemic drugs: Secondary | ICD-10-CM | POA: Diagnosis not present

## 2017-12-12 DIAGNOSIS — N401 Enlarged prostate with lower urinary tract symptoms: Secondary | ICD-10-CM | POA: Diagnosis not present

## 2017-12-12 DIAGNOSIS — E1151 Type 2 diabetes mellitus with diabetic peripheral angiopathy without gangrene: Secondary | ICD-10-CM | POA: Diagnosis not present

## 2017-12-12 DIAGNOSIS — I10 Essential (primary) hypertension: Secondary | ICD-10-CM | POA: Diagnosis not present

## 2017-12-12 DIAGNOSIS — R3912 Poor urinary stream: Secondary | ICD-10-CM | POA: Diagnosis not present

## 2017-12-12 DIAGNOSIS — Z7982 Long term (current) use of aspirin: Secondary | ICD-10-CM | POA: Insufficient documentation

## 2017-12-12 DIAGNOSIS — Z79899 Other long term (current) drug therapy: Secondary | ICD-10-CM | POA: Insufficient documentation

## 2017-12-12 DIAGNOSIS — N138 Other obstructive and reflux uropathy: Secondary | ICD-10-CM | POA: Insufficient documentation

## 2017-12-12 DIAGNOSIS — E785 Hyperlipidemia, unspecified: Secondary | ICD-10-CM | POA: Diagnosis not present

## 2017-12-12 HISTORY — DX: Hyperlipidemia, unspecified: E78.5

## 2017-12-12 HISTORY — DX: Presence of spectacles and contact lenses: Z97.3

## 2017-12-12 HISTORY — DX: Unspecified osteoarthritis, unspecified site: M19.90

## 2017-12-12 HISTORY — DX: Nocturia: R35.1

## 2017-12-12 HISTORY — DX: Poor urinary stream: R39.12

## 2017-12-12 HISTORY — DX: Iron deficiency anemia, unspecified: D50.9

## 2017-12-12 HISTORY — DX: Occlusion and stenosis of bilateral carotid arteries: I65.23

## 2017-12-12 HISTORY — DX: Benign prostatic hyperplasia without lower urinary tract symptoms: N40.0

## 2017-12-12 HISTORY — DX: Type 2 diabetes mellitus without complications: E11.9

## 2017-12-12 HISTORY — PX: CYSTOSCOPY WITH INSERTION OF UROLIFT: SHX6678

## 2017-12-12 LAB — POCT I-STAT, CHEM 8
BUN: 18 mg/dL (ref 8–23)
Calcium, Ion: 1.17 mmol/L (ref 1.15–1.40)
Chloride: 104 mmol/L (ref 98–111)
Creatinine, Ser: 0.7 mg/dL (ref 0.61–1.24)
Glucose, Bld: 121 mg/dL — ABNORMAL HIGH (ref 70–99)
HEMATOCRIT: 47 % (ref 39.0–52.0)
Hemoglobin: 16 g/dL (ref 13.0–17.0)
Potassium: 4.5 mmol/L (ref 3.5–5.1)
Sodium: 140 mmol/L (ref 135–145)
TCO2: 26 mmol/L (ref 22–32)

## 2017-12-12 LAB — GLUCOSE, CAPILLARY: Glucose-Capillary: 101 mg/dL — ABNORMAL HIGH (ref 70–99)

## 2017-12-12 SURGERY — CYSTOSCOPY WITH INSERTION OF UROLIFT
Anesthesia: General

## 2017-12-12 MED ORDER — PROPOFOL 10 MG/ML IV BOLUS
INTRAVENOUS | Status: AC
  Filled 2017-12-12: qty 40

## 2017-12-12 MED ORDER — LIDOCAINE 2% (20 MG/ML) 5 ML SYRINGE
INTRAMUSCULAR | Status: DC | PRN
  Administered 2017-12-12: 100 mg via INTRAVENOUS

## 2017-12-12 MED ORDER — DEXAMETHASONE SODIUM PHOSPHATE 4 MG/ML IJ SOLN
INTRAMUSCULAR | Status: DC | PRN
  Administered 2017-12-12: 5 mg via INTRAVENOUS

## 2017-12-12 MED ORDER — ONDANSETRON HCL 4 MG/2ML IJ SOLN
INTRAMUSCULAR | Status: AC
  Filled 2017-12-12: qty 2

## 2017-12-12 MED ORDER — LIDOCAINE HCL URETHRAL/MUCOSAL 2 % EX GEL: CUTANEOUS | Status: DC | PRN

## 2017-12-12 MED ORDER — BELLADONNA ALKALOIDS-OPIUM 16.2-60 MG RE SUPP
RECTAL | Status: DC | PRN
  Administered 2017-12-12: 1 via RECTAL

## 2017-12-12 MED ORDER — ACETAMINOPHEN 500 MG PO TABS
ORAL_TABLET | ORAL | Status: AC
  Filled 2017-12-12: qty 2

## 2017-12-12 MED ORDER — CEFAZOLIN SODIUM-DEXTROSE 2-4 GM/100ML-% IV SOLN
INTRAVENOUS | Status: AC
  Filled 2017-12-12: qty 100

## 2017-12-12 MED ORDER — CEFAZOLIN SODIUM-DEXTROSE 2-4 GM/100ML-% IV SOLN
2.0000 g | Freq: Once | INTRAVENOUS | Status: AC
  Administered 2017-12-12: 2 g via INTRAVENOUS
  Filled 2017-12-12: qty 100

## 2017-12-12 MED ORDER — ACETAMINOPHEN 500 MG PO TABS
1000.0000 mg | ORAL_TABLET | Freq: Four times a day (QID) | ORAL | Status: DC | PRN
  Administered 2017-12-12: 1000 mg via ORAL
  Filled 2017-12-12: qty 2

## 2017-12-12 MED ORDER — EPHEDRINE SULFATE-NACL 50-0.9 MG/10ML-% IV SOSY
PREFILLED_SYRINGE | INTRAVENOUS | Status: DC | PRN
  Administered 2017-12-12: 10 mg via INTRAVENOUS

## 2017-12-12 MED ORDER — DEXAMETHASONE SODIUM PHOSPHATE 10 MG/ML IJ SOLN
INTRAMUSCULAR | Status: AC
  Filled 2017-12-12: qty 1

## 2017-12-12 MED ORDER — ONDANSETRON HCL 4 MG/2ML IJ SOLN
INTRAMUSCULAR | Status: DC | PRN
  Administered 2017-12-12: 4 mg via INTRAVENOUS

## 2017-12-12 MED ORDER — LACTATED RINGERS IV SOLN
INTRAVENOUS | Status: DC
  Administered 2017-12-12: 09:00:00 via INTRAVENOUS
  Filled 2017-12-12: qty 1000

## 2017-12-12 MED ORDER — FENTANYL CITRATE (PF) 100 MCG/2ML IJ SOLN
INTRAMUSCULAR | Status: DC | PRN
  Administered 2017-12-12: 50 ug via INTRAVENOUS

## 2017-12-12 MED ORDER — FENTANYL CITRATE (PF) 100 MCG/2ML IJ SOLN
INTRAMUSCULAR | Status: AC
  Filled 2017-12-12: qty 2

## 2017-12-12 MED ORDER — MIDAZOLAM HCL 2 MG/2ML IJ SOLN
INTRAMUSCULAR | Status: AC
  Filled 2017-12-12: qty 2

## 2017-12-12 MED ORDER — CEPHALEXIN 500 MG PO CAPS: 500.0000 mg | ORAL_CAPSULE | Freq: Every day | ORAL | 0 refills | Status: DC

## 2017-12-12 MED ORDER — ARTIFICIAL TEARS OPHTHALMIC OINT
TOPICAL_OINTMENT | OPHTHALMIC | Status: AC
  Filled 2017-12-12: qty 3.5

## 2017-12-12 MED ORDER — LIDOCAINE HCL URETHRAL/MUCOSAL 2 % EX GEL
CUTANEOUS | Status: AC
  Filled 2017-12-12: qty 5

## 2017-12-12 MED ORDER — STERILE WATER FOR IRRIGATION IR SOLN
Status: DC | PRN
  Administered 2017-12-12: 3000 mL

## 2017-12-12 MED ORDER — LIDOCAINE 2% (20 MG/ML) 5 ML SYRINGE
INTRAMUSCULAR | Status: AC
  Filled 2017-12-12: qty 5

## 2017-12-12 MED ORDER — BELLADONNA ALKALOIDS-OPIUM 16.2-60 MG RE SUPP
RECTAL | Status: AC
  Filled 2017-12-12: qty 1

## 2017-12-12 MED ORDER — PROPOFOL 10 MG/ML IV BOLUS
INTRAVENOUS | Status: DC | PRN
  Administered 2017-12-12: 100 mg via INTRAVENOUS

## 2017-12-12 MED ORDER — MIDAZOLAM HCL 5 MG/5ML IJ SOLN
INTRAMUSCULAR | Status: DC | PRN
  Administered 2017-12-12: 2 mg via INTRAVENOUS

## 2017-12-12 MED ORDER — TAMSULOSIN HCL 0.4 MG PO CAPS: 0.4000 mg | ORAL_CAPSULE | Freq: Every day | ORAL | 0 refills | Status: DC

## 2017-12-12 MED ORDER — EPHEDRINE 5 MG/ML INJ
INTRAVENOUS | Status: AC
  Filled 2017-12-12: qty 10

## 2017-12-12 SURGICAL SUPPLY — 22 items
BAG DRAIN URO-CYSTO SKYTR STRL (DRAIN) ×3 IMPLANT
BAG URINE DRAINAGE (UROLOGICAL SUPPLIES) IMPLANT
BAG URINE LEG 19OZ MD ST LTX (BAG) IMPLANT
BAG URINE LEG 500ML (DRAIN) ×3 IMPLANT
CATH COUDE FOLEY 2W 5CC 18FR (CATHETERS) ×3 IMPLANT
CATH FOLEY 2WAY SLVR  5CC 16FR (CATHETERS)
CATH FOLEY 2WAY SLVR 5CC 16FR (CATHETERS) IMPLANT
CLOTH BEACON ORANGE TIMEOUT ST (SAFETY) ×3 IMPLANT
ELECT REM PT RETURN 9FT ADLT (ELECTROSURGICAL)
ELECTRODE REM PT RTRN 9FT ADLT (ELECTROSURGICAL) IMPLANT
GLOVE BIO SURGEON STRL SZ7.5 (GLOVE) ×3 IMPLANT
GOWN STRL REUS W/ TWL XL LVL3 (GOWN DISPOSABLE) ×1 IMPLANT
GOWN STRL REUS W/TWL XL LVL3 (GOWN DISPOSABLE) ×2
KIT TURNOVER CYSTO (KITS) ×3 IMPLANT
MANIFOLD NEPTUNE II (INSTRUMENTS) IMPLANT
NEEDLE HYPO 22GX1.5 SAFETY (NEEDLE) IMPLANT
NS IRRIG 500ML POUR BTL (IV SOLUTION) IMPLANT
PACK CYSTO (CUSTOM PROCEDURE TRAY) ×3 IMPLANT
SYSTEM UROLIFT (Male Continence) ×15 IMPLANT
TUBE CONNECTING 12'X1/4 (SUCTIONS)
TUBE CONNECTING 12X1/4 (SUCTIONS) IMPLANT
WATER STERILE IRR 3000ML UROMA (IV SOLUTION) ×3 IMPLANT

## 2017-12-12 NOTE — Transfer of Care (Signed)
Last Vitals:  Vitals Value Taken Time  BP    Temp    Pulse 52 12/12/2017 10:35 AM  Resp 11 12/12/2017 10:35 AM  SpO2 99 % 12/12/2017 10:35 AM  Vitals shown include unvalidated device data.  Last Pain:  Vitals:   12/12/17 0856  TempSrc:   PainSc: 0-No pain      Patients Stated Pain Goal: 5 (12/12/17 0856)  Immediate Anesthesia Transfer of Care Note  Patient: [name]  Procedure(s) Performed: Procedure(s) (LRB): CYSTOSCOPY WITH INSERTION OF UROLIFT (N/A)  Patient Location: PACU  Anesthesia Type: General  Level of Consciousness: awake, alert  and oriented  Airway & Oxygen Therapy: Patient Spontanous Breathing and Patient connected to nasal cannula oxygen  Post-op Assessment: Report given to PACU RN and Post -op Vital signs reviewed and stable  Post vital signs: Reviewed and stable  Complications: No apparent anesthesia complications

## 2017-12-12 NOTE — Discharge Instructions (Signed)
Post Anesthesia Home Care Instructions  Activity: Get plenty of rest for the remainder of the day. A responsible adult should stay with you for 24 hours following the procedure.  For the next 24 hours, DO NOT: -Drive a car -Paediatric nurse -Drink alcoholic beverages -Take any medication unless instructed by your physician -Make any legal decisions or sign important papers.  Meals: Start with liquid foods such as gelatin or soup. Progress to regular foods as tolerated. Avoid greasy, spicy, heavy foods. If nausea and/or vomiting occur, drink only clear liquids until the nausea and/or vomiting subsides. Call your physician if vomiting continues.  Special Instructions/Symptoms: Your throat may feel dry or sore from the anesthesia or the breathing tube placed in your throat during surgery. If this causes discomfort, gargle with warm salt water. The discomfort should disappear within 24 hours.  If you had a scopolamine patch placed behind your ear for the management of post- operative nausea and/or vomiting:  1. The medication in the patch is effective for 72 hours, after which it should be removed.  Wrap patch in a tissue and discard in the trash. Wash hands thoroughly with soap and water. 2. You may remove the patch earlier than 72 hours if you experience unpleasant side effects which may include dry mouth, dizziness or visual disturbances. 3. Avoid touching the patch. Wash your hands with soap and water after contact with the patch.       Indwelling Urinary Catheter Care, Adult Take good care of your catheter to keep it working and to prevent problems. How to wear your catheter Attach your catheter to your leg with tape (adhesive tape) or a leg strap. Make sure it is not too tight. If you use tape, remove any bits of tape that are already on the catheter. How to wear a drainage bag You should have:  A large overnight bag.  A small leg bag.  Overnight Bag You may wear the  overnight bag at any time. Always keep the bag below the level of your bladder but off the floor. When you sleep, put a clean plastic bag in a wastebasket. Then hang the bag inside the wastebasket. Leg Bag Never wear the leg bag at night. Always wear the leg bag below your knee. Keep the leg bag secure with a leg strap or tape. How to care for your skin  Clean the skin around the catheter at least once every day.  Shower every day. Do not take baths.  Put creams, lotions, or ointments on your genital area only as told by your doctor.  Do not use powders, sprays, or lotions on your genital area. How to clean your catheter and your skin 1. Wash your hands with soap and water. 2. Wet a washcloth in warm water and gentle (mild) soap. 3. Use the washcloth to clean the skin where the catheter enters your body. Clean downward and wipe away from the catheter in small circles. Do not wipe toward the catheter. 4. Pat the area dry with a clean towel. Make sure to clean off all soap. How to care for your drainage bags Empty your drainage bag when it is ?- full or at least 2-3 times a day. Replace your drainage bag once a month or sooner if it starts to smell bad or look dirty. Do not clean your drainage bag unless told by your doctor. Emptying a drainage bag  Supplies Needed  Rubbing alcohol.  Gauze pad or cotton ball.  Tape or a  leg strap.  Steps 1. Wash your hands with soap and water. 2. Separate (detach) the bag from your leg. 3. Hold the bag over the toilet or a clean container. Keep the bag below your hips and bladder. This stops pee (urine) from going back into the tube. 4. Open the pour spout at the bottom of the bag. 5. Empty the pee into the toilet or container. Do not let the pour spout touch any surface. 6. Put rubbing alcohol on a gauze pad or cotton ball. 7. Use the gauze pad or cotton ball to clean the pour spout. 8. Close the pour spout. 9. Attach the bag to your leg with  tape or a leg strap. 10. Wash your hands.  Changing a drainage bag Supplies Needed  Alcohol wipes.  A clean drainage bag.  Adhesive tape or a leg strap.  Steps 1. Wash your hands with soap and water. 2. Separate the dirty bag from your leg. 3. Pinch the rubber catheter with your fingers so that pee does not spill out. 4. Separate the catheter tube from the drainage tube where these tubes connect (at the connection valve). Do not let the tubes touch any surface. 5. Clean the end of the catheter tube with an alcohol wipe. Use a different alcohol wipe to clean the end of the drainage tube. 6. Connect the catheter tube to the drainage tube of the clean bag. 7. Attach the new bag to the leg with adhesive tape or a leg strap. 8. Wash your hands.  How to prevent infection and other problems  Never pull on your catheter or try to remove it. Pulling can damage tissue in your body.  Always wash your hands before and after touching your catheter.  If a leg strap gets wet, replace it with a dry one.  Drink enough fluids to keep your pee clear or pale yellow, or as told by your doctor.  Do not let the drainage bag or tubing touch the floor.  Wear cotton underwear.  If you are male, wipe from front to back after you poop (have a bowel movement).  Check on the catheter often to make sure it works and the tubing is not twisted. Get help if:  Your pee is cloudy.  Your pee smells unusually bad.  Your pee is not draining into the bag.  Your tube gets clogged.  Your catheter starts to leak.  Your bladder feels full. Get help right away if:  You have redness, swelling, or pain where the catheter enters your body.  You have fluid, pus, or a bad smell coming from the area where the catheter enters your body.  The area where the catheter enters your body feels warm.  You have a fever.  You have pain in your: ? Stomach (abdomen). ? Legs. ? Lower back. ? Bladder.  You see  blood fill the catheter.  Your pee is pink or red.  You feel sick to your stomach (nauseous).  You throw up (vomit).  You have chills.  Your catheter gets pulled out. This information is not intended to replace advice given to you by your health care provider. Make sure you discuss any questions you have with your health care provider. Document Released: 04/16/2012 Document Revised: 11/18/2015 Document Reviewed: 06/04/2013 Elsevier Interactive Patient Education  2018 [employer].   Prostatic Urethral Lift, Care After This sheet gives you information about how to care for yourself after your procedure. Your health care provider may also give  you more specific instructions. If you have problems or questions, contact your health care provider. What can I expect after the procedure? After the procedure, it is common to have:  Discomfort or burning when urinating.  An increased urge to urinate.  More frequent urination.  Urine that is slightly blood-tinged.  These symptoms should go away after a few days. Follow these instructions at home:  Take over-the-counter and prescription medicines only as told by your health care provider.  Do not drive for 24 hours if you were given a medicine to help you relax (sedative).  Do not drive or use heavy machinery while taking prescription pain medicine.  Do not lift anything that is heavier than 10 lb (4.5 kg) until your health care provider says that this is safe.  Return to your normal activities as told by your health care provider. Ask your health care provider what activities are safe for you. Ask when you can return to sexual activity.  Drink enough fluid to keep your urine clear or pale yellow.  Keep all follow-up visits as told by your health care provider. This is important. Contact a health care provider if:  You have chills or a fever.  You have pain when passing urine.  You have bright red blood or blood clots in your  urine.  You have difficulty passing urine.  You have leaking of urine (incontinence). Get help right away if:  You have chest pain or shortness of breath.  You have leg pain or swelling.  You cannot pass urine. Summary  After the procedure, it is common to have discomfort or burning when urinating, an increased urge to urinate, more frequent urination, and urine that is slightly blood-tinged.  Do not drive for 24 hours if you were given a medicine to help you relax (sedative). Do not drive or use heavy machinery while taking prescription pain medicine.  Do not lift anything that is heavier than 10 lb (4.5 kg) until your health care provider says that this is safe.  Return to your normal activities as told by your health care provider. This information is not intended to replace advice given to you by your health care provider. Make sure you discuss any questions you have with your health care provider. Document Released: 02/09/2016 Document Revised: 02/09/2016 Document Reviewed: 02/09/2016 Elsevier Interactive Patient Education  2018 [employer].

## 2017-12-12 NOTE — Anesthesia Preprocedure Evaluation (Signed)
Anesthesia Evaluation  Patient identified by MRN, date of birth, ID band Patient awake    Reviewed: Allergy & Precautions, NPO status , Patient's Chart, lab work & pertinent test results, reviewed documented beta blocker date and time   Airway Mallampati: II  TM Distance: >3 FB Neck ROM: Full    Dental  (+) Teeth Intact, Dental Advisory Given   Pulmonary neg pulmonary ROS,    Pulmonary exam normal breath sounds clear to auscultation       Cardiovascular hypertension, Pt. on home beta blockers + Peripheral Vascular Disease  Normal cardiovascular exam Rhythm:Regular Rate:Normal     Neuro/Psych  Neuromuscular disease negative psych ROS   GI/Hepatic negative GI ROS, Neg liver ROS,   Endo/Other  diabetes, Type 2, Oral Hypoglycemic Agents  Renal/GU negative Renal ROS   BENIGN PROSTATIC HYPERPLASIA, WEAK URINARY STREAM    Musculoskeletal  (+) Arthritis ,   Abdominal   Peds  Hematology negative hematology ROS (+)   Anesthesia Other Findings Day of surgery medications reviewed with the patient.  Reproductive/Obstetrics                             Anesthesia Physical Anesthesia Plan  ASA: III  Anesthesia Plan: General   Post-op Pain Management:    Induction: Intravenous  PONV Risk Score and Plan: 2 and Ondansetron and Midazolam  Airway Management Planned: LMA  Additional Equipment:   Intra-op Plan:   Post-operative Plan: Extubation in OR  Informed Consent: I have reviewed the patients History and Physical, chart, labs and discussed the procedure including the risks, benefits and alternatives for the proposed anesthesia with the patient or authorized representative who has indicated his/her understanding and acceptance.   Dental advisory given  Plan Discussed with: CRNA  Anesthesia Plan Comments:         Anesthesia Quick Evaluation

## 2017-12-12 NOTE — Interval H&P Note (Signed)
History and Physical Interval Note:  12/12/2017 9:29 AM  [name]  has presented today for surgery, with the diagnosis of BENIGN PROSTATIC HYPERPLASIA, WEAK URINARY STREAM  The various methods of treatment have been discussed with the patient and family. After consideration of risks, benefits and other options for treatment, the patient has consented to  Procedure(s): CYSTOSCOPY WITH INSERTION OF UROLIFT (N/A) as a surgical intervention.  The patient's history has been reviewed, patient examined, no change in status, stable for surgery. Discussed post-op care, foley. He has a weak stream (didn't take alpha blocker yesterday or today). No dysuria, fever, cough or congestion.  I have reviewed the patient's chart and labs.  Questions were answered to the patient's satisfaction.  He elects to proceed.    Festus Aloe

## 2017-12-12 NOTE — Anesthesia Procedure Notes (Signed)
Procedure Name: LMA Insertion Date/Time: 12/12/2017 9:48 AM Performed by: Catalina Gravel, MD Pre-anesthesia Checklist: Patient identified, Emergency Drugs available, Suction available and Patient being monitored Patient Re-evaluated:Patient Re-evaluated prior to induction Oxygen Delivery Method: Circle system utilized Preoxygenation: Pre-oxygenation with 100% oxygen Induction Type: IV induction Ventilation: Mask ventilation without difficulty LMA: LMA inserted LMA Size: 4.0 Number of attempts: 1 Airway Equipment and Method: Bite block Placement Confirmation: positive ETCO2 Tube secured with: Tape Dental Injury: Teeth and Oropharynx as per pre-operative assessment

## 2017-12-12 NOTE — Op Note (Signed)
Preoperative diagnosis: BPH with obstructive symptomatology. Postoperative diagnosis: Same  Principal procedure: Urolift procedure, with the placement of 5 implants and 4 remaining.  Surgeon: Junious Silk  Anesthesia: Gen  Complications: None  Drains: 58 French Foley catheter, to leg bag.  Estimated blood loss: Less than 25 mL  Indications: 67 -year-old male with obstructive symptomatology secondary to BPH. The patient's symptoms have progressed, and he has requested further management. Management options including TURP/laser with resection/ablation of the prostate as well as Urolift or continued meds were discussed. The patient has chosen to have a Urolift procedure. He has been instructed to the procedure as well as risks and complications which include but are not limited to infection, bleeding, and inadequate treatment with the Urolift procedure alone, anesthetic complications, among others. He understands these and desires to proceed.  Findings: On exam under anesthesia the penis was circumcised and normal. Testicles palpably normal bilateral and atrophic. On DRE, the prostate was smooth and about 50 g - no hard area or nodule.   Using the 35 French cystoscope, urethra and bladder were inspected. There were no urethral lesions. Prostatic urethra was obstructed secondary to bilobar hypertrophy. The bladder was inspected circumferentially. This revealed normal findings of the mucosa. No stone or foreign body. Moderate trabeculation. Short prostate.   Description of procedure: The patient was properly identified in the holding area. He received preoperative antibiotics. He was taken to the procedure room where monitored anesthesia care was administered. He was placed in the dorsolithotomy position. Genitalia and perineum were prepped and draped. Proper timeout was performed. The 22 Fr cystoscope was passed per urethra and the urethra and bladder inspected.   A 5F cystoscope was inserted into  the bladder. The cystoscopy bridge was replaced with a UroLift delivery device. Due to the short prostate the first treatment site was just proximal to the verumontanum on the patient's left side. The distal tip of the delivery device was then angled laterally approximately 20 degrees at this position to compress the lateral lobe. The trigger was pulled, thereby deploying a needle containing the implant through the prostate. The needle was then retracted, allowing one end of the implant to be delivered to the capsular surface of the prostate. The implant was then tensioned to assure capsular seating and removal of slack monofilament. The device was then angled back toward midline and slowly advanced proximally until cystoscopic verification of the monofilament being centered in the delivery bay. The urethral end piece was then affixed to the monofilament thereby tailoring the size of the implant. Excess filament was then severed. The delivery device was then re-advanced into the bladder. The delivery device was then replaced with cystoscope and bridge and the implant location and opening effect was confirmed cystoscopically. The same procedure was repeated just proximal to the verumontanum on the right side (2). An additional implant was placed on the patient's left side 1.5 - 2 cm distal to the bladder neck (3) and ended up being "stacked" under the first one as expected. The same procedure was then repeated on the right side (4) and despite trying to stack the implant the urethral end piece protruded over the bladder neck and into the bladder. It was was removed intact and the capsular tab also pulled through and was removed. An addition implant was placed on the right about 2 cm from the BN (5) and again was stacked under the first one following the same technique.  A final cystoscopy was conducted first to inspect the location and  state of each implant and second, to confirm the presence of a continuous  channel was present through the prostatic urethra with irrigation flow turned off. 5 Implants were delivered in total with 4 left in place.   Following this, the scope was removed and an 67 French Foley catheter was placed and hooked to dependent drainage. I placed a b&O suppository. He was then awakened and taken to the recovery area in stable condition. He tolerated the procedure well.

## 2017-12-12 NOTE — Anesthesia Postprocedure Evaluation (Signed)
Anesthesia Post Note  Patient: [name]  Procedure(s) Performed: CYSTOSCOPY WITH INSERTION OF UROLIFT (N/A )     Patient location during evaluation: PACU Anesthesia Type: General Level of consciousness: awake and alert, awake and oriented Pain management: pain level controlled Vital Signs Assessment: post-procedure vital signs reviewed and stable Respiratory status: spontaneous breathing, nonlabored ventilation and respiratory function stable Cardiovascular status: blood pressure returned to baseline and stable Postop Assessment: no apparent nausea or vomiting Anesthetic complications: no    Last Vitals:  Vitals:   12/12/17 1115 12/12/17 1205  BP: (!) 150/76 138/70  Pulse: (!) 54   Resp: 11 14  Temp:  36.5 C  SpO2: 100% 98%    Last Pain:  Vitals:   12/12/17 1205  TempSrc: Oral  PainSc: 0-No pain                 Catalina Gravel

## 2017-12-13 ENCOUNTER — Encounter (HOSPITAL_BASED_OUTPATIENT_CLINIC_OR_DEPARTMENT_OTHER): Payer: Self-pay | Admitting: Urology

## 2017-12-15 DIAGNOSIS — R3914 Feeling of incomplete bladder emptying: Secondary | ICD-10-CM | POA: Diagnosis not present

## 2017-12-15 DIAGNOSIS — N401 Enlarged prostate with lower urinary tract symptoms: Secondary | ICD-10-CM | POA: Diagnosis not present

## 2017-12-25 ENCOUNTER — Encounter: Payer: Self-pay | Admitting: Family Medicine

## 2017-12-25 ENCOUNTER — Ambulatory Visit (INDEPENDENT_AMBULATORY_CARE_PROVIDER_SITE_OTHER): Payer: Medicare Other | Admitting: Family Medicine

## 2017-12-25 VITALS — BP 124/78 | HR 55 | Temp 97.8°F | Ht 66.0 in | Wt 165.2 lb

## 2017-12-25 DIAGNOSIS — E1159 Type 2 diabetes mellitus with other circulatory complications: Secondary | ICD-10-CM

## 2017-12-25 DIAGNOSIS — Z0000 Encounter for general adult medical examination without abnormal findings: Secondary | ICD-10-CM

## 2017-12-25 LAB — COMPREHENSIVE METABOLIC PANEL
ALT: 20 U/L (ref 0–53)
AST: 20 U/L (ref 0–37)
Albumin: 4 g/dL (ref 3.5–5.2)
Alkaline Phosphatase: 63 U/L (ref 39–117)
BUN: 16 mg/dL (ref 6–23)
CO2: 29 mEq/L (ref 19–32)
CREATININE: 0.89 mg/dL (ref 0.40–1.50)
Calcium: 9.3 mg/dL (ref 8.4–10.5)
Chloride: 101 mEq/L (ref 96–112)
GFR: 109.91 mL/min (ref >60.00)
Glucose, Bld: 142 mg/dL — ABNORMAL HIGH (ref 70–99)
Potassium: 4.4 mEq/L (ref 3.5–5.1)
Sodium: 140 mEq/L (ref 135–145)
Total Bilirubin: 0.4 mg/dL (ref 0.2–1.2)
Total Protein: 6.2 g/dL (ref 6.0–8.3)

## 2017-12-25 LAB — LIPID PANEL
Cholesterol: 128 mg/dL (ref 0–200)
HDL: 41.2 mg/dL (ref >39.00)
LDL Cholesterol: 76 mg/dL (ref 0–99)
NonHDL: 86.86
TRIGLYCERIDES: 56 mg/dL (ref 0.0–149.0)
Total CHOL/HDL Ratio: 3
VLDL: 11.2 mg/dL (ref 0.0–40.0)

## 2017-12-25 NOTE — Patient Instructions (Signed)
Give [place] 2-3 business days to get the results of your labs back.   Keep the diet clean and stay active.  Heat (pad or rice pillow in microwave) over affected area, 10-15 minutes twice daily.    Biceps Tendon Disruption (Proximal) Rehab Do exercises exactly as told by your health care provider and adjust them as directed. It is normal to feel mild stretching, pulling, tightness, or discomfort as you do these exercises, but you should stop right away if you feel sudden pain or your pain gets worse. Stretching and range of motion exercises These exercises warm up your muscles and joints and improve the movement and flexibility of your arm and shoulder. These exercises also help to relieve pain and stiffness. Exercise A: Shoulder flexion, standing   1. Stand facing a wall. Put your left / right hand on the wall. 2. Slide your left / right hand up the wall. Stop when you feel a stretch in your shoulder, or when you reach the angle recommended by your health care provider.  Use your other hand to help raise your arm, if needed.  As your hand gets higher, you may need to step closer to the wall.  Avoid shrugging your shoulder while you raise your arm. To do this, keep your shoulder blade tucked down toward your spine. 3. Hold for 30 seconds. 4. Slowly return to the starting position. Use your other arm to help, if needed. Repeat 2 times. Complete this exercise 3 times per week. Exercise B: Pendulum   1. Stand near a wall or a surface that you can hold onto for balance. 2. Bend at the waist and let your left / right arm hang straight down. Use your other arm to support you. 3. Relax your arm and shoulder muscles, and move your hips and your trunk so your left / right arm swings freely. Your arm should swing because of the motion of your body, not because you are using your arm or shoulder muscles. 4. Keep moving so your arm swings in the following directions, as told by your health care  provider:  Side to side.  Forward and backward.  In clockwise and counterclockwise circles. 5. Slowly return to the starting position. Repeat 2 times. Complete this exercise 3 times per week.  Strengthening exercises These exercises build strength and endurance in your arm and shoulder. Endurance is the ability to use your muscles for a long time, even after your muscles get tired. Exercise C: Elbow flexion, neutral  1. Sit on a stable chair without armrests, or stand. 2. Hold a 3-5 lb weight in your left / right hand, or hold an exercise band with both hands. Your palms should face each other at the starting position. 3. Bend your left / right elbow and move your hand up toward your shoulder.  Lead with your thumb, and keep your palm facing the same direction.  Keep your other arm straight down, in the starting position. 4. Slowly return to the starting position. Repeat 2-3 times. Complete this exercise 3 times per week. Exercise D: Forearm supination   1. Sit with your left / right forearm on a table. Your elbow should be below shoulder height. Rest your hand over the edge of the table so your palm faces down. 2. If directed, hold a hammer with your left / right hand. 3. Without moving your elbow, slowly rotate your hand so your palm faces up toward the ceiling.  If you are holding a hammer,  begin by holding the hammer near the head. When this exercise gets easier for you, hold the hammer farther down the handle. 4. Hold for 3 seconds. 5. Slowly return to the starting position. Repeat 2 times. Complete this exercise 3 times per week. Exercise E: Scapular retraction   1. Sit in a stable chair without armrests, or stand. 2. Secure an exercise band to a stable object in front of you so the band is at shoulder height. 3. Hold one end of the exercise band in each hand. 4. Squeeze your shoulder blades together and move your elbows slightly behind you. Do not shrug your  shoulders. 5. Hold for 3 seconds. 6. Slowly return to the starting position. Repeat 2 times. Complete this exercise 3 times per week. Exercise F: Scapular protraction, supine   1. Lie on your back on a firm surface. Hold a 3-5 lb weight in your left / right hand. 2. Raise your left / right arm straight into the air so your hand is directly above your shoulder joint. 3. Push the weight into the air so your shoulder lifts off of the surface that you are lying on. Do not move your head, neck, or back. 4. Hold for 3 seconds. 5. Slowly return to the starting position. Let your muscles relax completely before you repeat this exercise. Repeat 2 times. Complete this exercise 3 times per week. This information is not intended to replace advice given to you by your health care provider. Make sure you discuss any questions you have with your health care provider. Document Released: 12/20/2004 Document Revised: 08/27/2015 Document Reviewed: 11/28/2014 Elsevier Interactive Patient Education  2017 [employer].

## 2017-12-25 NOTE — Progress Notes (Signed)
Pre visit review using our clinic review tool, if applicable. No additional management support is needed unless otherwise documented below in the visit note. 

## 2017-12-25 NOTE — Progress Notes (Addendum)
Chief Complaint  Patient presents with  . Annual Exam    Subjective: Pt here for initial Welcome to Medicare Evaluation.  Care team: Shelda Pal, DO- PCP Zenovia Jarred, MD- GI Lowella Grip, MD- Allergist  Past Medical History:  Diagnosis Date  . Arthritis    right shoulder  . BPH (benign prostatic hyperplasia)   . Hyperlipidemia   . Hypertension   . Impotence of organic origin   . Iron deficiency anemia   . Mild atherosclerosis of carotid artery, bilateral    last doppler 12-17-2010   bilateral ICA 1-39%  . Nocturia more than twice per night   . Testicular hypofunction   . Type 2 diabetes mellitus [facility])    endocrinologist-- dr Loanne Drilling  . Weak urinary stream   . Wears glasses    Family History  Problem Relation Age of Onset  . Heart disease Mother 77       Deceased  . Heart attack Mother   . Hypertension Mother   . Diabetes Father 48       Deceased  . Hypertension Father   . Heart disease Sister        heart trouble  . Diabetes Brother   . Cancer Paternal Aunt        x1-Bone  . Cancer Paternal Aunt        multiple  . Heart attack Maternal Grandmother   . Healthy Sister        x6  . Colon cancer Neg Hx    Past Surgical History:  Procedure Laterality Date  . CYSTOSCOPY WITH INSERTION OF UROLIFT N/A 12/12/2017   Procedure: CYSTOSCOPY WITH INSERTION OF UROLIFT;  Surgeon: Festus Aloe, MD;  Location: [facility];  Service: Urology;  Laterality: N/A;  . SHOULDER SURGERY Right 2004, 1990  . WISDOM TOOTH EXTRACTION     Current Outpatient Medications on File Prior to Visit  Medication Sig Dispense Refill  . acarbose (PRECOSE) 25 MG tablet Take 1 tablet (25 mg total) by mouth 3 (three) times daily with meals. (Patient taking differently: Take 25 mg by mouth 2 (two) times daily. ) 270 tablet 11  . acyclovir (ZOVIRAX) 800 MG tablet Take 800 mg by mouth daily as needed.     [name] aspirin 81 MG tablet Take 81 mg by mouth daily.      [name]  atenolol (TENORMIN) 50 MG tablet TAKE 1 TABLET DAILY (Patient taking differently: Take 50 mg by mouth every morning. ) 90 tablet 3  . Blood Glucose Monitoring Suppl (FREESTYLE FREEDOM LITE) w/Device KIT 1 Device by Does not apply route daily. 1 each 0  . bromocriptine (PARLODEL) 2.5 MG tablet TAKE 1 TABLET AT BEDTIME (Patient taking differently: Take 2.5 mg by mouth every morning. ) 90 tablet 4  . clotrimazole-betamethasone (LOTRISONE) cream Apply 1 application topically at bedtime. To feet (Patient taking differently: Apply 1 application topically daily as needed. To feet) 45 g 3  . empagliflozin (JARDIANCE) 25 MG TABS tablet Take 25 mg by mouth daily. (Patient taking differently: Take 25 mg by mouth every morning. ) 90 tablet 2  . glucose blood (FREESTYLE LITE) test strip Used to check blood sugars daily. 100 each 12  . hydrochlorothiazide (HYDRODIURIL) 25 MG tablet Take 25 mg by mouth every morning.     . IRON PO Take 1 tablet by mouth daily. Prescribed bid, taking qd    . metFORMIN (GLUCOPHAGE-XR) 500 MG 24 hr tablet TAKE 2 TABLETS TWICE A DAY (Patient  taking differently: Take 1,000 mg by mouth 2 (two) times daily. ) 360 tablet 4  . Multiple Vitamin (MULTIVITAMIN) tablet Take 1 tablet by mouth daily.      . repaglinide (PRANDIN) 2 MG tablet Take 1 tablet (2 mg total) by mouth 2 (two) times daily before a meal. (Patient taking differently: Take 2 mg by mouth 2 (two) times daily before a meal. ) 180 tablet 3  . rosuvastatin (CRESTOR) 10 MG tablet TAKE 1 TABLET(10 MG) BY MOUTH DAILY (Patient taking differently: Take 10 mg by mouth every morning. TAKE 1 TABLET(10 MG) BY MOUTH DAILY) 90 tablet 2  . sildenafil (VIAGRA) 100 MG tablet Take 100 mg by mouth daily as needed. Use as directed     . tamsulosin (FLOMAX) 0.4 MG CAPS capsule Take 1 capsule (0.4 mg total) by mouth daily after supper. 14 capsule 0   Allergies  Allergen Reactions  . Actos [Pioglitazone] Swelling    edema  . Trandolapril Swelling      w/ Angioedema  . Azithromycin Rash  . Other Rash    MAGIC MOUTHWASH: CAUSES RASH  . Tetracycline Rash    Females: Smoking, alcohol/drugs, sunscreen  Mental Health/Substance abuse evaluation: Yes  PHQ-2:  Feelings of depression? No  Loss of satisfaction/pleasure in doing things? No   Fall Risk: Less than 2 falls within the past 12 months? Yes  Mindful of grabbing bars in bathroom, ruffles in rugs, poorly lit areas, handrails on the stairs? Yes   Discussion of functional ability done: Encouraged to maintain physical activity and flexibility. Live alone? Yes  Need help with the following? Bathing: No  Managing money: No  Taking medications: No  Telephone use: No  Transportation: No  Shopping: No  Preparing meals: No   Hearing/Vision screen: Trouble hearing TV or radio when others do not? No  Straining or struggling to hear/understand conversation? No  Concerns with vision? No  Fire safety: Have a working smoke alarm? Yes   Diet Balanced diet? Yes  3 meals daily? no, does 2 meals a day Assistance needed? No   BP 124/78 (BP Location: Left Arm, Patient Position: Sitting, Cuff Size: Normal)   Pulse (!) 55   Temp 97.8 F (36.6 C) (Oral)   Ht _0  (1.676 m)   Wt 165 lb 4 oz (75 kg)   SpO2 97%   BMI 26.67 kg/m   End of life care planning/counseling: Does patient wish to discuss end of life care/planning? Not currently, OK for information Does the patient have an advanced directive? No  Patient code status/living will: Full code Forms given? Yes   Assessment and Plan Medicare welcome exam  Type 2 diabetes mellitus with other circulatory complication, without long-term current use of insulin ([place]) - Plan: Comprehensive metabolic panel, Lipid panel Colonoscopy-UTD Immunizations UTD Counseled on diet and exercise. F/u in 1 year for AWV w Glenard Haring and HTN ck w me. The patient voiced understanding and agreement to the plan.  [place],  DO 12/25/17 8:03 AM

## 2017-12-29 ENCOUNTER — Other Ambulatory Visit: Payer: Self-pay | Admitting: Endocrinology

## 2018-01-19 DIAGNOSIS — R351 Nocturia: Secondary | ICD-10-CM | POA: Diagnosis not present

## 2018-01-19 DIAGNOSIS — N401 Enlarged prostate with lower urinary tract symptoms: Secondary | ICD-10-CM | POA: Diagnosis not present

## 2018-01-23 ENCOUNTER — Ambulatory Visit (INDEPENDENT_AMBULATORY_CARE_PROVIDER_SITE_OTHER): Payer: Medicare Other | Admitting: Endocrinology

## 2018-01-23 ENCOUNTER — Encounter: Payer: Self-pay | Admitting: Endocrinology

## 2018-01-23 VITALS — BP 110/70 | HR 60 | Ht 66.0 in | Wt 163.4 lb

## 2018-01-23 DIAGNOSIS — E1159 Type 2 diabetes mellitus with other circulatory complications: Secondary | ICD-10-CM | POA: Diagnosis not present

## 2018-01-23 LAB — POCT GLYCOSYLATED HEMOGLOBIN (HGB A1C): Hemoglobin A1C: 6.9 % — AB (ref 4.0–5.6)

## 2018-01-23 NOTE — Progress Notes (Signed)
Subjective:    Patient ID: [name], male    DOB: [birth year], 68 y.o.   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  Dx'ed: 7543 Complications: cerebrovascular disease.  Therapy: 5 oral meds. DKA: never.  Severe hypoglycemia: never.  Pancreatitis: never. Other: he can't take pioglitizone, due to edema; he has never taken insulin, but he has learned how.  Interval history: he says cbg's are well-controlled. pt states he feels well in general.  He stopped taking tradjenta.   Past Medical History:  Diagnosis Date  . Arthritis    right shoulder  . BPH (benign prostatic hyperplasia)   . Hyperlipidemia   . Hypertension   . Impotence of organic origin   . Iron deficiency anemia   . Mild atherosclerosis of carotid artery, bilateral    last doppler 12-17-2010   bilateral ICA 1-39%  . Nocturia more than twice per night   . Testicular hypofunction   . Type 2 diabetes mellitus [facility])    endocrinologist-- dr Loanne Drilling  . Weak urinary stream   . Wears glasses     Past Surgical History:  Procedure Laterality Date  . CYSTOSCOPY WITH INSERTION OF UROLIFT N/A 12/12/2017   Procedure: CYSTOSCOPY WITH INSERTION OF UROLIFT;  Surgeon: Festus Aloe, MD;  Location: [facility];  Service: Urology;  Laterality: N/A;  . SHOULDER SURGERY Right 2004, 1990  . WISDOM TOOTH EXTRACTION      Social History   Socioeconomic History  . Marital status: Married    Spouse name: [name]  . Number of children: 0  . Years of education: Not on file  . Highest education level: Not on file  Occupational History    Employer: [employer]  Social Needs  . Financial resource strain: Not on file  . Food insecurity:    Worry: Not on file    Inability: Not on file  . Transportation needs:    Medical: Not on file    Non-medical: Not on file  Tobacco Use  . Smoking status:  Never Smoker  . Smokeless tobacco: Never Used  Substance and Sexual Activity  . Alcohol use: Not Currently    Alcohol/week: 0.0 standard drinks  . Drug use: No  . Sexual activity: Yes  Lifestyle  . Physical activity:    Days per week: Not on file    Minutes per session: Not on file  . Stress: Not on file  Relationships  . Social connections:    Talks on phone: Not on file    Gets together: Not on file    Attends religious service: Not on file    Active member of club or organization: Not on file    Attends meetings of clubs or organizations: Not on file    Relationship status: Not on file  . Intimate partner violence:    Fear of current or ex partner: Not on file    Emotionally abused: Not on file    Physically abused: Not on file    Forced sexual activity: Not on file  Other Topics Concern  . Not on file  Social History Narrative   Regular exercise-yes   Caffeine-2 cups   Married=wife [name] 22 years   Cigar Smoker-quit smoking Jan 2010    Current Outpatient Medications on File Prior to Visit  Medication Sig Dispense Refill  . acarbose (PRECOSE) 25 MG tablet  Take 1 tablet (25 mg total) by mouth 3 (three) times daily with meals. (Patient taking differently: Take 25 mg by mouth 2 (two) times daily. ) 270 tablet 11  . acyclovir (ZOVIRAX) 800 MG tablet Take 800 mg by mouth daily as needed.     [name] aspirin 81 MG tablet Take 81 mg by mouth daily.      [name] atenolol (TENORMIN) 50 MG tablet TAKE 1 TABLET DAILY (Patient taking differently: Take 50 mg by mouth every morning. ) 90 tablet 3  . Blood Glucose Monitoring Suppl (FREESTYLE FREEDOM LITE) w/Device KIT 1 Device by Does not apply route daily. 1 each 0  . bromocriptine (PARLODEL) 2.5 MG tablet TAKE 1 TABLET AT BEDTIME (Patient taking differently: Take 2.5 mg by mouth every evening. ) 90 tablet 4  . clotrimazole-betamethasone (LOTRISONE) cream Apply 1 application topically at bedtime. To feet (Patient taking differently: Apply 1  application topically daily as needed. To feet) 45 g 3  . glucose blood (FREESTYLE LITE) test strip Used to check blood sugars daily. 100 each 12  . hydrochlorothiazide (HYDRODIURIL) 25 MG tablet Take 25 mg by mouth every morning.     . IRON PO Take 1 tablet by mouth daily. Prescribed bid, taking qd    . JARDIANCE 25 MG TABS tablet TAKE 1 TABLET DAILY 90 tablet 4  . metFORMIN (GLUCOPHAGE-XR) 500 MG 24 hr tablet TAKE 2 TABLETS TWICE A DAY (Patient taking differently: Take 1,000 mg by mouth 2 (two) times daily. ) 360 tablet 4  . Multiple Vitamin (MULTIVITAMIN) tablet Take 1 tablet by mouth daily.      . repaglinide (PRANDIN) 2 MG tablet Take 1 tablet (2 mg total) by mouth 2 (two) times daily before a meal. (Patient taking differently: Take 2 mg by mouth 2 (two) times daily before a meal. ) 180 tablet 3  . rosuvastatin (CRESTOR) 10 MG tablet TAKE 1 TABLET(10 MG) BY MOUTH DAILY (Patient taking differently: Take 10 mg by mouth every morning. TAKE 1 TABLET(10 MG) BY MOUTH DAILY) 90 tablet 2  . sildenafil (VIAGRA) 100 MG tablet Take 100 mg by mouth daily as needed. Use as directed      No current facility-administered medications on file prior to visit.     Allergies  Allergen Reactions  . Actos [Pioglitazone] Swelling    edema  . Trandolapril Swelling     w/ Angioedema  . Azithromycin Rash  . Other Rash    MAGIC MOUTHWASH: CAUSES RASH  . Tetracycline Rash    Family History  Problem Relation Age of Onset  . Heart disease Mother 15       Deceased  . Heart attack Mother   . Hypertension Mother   . Diabetes Father 79       Deceased  . Hypertension Father   . Heart disease Sister        heart trouble  . Diabetes Brother   . Cancer Paternal Aunt        x1-Bone  . Cancer Paternal Aunt        multiple  . Heart attack Maternal Grandmother   . Healthy Sister        x6  . Colon cancer Neg Hx     BP 110/70 (BP Location: Left Arm, Patient Position: Sitting, Cuff Size: Normal)   Pulse 60    Ht _0  (1.676 m)   Wt 163 lb 6.4 oz (74.1 kg)   SpO2 96%   BMI 26.37 kg/m  Review of Systems He has lost a few lbs.      Objective:   Physical Exam VITAL SIGNS:  See vs page GENERAL: no distress Pulses: dorsalis pedis intact bilat.   MSK: no deformity of the feet CV: no leg edema Skin:  no ulcer on the feet.  normal color and temp on the feet. Neuro: sensation is intact to touch on the feet  Lab Results  Component Value Date   HGBA1C 6.9 (A) 01/23/2018       Assessment & Plan:  Type 2 DM, with CVD: well-controlled. Edema, by hx: this limits rx options.   Patient Instructions  Please continue the same medications Please come back for a follow-up appointment in 4 months.  check your blood sugar once a day.  vary the time of day when you check, between before the 3 meals, and at bedtime.  also check if you have symptoms of your blood sugar being too high or too low.  please keep a record of the readings and bring it to your next appointment here (or you can bring the meter itself).  You can write it on any piece of paper.  please call us sooner if your blood sugar goes below 70, or if you have a lot of readings over 200.

## 2018-01-23 NOTE — Patient Instructions (Signed)
Please continue the same medications Please come back for a follow-up appointment in 4 months.  check your blood sugar once a day.  vary the time of day when you check, between before the 3 meals, and at bedtime.  also check if you have symptoms of your blood sugar being too high or too low.  please keep a record of the readings and bring it to your next appointment here (or you can bring the meter itself).  You can write it on any piece of paper.  please call us sooner if your blood sugar goes below 70, or if you have a lot of readings over 200.

## 2018-02-22 DIAGNOSIS — H2513 Age-related nuclear cataract, bilateral: Secondary | ICD-10-CM | POA: Diagnosis not present

## 2018-02-22 DIAGNOSIS — E119 Type 2 diabetes mellitus without complications: Secondary | ICD-10-CM | POA: Diagnosis not present

## 2018-02-27 ENCOUNTER — Ambulatory Visit: Payer: Medicare Other | Admitting: *Deleted

## 2018-04-16 ENCOUNTER — Telehealth: Payer: Self-pay | Admitting: *Deleted

## 2018-04-16 ENCOUNTER — Encounter: Payer: Self-pay | Admitting: Family Medicine

## 2018-04-16 ENCOUNTER — Other Ambulatory Visit: Payer: Self-pay

## 2018-04-16 ENCOUNTER — Ambulatory Visit (INDEPENDENT_AMBULATORY_CARE_PROVIDER_SITE_OTHER): Payer: Medicare Other | Admitting: Family Medicine

## 2018-04-16 DIAGNOSIS — L309 Dermatitis, unspecified: Secondary | ICD-10-CM

## 2018-04-16 MED ORDER — TRIAMCINOLONE ACETONIDE 0.5 % EX CREA: 1.0000 "application " | TOPICAL_CREAM | Freq: Three times a day (TID) | CUTANEOUS | 0 refills | Status: AC

## 2018-04-16 NOTE — Telephone Encounter (Signed)
Schedule virtual appt today.

## 2018-04-16 NOTE — Progress Notes (Addendum)
Virtual Visit via Video Note  I connected with [name] on 04/16/18 at  1:15 PM EDT by a video enabled telemedicine application and verified that I am speaking with the correct person using two identifiers.   I discussed the limitations of evaluation and management by telemedicine and the availability of in person appointments. The patient expressed understanding and agreed to proceed.  History of Present Illness: Has had an itchy rash on his R side for 4 days. Has appt with derm on Th in 3 d. There was a mole like structure that arose and fell off leaving redness and lots of itching.  Tried topical steroid which helps for short periods of time. No new topicals, sick contacts, fevers, new meds/foods.    Observations/Objective: No conversational dyspnea Age appropriate judgment and insight Nml affect and mood  Assessment and Plan: Dermatitis - Plan: triamcinolone cream (KENALOG) 0.5 %  Orders as above. Emollient prn. Keep appt with derm. Try not to itch. Cont loratadine.   Follow Up Instructions: PRN   I discussed the assessment and treatment plan with the patient. The patient was provided an opportunity to ask questions and all were answered. The patient agreed with the plan and demonstrated an understanding of the instructions.   The patient was advised to call back or seek an in-person evaluation if the symptoms worsen or if the condition fails to improve as anticipated.   [place], DO

## 2018-04-16 NOTE — Telephone Encounter (Signed)
Copied from [place] [phone]. Topic: Appointment Scheduling - Scheduling Inquiry for Clinic >> Apr 16, 2018  8:15 AM Oneta Rack wrote: Relation to pt: self  Call back number: [phone]  Reason for call:  Rash located on right side of  chest for 1 week, progressively getting worst. Patient has an appointment with dermatology  on Thursday, WEB EX is option for patient but unsure if necessary if PCP will not do a face to face appointment, please advise

## 2018-04-16 NOTE — Telephone Encounter (Signed)
Happy to see him or have him do E visit if he does not want to wait until Th. Up to him. Ty.

## 2018-04-19 DIAGNOSIS — L239 Allergic contact dermatitis, unspecified cause: Secondary | ICD-10-CM | POA: Diagnosis not present

## 2018-04-20 DIAGNOSIS — N401 Enlarged prostate with lower urinary tract symptoms: Secondary | ICD-10-CM | POA: Diagnosis not present

## 2018-04-20 DIAGNOSIS — R948 Abnormal results of function studies of other organs and systems: Secondary | ICD-10-CM | POA: Diagnosis not present

## 2018-04-20 DIAGNOSIS — R351 Nocturia: Secondary | ICD-10-CM | POA: Diagnosis not present

## 2018-04-20 DIAGNOSIS — E291 Testicular hypofunction: Secondary | ICD-10-CM | POA: Diagnosis not present

## 2018-04-25 ENCOUNTER — Telehealth: Payer: Self-pay | Admitting: Family Medicine

## 2018-04-25 NOTE — Telephone Encounter (Signed)
Rec'd from [facility] forwarded 5 pages to Dr. Nani Ravens.

## 2018-05-04 ENCOUNTER — Other Ambulatory Visit: Payer: Self-pay | Admitting: Family Medicine

## 2018-05-04 DIAGNOSIS — D485 Neoplasm of uncertain behavior of skin: Secondary | ICD-10-CM | POA: Diagnosis not present

## 2018-05-04 DIAGNOSIS — L309 Dermatitis, unspecified: Secondary | ICD-10-CM | POA: Diagnosis not present

## 2018-05-11 DIAGNOSIS — Z20828 Contact with and (suspected) exposure to other viral communicable diseases: Secondary | ICD-10-CM | POA: Diagnosis not present

## 2018-05-14 ENCOUNTER — Encounter: Payer: Self-pay | Admitting: Endocrinology

## 2018-05-14 ENCOUNTER — Other Ambulatory Visit: Payer: Self-pay

## 2018-05-14 ENCOUNTER — Ambulatory Visit (INDEPENDENT_AMBULATORY_CARE_PROVIDER_SITE_OTHER): Payer: Medicare Other | Admitting: Endocrinology

## 2018-05-14 VITALS — BP 136/70 | HR 69 | Ht 66.0 in | Wt 165.6 lb

## 2018-05-14 DIAGNOSIS — Z9119 Patient's noncompliance with other medical treatment and regimen: Secondary | ICD-10-CM | POA: Diagnosis not present

## 2018-05-14 DIAGNOSIS — E1159 Type 2 diabetes mellitus with other circulatory complications: Secondary | ICD-10-CM

## 2018-05-14 LAB — POCT GLYCOSYLATED HEMOGLOBIN (HGB A1C): Hemoglobin A1C: 7.5 % — AB (ref 4.0–5.6)

## 2018-05-14 NOTE — Progress Notes (Signed)
Subjective:    Patient ID: [name], male    DOB: July 05, 1950, 68 y.o.   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  Dx'ed: 8366 Complications: cerebrovascular disease.  Therapy: 5 oral meds. DKA: never.  Severe hypoglycemia: never.  Pancreatitis: never. Other: he can't take pioglitizone, due to edema; he has never taken insulin, but he has learned how.  Interval history: he does not check cbg's. pt states he feels well in general.  Hr takes meds as rx'ed, except he often forgets the repaglinide.   Past Medical History:  Diagnosis Date  . Arthritis    right shoulder  . BPH (benign prostatic hyperplasia)   . Hyperlipidemia   . Hypertension   . Impotence of organic origin   . Iron deficiency anemia   . Mild atherosclerosis of carotid artery, bilateral    last doppler 12-17-2010   bilateral ICA 1-39%  . Nocturia more than twice per night   . Testicular hypofunction   . Type 2 diabetes mellitus [facility])    endocrinologist-- dr Loanne Drilling  . Weak urinary stream   . Wears glasses     Past Surgical History:  Procedure Laterality Date  . CYSTOSCOPY WITH INSERTION OF UROLIFT N/A 12/12/2017   Procedure: CYSTOSCOPY WITH INSERTION OF UROLIFT;  Surgeon: Festus Aloe, MD;  Location: [facility];  Service: Urology;  Laterality: N/A;  . SHOULDER SURGERY Right 2004, 1990  . WISDOM TOOTH EXTRACTION      Social History   Socioeconomic History  . Marital status: Married    Spouse name: [name]  . Number of children: 0  . Years of education: Not on file  . Highest education level: Not on file  Occupational History    Employer: [employer]  Social Needs  . Financial resource strain: Not on file  . Food insecurity:    Worry: Not on file    Inability: Not on file  . Transportation needs:    Medical: Not on file    Non-medical: Not on file  Tobacco  Use  . Smoking status: Never Smoker  . Smokeless tobacco: Never Used  Substance and Sexual Activity  . Alcohol use: Not Currently    Alcohol/week: 0.0 standard drinks  . Drug use: No  . Sexual activity: Yes  Lifestyle  . Physical activity:    Days per week: Not on file    Minutes per session: Not on file  . Stress: Not on file  Relationships  . Social connections:    Talks on phone: Not on file    Gets together: Not on file    Attends religious service: Not on file    Active member of club or organization: Not on file    Attends meetings of clubs or organizations: Not on file    Relationship status: Not on file  . Intimate partner violence:    Fear of current or ex partner: Not on file    Emotionally abused: Not on file    Physically abused: Not on file    Forced sexual activity: Not on file  Other Topics Concern  . Not on file  Social History Narrative   Regular exercise-yes   Caffeine-2 cups   Married=wife [name] 22 years   Cigar Smoker-quit smoking Jan 2010    Current Outpatient Medications on File Prior to Visit  Medication Sig Dispense Refill  .  acarbose (PRECOSE) 25 MG tablet Take 1 tablet (25 mg total) by mouth 3 (three) times daily with meals. (Patient taking differently: Take 25 mg by mouth 2 (two) times daily. ) 270 tablet 11  . acyclovir (ZOVIRAX) 800 MG tablet Take 800 mg by mouth daily as needed.     [name] aspirin 81 MG tablet Take 81 mg by mouth daily.      [name] atenolol (TENORMIN) 50 MG tablet TAKE 1 TABLET DAILY 90 tablet 3  . Blood Glucose Monitoring Suppl (FREESTYLE FREEDOM LITE) w/Device KIT 1 Device by Does not apply route daily. 1 each 0  . bromocriptine (PARLODEL) 2.5 MG tablet TAKE 1 TABLET AT BEDTIME (Patient taking differently: Take 2.5 mg by mouth every evening. ) 90 tablet 4  . clotrimazole-betamethasone (LOTRISONE) cream Apply 1 application topically at bedtime. To feet (Patient taking differently: Apply 1 application topically daily as needed. To feet)  45 g 3  . glucose blood (FREESTYLE LITE) test strip Used to check blood sugars daily. 100 each 12  . hydrochlorothiazide (HYDRODIURIL) 25 MG tablet Take 25 mg by mouth every morning.     . IRON PO Take 1 tablet by mouth daily. Prescribed bid, taking qd    . JARDIANCE 25 MG TABS tablet TAKE 1 TABLET DAILY 90 tablet 4  . metFORMIN (GLUCOPHAGE-XR) 500 MG 24 hr tablet TAKE 2 TABLETS TWICE A DAY (Patient taking differently: Take 1,000 mg by mouth 2 (two) times daily. ) 360 tablet 4  . Multiple Vitamin (MULTIVITAMIN) tablet Take 1 tablet by mouth daily.      . repaglinide (PRANDIN) 2 MG tablet Take 1 tablet (2 mg total) by mouth 2 (two) times daily before a meal. (Patient taking differently: Take 2 mg by mouth 2 (two) times daily before a meal. ) 180 tablet 3  . rosuvastatin (CRESTOR) 10 MG tablet TAKE 1 TABLET(10 MG) BY MOUTH DAILY (Patient taking differently: Take 10 mg by mouth every morning. TAKE 1 TABLET(10 MG) BY MOUTH DAILY) 90 tablet 2  . sildenafil (VIAGRA) 100 MG tablet Take 100 mg by mouth daily as needed. Use as directed      No current facility-administered medications on file prior to visit.     Allergies  Allergen Reactions  . Actos [Pioglitazone] Swelling    edema  . Trandolapril Swelling     w/ Angioedema  . Azithromycin Rash  . Other Rash    MAGIC MOUTHWASH: CAUSES RASH  . Tetracycline Rash    Family History  Problem Relation Age of Onset  . Heart disease Mother 73       Deceased  . Heart attack Mother   . Hypertension Mother   . Diabetes Father 80       Deceased  . Hypertension Father   . Heart disease Sister        heart trouble  . Diabetes Brother   . Cancer Paternal Aunt        x1-Bone  . Cancer Paternal Aunt        multiple  . Heart attack Maternal Grandmother   . Healthy Sister        x6  . Colon cancer Neg Hx     BP 136/70 (BP Location: Left Arm, Patient Position: Sitting, Cuff Size: Normal)   Pulse 69   Ht 5' 6"  (1.676 m)   Wt 165 lb 9.6 oz (75.1  kg)   SpO2 95%   BMI 26.73 kg/m    Review of Systems He denies  hypoglycemia    Objective:   Physical Exam VITAL SIGNS:  See vs page GENERAL: no distress Pulses: dorsalis pedis intact bilat.   MSK: no deformity of the feet CV: no leg edema Skin:  no ulcer on the feet.  normal color and temp on the feet. Neuro: sensation is intact to touch on the feet.    A1c=7.5%     Assessment & Plan:  Type 2 DM: worse. Noncompliance with cbg's and meds.  We discussed.    Patient Instructions  With your a1c, you should resume the repaglinide. Please continue the same other diabetes medications. check your blood sugar once a day.  vary the time of day when you check, between before the 3 meals, and at bedtime.  also check if you have symptoms of your blood sugar being too high or too low.  please keep a record of the readings and bring it to your next appointment here (or you can bring the meter itself).  You can write it on any piece of paper.  please call us sooner if your blood sugar goes below 70, or if you have a lot of readings over 200. Please come back for a follow-up appointment in 3 months.

## 2018-05-14 NOTE — Patient Instructions (Signed)
With your a1c, you should resume the repaglinide. Please continue the same other diabetes medications. check your blood sugar once a day.  vary the time of day when you check, between before the 3 meals, and at bedtime.  also check if you have symptoms of your blood sugar being too high or too low.  please keep a record of the readings and bring it to your next appointment here (or you can bring the meter itself).  You can write it on any piece of paper.  please call us sooner if your blood sugar goes below 70, or if you have a lot of readings over 200. Please come back for a follow-up appointment in 3 months.

## 2018-05-24 DIAGNOSIS — S30861D Insect bite (nonvenomous) of abdominal wall, subsequent encounter: Secondary | ICD-10-CM | POA: Diagnosis not present

## 2018-06-21 DIAGNOSIS — S30861D Insect bite (nonvenomous) of abdominal wall, subsequent encounter: Secondary | ICD-10-CM | POA: Diagnosis not present

## 2018-07-04 ENCOUNTER — Other Ambulatory Visit: Payer: Self-pay | Admitting: Family Medicine

## 2018-07-04 DIAGNOSIS — E1159 Type 2 diabetes mellitus with other circulatory complications: Secondary | ICD-10-CM

## 2018-08-06 DIAGNOSIS — L819 Disorder of pigmentation, unspecified: Secondary | ICD-10-CM | POA: Diagnosis not present

## 2018-08-06 DIAGNOSIS — L821 Other seborrheic keratosis: Secondary | ICD-10-CM | POA: Diagnosis not present

## 2018-08-06 DIAGNOSIS — S30861D Insect bite (nonvenomous) of abdominal wall, subsequent encounter: Secondary | ICD-10-CM | POA: Diagnosis not present

## 2018-08-06 LAB — HM DIABETES EYE EXAM

## 2018-08-16 ENCOUNTER — Other Ambulatory Visit: Payer: Self-pay

## 2018-08-20 ENCOUNTER — Encounter: Payer: Self-pay | Admitting: Endocrinology

## 2018-08-20 ENCOUNTER — Ambulatory Visit (INDEPENDENT_AMBULATORY_CARE_PROVIDER_SITE_OTHER): Payer: Medicare Other | Admitting: Endocrinology

## 2018-08-20 ENCOUNTER — Other Ambulatory Visit: Payer: Self-pay

## 2018-08-20 VITALS — BP 128/78 | HR 70 | Ht 66.0 in | Wt 164.8 lb

## 2018-08-20 DIAGNOSIS — E1159 Type 2 diabetes mellitus with other circulatory complications: Secondary | ICD-10-CM

## 2018-08-20 LAB — POCT GLYCOSYLATED HEMOGLOBIN (HGB A1C): Hemoglobin A1C: 7.5 % — AB (ref 4.0–5.6)

## 2018-08-20 MED ORDER — ACARBOSE 25 MG PO TABS: 25.0000 mg | ORAL_TABLET | Freq: Three times a day (TID) | ORAL | 11 refills | Status: DC

## 2018-08-20 MED ORDER — REPAGLINIDE 2 MG PO TABS: 2.0000 mg | ORAL_TABLET | Freq: Three times a day (TID) | ORAL | 3 refills | Status: DC

## 2018-08-20 NOTE — Patient Instructions (Addendum)
Please increase the the repaglinide and acarbose both to 3 times a day (just before each meal) Please continue the same other diabetes medications. check your blood sugar once a day.  vary the time of day when you check, between before the 3 meals, and at bedtime.  also check if you have symptoms of your blood sugar being too high or too low.  please keep a record of the readings and bring it to your next appointment here (or you can bring the meter itself).  You can write it on any piece of paper.  please call us sooner if your blood sugar goes below 70, or if you have a lot of readings over 200. Please come back for a follow-up appointment in 3-4 months.

## 2018-08-20 NOTE — Progress Notes (Signed)
Subjective:    Patient ID: [name], male    DOB: 1950/11/26, 68 y.o.   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  Dx'ed: 9798 Complications: cerebrovascular disease.   Therapy: 5 oral meds.   DKA: never.  Severe hypoglycemia: never.  Pancreatitis: never.   Other: he can't take pioglitizone, due to edema; he has never taken insulin, but he has learned how.    Interval history: he does not check cbg's. pt states he feels well in general, except for intermitt swelling of the legs.  He takes meds as rx'ed.  Past Medical History:  Diagnosis Date  . Arthritis    right shoulder  . BPH (benign prostatic hyperplasia)   . Hyperlipidemia   . Hypertension   . Impotence of organic origin   . Iron deficiency anemia   . Mild atherosclerosis of carotid artery, bilateral    last doppler 12-17-2010   bilateral ICA 1-39%  . Nocturia more than twice per night   . Testicular hypofunction   . Type 2 diabetes mellitus [facility])    endocrinologist-- dr Loanne Drilling  . Weak urinary stream   . Wears glasses     Past Surgical History:  Procedure Laterality Date  . CYSTOSCOPY WITH INSERTION OF UROLIFT N/A 12/12/2017   Procedure: CYSTOSCOPY WITH INSERTION OF UROLIFT;  Surgeon: Festus Aloe, MD;  Location: [facility];  Service: Urology;  Laterality: N/A;  . SHOULDER SURGERY Right 2004, 1990  . WISDOM TOOTH EXTRACTION      Social History   Socioeconomic History  . Marital status: Married    Spouse name: [name]  . Number of children: 0  . Years of education: Not on file  . Highest education level: Not on file  Occupational History    Employer: [employer]  Social Needs  . Financial resource strain: Not on file  . Food insecurity    Worry: Not on file    Inability: Not on file  . Transportation needs    Medical: Not on file    Non-medical: Not on file   Tobacco Use  . Smoking status: Never Smoker  . Smokeless tobacco: Never Used  Substance and Sexual Activity  . Alcohol use: Not Currently    Alcohol/week: 0.0 standard drinks  . Drug use: No  . Sexual activity: Yes  Lifestyle  . Physical activity    Days per week: Not on file    Minutes per session: Not on file  . Stress: Not on file  Relationships  . Social Herbalist on phone: Not on file    Gets together: Not on file    Attends religious service: Not on file    Active member of club or organization: Not on file    Attends meetings of clubs or organizations: Not on file    Relationship status: Not on file  . Intimate partner violence    Fear of current or ex partner: Not on file    Emotionally abused: Not on file    Physically abused: Not on file    Forced sexual activity: Not on file  Other Topics Concern  . Not on file  Social History Narrative   Regular exercise-yes   Caffeine-2 cups   Married=wife [name] 22 years   Cigar Smoker-quit smoking Jan 2010    Current Outpatient Medications on File Prior  to Visit  Medication Sig Dispense Refill  . acyclovir (ZOVIRAX) 800 MG tablet Take 800 mg by mouth daily as needed.     [name] aspirin 81 MG tablet Take 81 mg by mouth daily.      [name] atenolol (TENORMIN) 50 MG tablet TAKE 1 TABLET DAILY 90 tablet 3  . Blood Glucose Monitoring Suppl (FREESTYLE FREEDOM LITE) w/Device KIT 1 Device by Does not apply route daily. 1 each 0  . bromocriptine (PARLODEL) 2.5 MG tablet TAKE 1 TABLET AT BEDTIME (Patient taking differently: Take 2.5 mg by mouth every evening. ) 90 tablet 4  . clotrimazole-betamethasone (LOTRISONE) cream Apply 1 application topically at bedtime. To feet (Patient taking differently: Apply 1 application topically daily as needed. To feet) 45 g 3  . glucose blood (FREESTYLE LITE) test strip Used to check blood sugars daily. 100 each 12  . hydrochlorothiazide (HYDRODIURIL) 25 MG tablet Take 25 mg by mouth every morning.      . IRON PO Take 1 tablet by mouth daily. Prescribed bid, taking qd    . JARDIANCE 25 MG TABS tablet TAKE 1 TABLET DAILY 90 tablet 4  . metFORMIN (GLUCOPHAGE-XR) 500 MG 24 hr tablet TAKE 2 TABLETS TWICE A DAY (Patient taking differently: Take 1,000 mg by mouth 2 (two) times daily. ) 360 tablet 4  . Multiple Vitamin (MULTIVITAMIN) tablet Take 1 tablet by mouth daily.      . rosuvastatin (CRESTOR) 10 MG tablet TAKE 1 TABLET DAILY 90 tablet 3  . sildenafil (VIAGRA) 100 MG tablet Take 100 mg by mouth daily as needed. Use as directed      No current facility-administered medications on file prior to visit.     Allergies  Allergen Reactions  . Actos [Pioglitazone] Swelling    edema  . Trandolapril Swelling     w/ Angioedema  . Azithromycin Rash  . Other Rash    MAGIC MOUTHWASH: CAUSES RASH  . Tetracycline Rash    Family History  Problem Relation Age of Onset  . Heart disease Mother 43       Deceased  . Heart attack Mother   . Hypertension Mother   . Diabetes Father 67       Deceased  . Hypertension Father   . Heart disease Sister        heart trouble  . Diabetes Brother   . Cancer Paternal Aunt        x1-Bone  . Cancer Paternal Aunt        multiple  . Heart attack Maternal Grandmother   . Healthy Sister        x6  . Colon cancer Neg Hx     BP 128/78 (BP Location: Left Arm, Patient Position: Sitting, Cuff Size: Normal)   Pulse 70   Ht _0  (1.676 m)   Wt 164 lb 12.8 oz (74.8 kg)   SpO2 98%   BMI 26.60 kg/m   Review of Systems Denies sob.      Objective:   Physical Exam VITAL SIGNS:  See vs page GENERAL: no distress Pulses: dorsalis pedis intact bilat.   MSK: no deformity of the feet CV: no leg edema Skin:  no ulcer on the feet.  normal color and temp on the feet. Neuro: sensation is intact to touch on the feet.  Ext: both great toenails are ingrown, but no erythema/drainage/swelling.    Lab Results  Component Value Date   HGBA1C 7.5 (A) 08/20/2018  Assessment & Plan:  Type 2 DM: he needs increased rx.  Edema: This limits rx options.     Patient Instructions  Please increase the the repaglinide and acarbose both to 3 times a day (just before each meal) Please continue the same other diabetes medications. check your blood sugar once a day.  vary the time of day when you check, between before the 3 meals, and at bedtime.  also check if you have symptoms of your blood sugar being too high or too low.  please keep a record of the readings and bring it to your next appointment here (or you can bring the meter itself).  You can write it on any piece of paper.  please call us sooner if your blood sugar goes below 70, or if you have a lot of readings over 200. Please come back for a follow-up appointment in 3-4 months.

## 2018-09-04 DIAGNOSIS — L564 Polymorphous light eruption: Secondary | ICD-10-CM | POA: Diagnosis not present

## 2018-09-08 ENCOUNTER — Ambulatory Visit (INDEPENDENT_AMBULATORY_CARE_PROVIDER_SITE_OTHER): Payer: Medicare Other

## 2018-09-08 ENCOUNTER — Other Ambulatory Visit: Payer: Self-pay

## 2018-09-08 DIAGNOSIS — Z23 Encounter for immunization: Secondary | ICD-10-CM

## 2018-09-17 ENCOUNTER — Other Ambulatory Visit: Payer: Self-pay

## 2018-09-17 ENCOUNTER — Telehealth: Payer: Self-pay | Admitting: Endocrinology

## 2018-09-17 DIAGNOSIS — E1159 Type 2 diabetes mellitus with other circulatory complications: Secondary | ICD-10-CM

## 2018-09-17 MED ORDER — ONETOUCH VERIO VI STRP: 1.0000 | ORAL_STRIP | Freq: Every day | 2 refills | Status: DC

## 2018-09-17 NOTE — Telephone Encounter (Signed)
glucose blood (ONETOUCH VERIO) test strip 100 each 2 09/17/2018    Sig - Route: 1 each by Other route daily. Use as instructed - Other   Sent to pharmacy as: glucose blood (ONETOUCH VERIO) test strip   E-Prescribing Status: Receipt confirmed by pharmacy (09/17/2018 10:17 AM EDT)

## 2018-09-17 NOTE — Telephone Encounter (Signed)
Posted with Dr. Dwyane Dee as error.  MEDICATION: One Touch Verio Test Strips  PHARMACY:  [facility]   IS THIS A 90 DAY SUPPLY :   IS PATIENT OUT OF MEDICATION: Yes  IF NOT; HOW MUCH IS LEFT:   LAST APPOINTMENT DATE: @8 /17/2020  NEXT APPOINTMENT DATE:@12 /14/2020  DO WE HAVE YOUR PERMISSION TO LEAVE A DETAILED MESSAGE:  OTHER COMMENTS:    **Let patient know to contact pharmacy at the end of the day to make sure medication is ready. **  ** Please notify patient to allow 48-72 hours to process**  **Encourage patient to contact the pharmacy for refills or they can request refills through [facility]**

## 2018-09-18 ENCOUNTER — Other Ambulatory Visit: Payer: Self-pay | Admitting: Endocrinology

## 2018-09-18 ENCOUNTER — Other Ambulatory Visit: Payer: Self-pay

## 2018-09-18 DIAGNOSIS — E1159 Type 2 diabetes mellitus with other circulatory complications: Secondary | ICD-10-CM

## 2018-09-18 MED ORDER — FREESTYLE FREEDOM LITE W/DEVICE KIT: 1.0000 | PACK | Freq: Once | 0 refills | Status: AC

## 2018-09-18 MED ORDER — FREESTYLE LITE TEST VI STRP: 1.0000 | ORAL_STRIP | Freq: Every day | 12 refills | Status: DC

## 2018-09-18 MED ORDER — FREESTYLE LANCETS MISC: 1.0000 | Freq: Every day | 2 refills | Status: DC

## 2018-09-19 ENCOUNTER — Other Ambulatory Visit: Payer: Self-pay

## 2018-09-19 ENCOUNTER — Telehealth: Payer: Self-pay | Admitting: Endocrinology

## 2018-09-19 DIAGNOSIS — E1159 Type 2 diabetes mellitus with other circulatory complications: Secondary | ICD-10-CM

## 2018-09-19 MED ORDER — FREESTYLE LITE TEST VI STRP: 1.0000 | ORAL_STRIP | Freq: Every day | 12 refills | Status: DC

## 2018-09-19 MED ORDER — FREESTYLE LANCETS MISC: 1.0000 | Freq: Every day | 2 refills | Status: AC

## 2018-09-19 NOTE — Telephone Encounter (Signed)
Patient called and advised that the prescriptions for Free Style Lite test strips and lancets need to [facility] mail in service instead of [facility] so that he can get a 90 day supply at a more reasonable cost

## 2018-09-19 NOTE — Telephone Encounter (Signed)
E-Prescribing Status: Receipt confirmed by pharmacy (09/19/2018 2:17 PM EDT)

## 2018-09-21 ENCOUNTER — Telehealth: Payer: Self-pay | Admitting: Endocrinology

## 2018-09-21 ENCOUNTER — Other Ambulatory Visit: Payer: Self-pay

## 2018-09-21 DIAGNOSIS — E1159 Type 2 diabetes mellitus with other circulatory complications: Secondary | ICD-10-CM

## 2018-09-21 MED ORDER — FREESTYLE LITE TEST VI STRP: 1.0000 | ORAL_STRIP | Freq: Every day | 12 refills | Status: DC

## 2018-09-21 NOTE — Telephone Encounter (Signed)
Pt needs the freestyle test strips sent to [facility] please

## 2018-09-21 NOTE — Telephone Encounter (Signed)
glucose blood (FREESTYLE LITE) test strip 100 each 12 09/21/2018    Sig - Route: 1 each by Other route daily. Use to monitor glucose levels daily; E11.9 - Other   Sent to pharmacy as: glucose blood (FREESTYLE LITE) test strip   E-Prescribing Status: Receipt confirmed by pharmacy (09/21/2018 3:28 PM EDT)

## 2018-09-27 ENCOUNTER — Telehealth: Payer: Self-pay | Admitting: *Deleted

## 2018-09-27 NOTE — Telephone Encounter (Signed)
We [facility] for triamcinolone.  Left message on machine that patient will need an ov or to call the dermatologist office.

## 2018-10-01 ENCOUNTER — Other Ambulatory Visit: Payer: Self-pay

## 2018-10-01 ENCOUNTER — Telehealth: Payer: Self-pay | Admitting: Endocrinology

## 2018-10-01 ENCOUNTER — Other Ambulatory Visit: Payer: Self-pay | Admitting: Endocrinology

## 2018-10-01 DIAGNOSIS — E1159 Type 2 diabetes mellitus with other circulatory complications: Secondary | ICD-10-CM

## 2018-10-01 MED ORDER — FREESTYLE LITE TEST VI STRP: 1.0000 | ORAL_STRIP | Freq: Every day | 12 refills | Status: DC

## 2018-10-01 NOTE — Telephone Encounter (Signed)
Unfortunately, pt was given incorrect information from [facility] which has resulted in a delay on their end. Rx was sent successfully on 09/21/18 and was NOT cancelled as clearly indicated below. There isn't an option to cancel an order once sent electronically. Only way to cancel an order is to call [facility] directly. This did not occur.  glucose blood (FREESTYLE LITE) test strip 100 each 12 09/21/2018    Sig - Route: 1 each by Other route daily. Use to monitor glucose levels daily; E11.9 - Other   Sent to pharmacy as: glucose blood (FREESTYLE LITE) test strip   E-Prescribing Status: Receipt confirmed by pharmacy (09/21/2018  3:28 PM EDT)    Rx has been re-sent today as indicated below.  glucose blood (FREESTYLE LITE) test strip 100 each 12 10/01/2018    Sig - Route: 1 each by Other route daily. Use to monitor glucose levels daily; E11.9 - Other   Sent to pharmacy as: glucose blood (FREESTYLE LITE) test strip   E-Prescribing Status: Receipt confirmed by pharmacy (10/01/2018  3:03 PM EDT)  [place], [place]

## 2018-10-01 NOTE — Telephone Encounter (Signed)
Patient requests the following RX to be resent (100 test strips):  MEDICATION: glucose blood (FREESTYLE LITE) test strip  PHARMACY:   [place], [place] [phone] (Phone) [phone] (Fax)    IS THIS A 90 DAY SUPPLY : Yes  IS PATIENT OUT OF MEDICATION: Yes  IF NOT; HOW MUCH IS LEFT: 0  LAST APPOINTMENT DATE: @9 /18/2020  NEXT APPOINTMENT DATE:@12 /14/2020  DO WE HAVE YOUR PERMISSION TO LEAVE A DETAILED MESSAGE: Yes  OTHER COMMENTS:  [facility] told patient the above RX that was sent on 09/21/18 was cancelled by Dr. Loanne Drilling.  **Let patient know to contact pharmacy at the end of the day to make sure medication is ready. **  ** Please notify patient to allow 48-72 hours to process**  **Encourage patient to contact the pharmacy for refills or they can request refills through [facility]**

## 2018-11-15 ENCOUNTER — Other Ambulatory Visit: Payer: Self-pay | Admitting: Endocrinology

## 2018-11-26 DIAGNOSIS — L308 Other specified dermatitis: Secondary | ICD-10-CM | POA: Diagnosis not present

## 2018-12-17 ENCOUNTER — Ambulatory Visit: Admitting: Endocrinology

## 2018-12-19 ENCOUNTER — Other Ambulatory Visit: Payer: Self-pay

## 2018-12-19 ENCOUNTER — Ambulatory Visit: Payer: Medicare Other | Attending: Internal Medicine

## 2018-12-19 DIAGNOSIS — Z20828 Contact with and (suspected) exposure to other viral communicable diseases: Secondary | ICD-10-CM | POA: Diagnosis not present

## 2018-12-19 DIAGNOSIS — Z20822 Contact with and (suspected) exposure to covid-19: Secondary | ICD-10-CM

## 2018-12-21 ENCOUNTER — Other Ambulatory Visit: Payer: Self-pay

## 2018-12-21 ENCOUNTER — Encounter: Payer: Self-pay | Admitting: Endocrinology

## 2018-12-21 ENCOUNTER — Ambulatory Visit (INDEPENDENT_AMBULATORY_CARE_PROVIDER_SITE_OTHER): Payer: Medicare Other | Admitting: Endocrinology

## 2018-12-21 VITALS — BP 126/80 | HR 75 | Ht 66.0 in | Wt 167.8 lb

## 2018-12-21 DIAGNOSIS — E1159 Type 2 diabetes mellitus with other circulatory complications: Secondary | ICD-10-CM | POA: Diagnosis not present

## 2018-12-21 LAB — POCT GLYCOSYLATED HEMOGLOBIN (HGB A1C): Hemoglobin A1C: 7.3 % — AB (ref 4.0–5.6)

## 2018-12-21 LAB — NOVEL CORONAVIRUS, NAA: SARS-CoV-2, NAA: NOT DETECTED

## 2018-12-21 NOTE — Patient Instructions (Signed)
Please continue the same medications.  Please come back for a follow-up appointment in 3 months.  

## 2018-12-21 NOTE — Progress Notes (Signed)
Subjective:    Patient ID: [name], male    DOB: 08/12/1950, 68 y.o.   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  Dx'ed: 99991111 Complications: cerebrovascular disease.   Therapy: 5 oral meds.   DKA: never.  Severe hypoglycemia: never.  Pancreatitis: never.   Other: he can't take pioglitizone, due to edema; he has never taken insulin, but he has learned how; he does not check cbg's; he is retired.   Interval history:  pt states he feels well in general.  He takes meds as rx'ed.   Past Medical History:  Diagnosis Date  . Arthritis    right shoulder  . BPH (benign prostatic hyperplasia)   . Hyperlipidemia   . Hypertension   . Impotence of organic origin   . Iron deficiency anemia   . Mild atherosclerosis of carotid artery, bilateral    last doppler 12-17-2010   bilateral ICA 1-39%  . Nocturia more than twice per night   . Testicular hypofunction   . Type 2 diabetes mellitus [facility])    endocrinologist-- dr Loanne Drilling  . Weak urinary stream   . Wears glasses     Past Surgical History:  Procedure Laterality Date  . CYSTOSCOPY WITH INSERTION OF UROLIFT N/A 12/12/2017   Procedure: CYSTOSCOPY WITH INSERTION OF UROLIFT;  Surgeon: Festus Aloe, MD;  Location: [facility];  Service: Urology;  Laterality: N/A;  . SHOULDER SURGERY Right 2004, 1990  . WISDOM TOOTH EXTRACTION      Social History   Socioeconomic History  . Marital status: Married    Spouse name: [name]  . Number of children: 0  . Years of education: Not on file  . Highest education level: Not on file  Occupational History    Employer: [employer]  Tobacco Use  . Smoking status: Never Smoker  . Smokeless tobacco: Never Used  Substance and Sexual Activity  . Alcohol use: Not Currently    Alcohol/week: 0.0 standard drinks  . Drug use: No  . Sexual activity: Yes  Other Topics  Concern  . Not on file  Social History Narrative   Regular exercise-yes   Caffeine-2 cups   Married=wife [name] 22 years   Cigar Smoker-quit smoking Jan 2010   Social Determinants of Health   Financial Resource Strain:   . Difficulty of Paying Living Expenses: Not on file  Food Insecurity:   . Worried About Charity fundraiser in the Last Year: Not on file  . Ran Out of Food in the Last Year: Not on file  Transportation Needs:   . Lack of Transportation (Medical): Not on file  . Lack of Transportation (Non-Medical): Not on file  Physical Activity:   . Days of Exercise per Week: Not on file  . Minutes of Exercise per Session: Not on file  Stress:   . Feeling of Stress : Not on file  Social Connections:   . Frequency of Communication with Friends and Family: Not on file  . Frequency of Social Gatherings with Friends and Family: Not on file  . Attends Religious Services: Not on file  . Active Member of Clubs or Organizations: Not on file  . Attends Archivist Meetings: Not on file  . Marital Status: Not on file  Intimate Partner Violence:   . Fear of Current or Ex-Partner: Not on file  . Emotionally  Abused: Not on file  . Physically Abused: Not on file  . Sexually Abused: Not on file    Current Outpatient Medications on File Prior to Visit  Medication Sig Dispense Refill  . acarbose (PRECOSE) 25 MG tablet Take 1 tablet (25 mg total) by mouth 3 (three) times daily with meals. 270 tablet 11  . acyclovir (ZOVIRAX) 800 MG tablet Take 800 mg by mouth daily as needed.     [name] aspirin 81 MG tablet Take 81 mg by mouth daily.      [name] atenolol (TENORMIN) 50 MG tablet TAKE 1 TABLET DAILY 90 tablet 3  . bromocriptine (PARLODEL) 2.5 MG tablet TAKE 1 TABLET AT BEDTIME (Patient taking differently: Take 2.5 mg by mouth every evening. ) 90 tablet 4  . clotrimazole-betamethasone (LOTRISONE) cream Apply 1 application topically at bedtime. To feet (Patient taking differently: Apply 1  application topically daily as needed. To feet) 45 g 3  . glucose blood (FREESTYLE LITE) test strip 1 each by Other route daily. Use to monitor glucose levels daily; E11.9 100 each 12  . hydrochlorothiazide (HYDRODIURIL) 25 MG tablet Take 25 mg by mouth every morning.     . IRON PO Take 1 tablet by mouth daily. Prescribed bid, taking qd    . JARDIANCE 25 MG TABS tablet TAKE 1 TABLET DAILY 90 tablet 4  . Lancets (FREESTYLE) lancets 1 each by Other route daily. Use to monitor glucose levels daily; E11.9 100 each 2  . metFORMIN (GLUCOPHAGE-XR) 500 MG 24 hr tablet TAKE 2 TABLETS TWICE A DAY 360 tablet 3  . Multiple Vitamin (MULTIVITAMIN) tablet Take 1 tablet by mouth daily.      . repaglinide (PRANDIN) 2 MG tablet TAKE 1 TABLET TWICE A DAY BEFORE MEALS 180 tablet 3  . rosuvastatin (CRESTOR) 10 MG tablet TAKE 1 TABLET DAILY 90 tablet 3  . sildenafil (VIAGRA) 100 MG tablet Take 100 mg by mouth daily as needed. Use as directed      No current facility-administered medications on file prior to visit.    Allergies  Allergen Reactions  . Actos [Pioglitazone] Swelling    edema  . Trandolapril Swelling     w/ Angioedema  . Azithromycin Rash  . Other Rash    MAGIC MOUTHWASH: CAUSES RASH  . Tetracycline Rash    Family History  Problem Relation Age of Onset  . Heart disease Mother 36       Deceased  . Heart attack Mother   . Hypertension Mother   . Diabetes Father 52       Deceased  . Hypertension Father   . Heart disease Sister        heart trouble  . Diabetes Brother   . Cancer Paternal Aunt        x1-Bone  . Cancer Paternal Aunt        multiple  . Heart attack Maternal Grandmother   . Healthy Sister        x6  . Colon cancer Neg Hx     BP 126/80 (BP Location: Left Arm, Patient Position: Sitting, Cuff Size: Normal)   Pulse 75   Ht 5\' 6"  (1.676 m)   Wt 167 lb 12.8 oz (76.1 kg)   SpO2 99%   BMI 27.08 kg/m    Review of Systems Denies diarrhea.      Objective:   Physical  Exam VITAL SIGNS:  See vs page GENERAL: no distress Pulses: dorsalis pedis intact bilat.   MSK: no  deformity of the feet CV: no leg edema Skin:  no ulcer on the feet.  normal color and temp on the feet. Neuro: sensation is intact to touch on the feet  Lab Results  Component Value Date   HGBA1C 7.3 (A) 12/21/2018       Assessment & Plan:  Type 2 DM: he would benefit from increased rx, if it can be done with a regimen that avoids or minimizes hypoglycemia.  However, he declines to increase medication.    Patient Instructions  Please continue the same medications Please come back for a follow-up appointment in 3 months.

## 2018-12-27 ENCOUNTER — Ambulatory Visit: Payer: Medicare Other | Admitting: Family Medicine

## 2018-12-27 ENCOUNTER — Ambulatory Visit: Payer: Medicare Other | Admitting: *Deleted

## 2019-01-01 ENCOUNTER — Other Ambulatory Visit: Payer: Self-pay | Admitting: Endocrinology

## 2019-01-08 ENCOUNTER — Ambulatory Visit: Payer: Medicare Other | Attending: Internal Medicine

## 2019-01-08 DIAGNOSIS — Z20822 Contact with and (suspected) exposure to covid-19: Secondary | ICD-10-CM | POA: Diagnosis not present

## 2019-01-10 LAB — NOVEL CORONAVIRUS, NAA: SARS-CoV-2, NAA: NOT DETECTED

## 2019-01-16 NOTE — Progress Notes (Signed)
Virtual Visit via Video Note  I connected with patient on 01/17/19 at  8:00 AM EST by audio enabled telemedicine application and verified that I am speaking with the correct person using two identifiers.   THIS ENCOUNTER IS A VIRTUAL VISIT DUE TO COVID-19 - PATIENT WAS NOT SEEN IN THE OFFICE. PATIENT HAS CONSENTED TO VIRTUAL VISIT / TELEMEDICINE VISIT   Location of patient: home  Location of provider: office  I discussed the limitations of evaluation and management by telemedicine and the availability of in person appointments. The patient expressed understanding and agreed to proceed.   Subjective:   [name] is a 69 y.o. male who presents for Medicare Annual/Subsequent preventive examination.  Review of Systems:  Home Safety/Smoke Alarms: Feels safe in home. Smoke alarms in place.  Live with wife in 2 story home. No trouble w/ stairs.  Male:   CCS- 03/04/13. Recall 10 yrs.  PSA-  Lab Results  Component Value Date   PSA 1.23 03/17/2014   PSA 0.85 06/30/2011   PSA 0.61 05/05/2010   Eye- Pt reports he is UTD on DM eye exam.Dr.Wilkerson      Objective:    Vitals: Unable to assess. This visit is enabled though telemedicine due to Covid 19.  Advanced Directives 01/17/2019 12/12/2017 01/10/2017  Does Patient Have a Medical Advance Directive? No No No  Would patient like information on creating a medical advance directive? No - Patient declined No - Patient declined -    Tobacco Social History   Tobacco Use  Smoking Status Never Smoker  Smokeless Tobacco Never Used     Counseling given: Not Answered   Clinical Intake: Pain : No/denies pain      Past Medical History:  Diagnosis Date  . Arthritis    right shoulder  . BPH (benign prostatic hyperplasia)   . Hyperlipidemia   . Hypertension   . Impotence of organic origin   . Iron deficiency anemia   . Mild atherosclerosis of carotid artery, bilateral    last doppler 12-17-2010   bilateral ICA 1-39%  .  Nocturia more than twice per night   . Testicular hypofunction   . Type 2 diabetes mellitus [facility])    endocrinologist-- dr Loanne Drilling  . Weak urinary stream   . Wears glasses    Past Surgical History:  Procedure Laterality Date  . CYSTOSCOPY WITH INSERTION OF UROLIFT N/A 12/12/2017   Procedure: CYSTOSCOPY WITH INSERTION OF UROLIFT;  Surgeon: Festus Aloe, MD;  Location: [facility];  Service: Urology;  Laterality: N/A;  . SHOULDER SURGERY Right 2004, 1990  . WISDOM TOOTH EXTRACTION     Family History  Problem Relation Age of Onset  . Heart disease Mother 51       Deceased  . Heart attack Mother   . Hypertension Mother   . Diabetes Father 57       Deceased  . Hypertension Father   . Heart disease Sister        heart trouble  . Diabetes Brother   . Cancer Paternal Aunt        x1-Bone  . Cancer Paternal Aunt        multiple  . Heart attack Maternal Grandmother   . Healthy Sister        x6  . Colon cancer Neg Hx    Social History   Socioeconomic History  . Marital status: Married    Spouse name: [name]  . Number of children: 0  . Years  of education: Not on file  . Highest education level: Not on file  Occupational History    Employer: [employer]  Tobacco Use  . Smoking status: Never Smoker  . Smokeless tobacco: Never Used  Substance and Sexual Activity  . Alcohol use: Not Currently    Alcohol/week: 0.0 standard drinks  . Drug use: No  . Sexual activity: Yes  Other Topics Concern  . Not on file  Social History Narrative   Regular exercise-yes   Caffeine-2 cups   Married=wife [name] 22 years   Cigar Smoker-quit smoking Jan 2010   Social Determinants of Health   Financial Resource Strain:   . Difficulty of Paying Living Expenses: Not on file  Food Insecurity:   . Worried About Charity fundraiser in the Last Year: Not on file  . Ran Out of Food in the Last Year: Not on file  Transportation Needs:   . Lack of Transportation  (Medical): Not on file  . Lack of Transportation (Non-Medical): Not on file  Physical Activity:   . Days of Exercise per Week: Not on file  . Minutes of Exercise per Session: Not on file  Stress:   . Feeling of Stress : Not on file  Social Connections:   . Frequency of Communication with Friends and Family: Not on file  . Frequency of Social Gatherings with Friends and Family: Not on file  . Attends Religious Services: Not on file  . Active Member of Clubs or Organizations: Not on file  . Attends Archivist Meetings: Not on file  . Marital Status: Not on file    Outpatient Encounter Medications as of 01/17/2019  Medication Sig  . acarbose (PRECOSE) 25 MG tablet Take 1 tablet (25 mg total) by mouth 3 (three) times daily with meals.  [name] acyclovir (ZOVIRAX) 800 MG tablet Take 800 mg by mouth daily as needed.   [name] aspirin 81 MG tablet Take 81 mg by mouth daily.    [name] atenolol (TENORMIN) 50 MG tablet TAKE 1 TABLET DAILY  . bromocriptine (PARLODEL) 2.5 MG tablet TAKE 1 TABLET AT BEDTIME (Patient taking differently: Take 2.5 mg by mouth every evening. )  . glucose blood (FREESTYLE LITE) test strip 1 each by Other route daily. Use to monitor glucose levels daily; E11.9  . JARDIANCE 25 MG TABS tablet TAKE 1 TABLET DAILY  . Lancets (FREESTYLE) lancets 1 each by Other route daily. Use to monitor glucose levels daily; E11.9  . metFORMIN (GLUCOPHAGE-XR) 500 MG 24 hr tablet TAKE 2 TABLETS TWICE A DAY  . Multiple Vitamin (MULTIVITAMIN) tablet Take 1 tablet by mouth daily.    . repaglinide (PRANDIN) 2 MG tablet TAKE 1 TABLET TWICE A DAY BEFORE MEALS  . rosuvastatin (CRESTOR) 10 MG tablet TAKE 1 TABLET DAILY  . sildenafil (VIAGRA) 100 MG tablet Take 100 mg by mouth daily as needed. Use as directed   . clotrimazole-betamethasone (LOTRISONE) cream Apply 1 application topically at bedtime. To feet (Patient not taking: Reported on 01/17/2019)  . hydrochlorothiazide (HYDRODIURIL) 25 MG tablet Take 25  mg by mouth every morning.   . IRON PO Take 1 tablet by mouth daily. Prescribed bid, taking qd   No facility-administered encounter medications on file as of 01/17/2019.    Activities of Daily Living In your present state of health, do you have any difficulty performing the following activities: 01/17/2019  Hearing? N  Vision? N  Difficulty concentrating or making decisions? N  Walking or climbing stairs?  N  Dressing or bathing? N  Doing errands, shopping? N  Preparing Food and eating ? N  Using the Toilet? N  In the past six months, have you accidently leaked urine? N  Do you have problems with loss of bowel control? N  Managing your Medications? N  Managing your Finances? N  Housekeeping or managing your Housekeeping? N  Some recent data might be hidden    Patient Care Team: Shelda Pal, DO as PCP - General (Family Medicine)   Assessment:   This is a routine wellness examination for Izen. Physical assessment deferred to PCP.  Exercise Activities and Dietary recommendations Current Exercise Habits: Home exercise routine, Type of exercise: strength training/weights, Time (Minutes): 10, Frequency (Times/Week): 3, Weekly Exercise (Minutes/Week): 30, Intensity: Mild, Exercise limited by: None identified   Diet (meal preparation, eat out, water intake, caffeinated beverages, dairy products, fruits and vegetables): well balanced   Goals    . DIET - INCREASE WATER INTAKE       Fall Risk Fall Risk  01/17/2019  Falls in the past year? 0  Number falls in past yr: 0  Injury with Fall? 0  Follow up Education provided;Falls prevention discussed     Depression Screen PHQ 2/9 Scores 01/17/2019 03/17/2014 11/01/2012  PHQ - 2 Score 0 0 0    Cognitive Function Ad8 score reviewed for issues:  Issues making decisions:no  Less interest in hobbies / activities:no  Repeats questions, stories (family complaining):no  Trouble using ordinary gadgets (microwave, computer,  phone):no  Forgets the month or year: no  Mismanaging finances: no  Remembering appts:no  Daily problems with thinking and/or memory:no Ad8 score is=0         Immunization History  Administered Date(s) Administered  . Fluad Quad(high Dose 65+) 09/08/2018  . Influenza Split 10/13/2010  . Influenza Whole 10/03/2009  . Influenza, High Dose Seasonal PF 10/27/2016, 09/22/2017  . Influenza,inj,Quad PF,6+ Mos 10/10/2012  . Pneumococcal Conjugate-13 10/27/2016  . Pneumococcal Polysaccharide-23 09/22/2015  . Td 04/11/2013  . Zoster 11/03/2015     Screening Tests Health Maintenance  Topic Date Due  . URINE MICROALBUMIN  03/10/2017  . FOOT EXAM  01/24/2019  . HEMOGLOBIN A1C  06/21/2019  . OPHTHALMOLOGY EXAM  08/06/2019  . PNA vac Low Risk Adult (2 of 2 - PPSV23) 09/21/2020  . COLONOSCOPY  03/05/2023  . TETANUS/TDAP  04/12/2023  . INFLUENZA VACCINE  Completed  . Hepatitis C Screening  Completed     Plan:   See you next year.  Continue to eat heart healthy diet (full of fruits, vegetables, whole grains, lean protein, water--limit salt, fat, and sugar intake) and increase physical activity as tolerated.  Continue doing brain stimulating activities (puzzles, reading, adult coloring books, staying active) to keep memory sharp.   Bring a copy of your living will and/or healthcare power of attorney to your next office visit.    I have personally reviewed and noted the following in the patient's chart:   . Medical and social history . Use of alcohol, tobacco or illicit drugs  . Current medications and supplements . Functional ability and status . Nutritional status . Physical activity . Advanced directives . List of other physicians . Hospitalizations, surgeries, and ER visits in previous 12 months . Vitals . Screenings to include cognitive, depression, and falls . Referrals and appointments  In addition, I have reviewed and discussed with patient certain preventive  protocols, quality metrics, and best practice recommendations. A written personalized care plan  for preventive services as well as general preventive health recommendations were provided to patient.     Shela Nevin, [place]  01/17/2019

## 2019-01-17 ENCOUNTER — Other Ambulatory Visit: Payer: Self-pay

## 2019-01-17 ENCOUNTER — Ambulatory Visit: Payer: Medicare Other | Admitting: Family Medicine

## 2019-01-17 ENCOUNTER — Encounter: Payer: Self-pay | Admitting: *Deleted

## 2019-01-17 ENCOUNTER — Ambulatory Visit (INDEPENDENT_AMBULATORY_CARE_PROVIDER_SITE_OTHER): Payer: Medicare Other | Admitting: *Deleted

## 2019-01-17 ENCOUNTER — Other Ambulatory Visit: Payer: Self-pay | Admitting: Endocrinology

## 2019-01-17 DIAGNOSIS — Z0000 Encounter for general adult medical examination without abnormal findings: Secondary | ICD-10-CM | POA: Diagnosis not present

## 2019-01-17 NOTE — Patient Instructions (Signed)
See you next year.  Continue to eat heart healthy diet (full of fruits, vegetables, whole grains, lean protein, water--limit salt, fat, and sugar intake) and increase physical activity as tolerated.  Continue doing brain stimulating activities (puzzles, reading, adult coloring books, staying active) to keep memory sharp.   Bring a copy of your living will and/or healthcare power of attorney to your next office visit.   [name] , Thank you for taking time to come for your Medicare Wellness Visit. I appreciate your ongoing commitment to your health goals. Please review the following plan we discussed and let me know if I can assist you in the future.   These are the goals we discussed: Goals    . DIET - INCREASE WATER INTAKE       This is a list of the screening recommended for you and due dates:  Health Maintenance  Topic Date Due  . Urine Protein Check  03/10/2017  . Complete foot exam   01/24/2019  . Hemoglobin A1C  06/21/2019  . Eye exam for diabetics  08/06/2019  . Pneumonia vaccines (2 of 2 - PPSV23) 09/21/2020  . Colon Cancer Screening  03/05/2023  . Tetanus Vaccine  04/12/2023  . Flu Shot  Completed  .  Hepatitis C: One time screening is recommended by Center for Disease Control  (CDC) for  adults born from [birth year] through 1964.   Completed    Preventive Care 7 Years and Older, Male Preventive care refers to lifestyle choices and visits with your health care provider that can promote health and wellness. This includes:  A yearly physical exam. This is also called an annual well check.  Regular dental and eye exams.  Immunizations.  Screening for certain conditions.  Healthy lifestyle choices, such as diet and exercise. What can I expect for my preventive care visit? Physical exam Your health care provider will check:  Height and weight. These may be used to calculate body mass index (BMI), which is a measurement that tells if you are at a healthy  weight.  Heart rate and blood pressure.  Your skin for abnormal spots. Counseling Your health care provider may ask you questions about:  Alcohol, tobacco, and drug use.  Emotional well-being.  Home and relationship well-being.  Sexual activity.  Eating habits.  History of falls.  Memory and ability to understand (cognition).  Work and work Statistician. What immunizations do I need?  Influenza (flu) vaccine  This is recommended every year. Tetanus, diphtheria, and pertussis (Tdap) vaccine  You may need a Td booster every 10 years. Varicella (chickenpox) vaccine  You may need this vaccine if you have not already been vaccinated. Zoster (shingles) vaccine  You may need this after age 10. Pneumococcal conjugate (PCV13) vaccine  One dose is recommended after age 44. Pneumococcal polysaccharide (PPSV23) vaccine  One dose is recommended after age 30. Measles, mumps, and rubella (MMR) vaccine  You may need at least one dose of MMR if you were born in 1957 or later. You may also need a second dose. Meningococcal conjugate (MenACWY) vaccine  You may need this if you have certain conditions. Hepatitis A vaccine  You may need this if you have certain conditions or if you travel or work in places where you may be exposed to hepatitis A. Hepatitis B vaccine  You may need this if you have certain conditions or if you travel or work in places where you may be exposed to hepatitis B. Haemophilus influenzae type b (  Hib) vaccine  You may need this if you have certain conditions. You may receive vaccines as individual doses or as more than one vaccine together in one shot (combination vaccines). Talk with your health care provider about the risks and benefits of combination vaccines. What tests do I need? Blood tests  Lipid and cholesterol levels. These may be checked every 5 years, or more frequently depending on your overall health.  Hepatitis C test.  Hepatitis B  test. Screening  Lung cancer screening. You may have this screening every year starting at age 24 if you have a 30-pack-year history of smoking and currently smoke or have quit within the past 15 years.  Colorectal cancer screening. All adults should have this screening starting at age 35 and continuing until age 45. Your health care provider may recommend screening at age 74 if you are at increased risk. You will have tests every 1-10 years, depending on your results and the type of screening test.  Prostate cancer screening. Recommendations will vary depending on your family history and other risks.  Diabetes screening. This is done by checking your blood sugar (glucose) after you have not eaten for a while (fasting). You may have this done every 1-3 years.  Abdominal aortic aneurysm (AAA) screening. You may need this if you are a current or former smoker.  Sexually transmitted disease (STD) testing. Follow these instructions at home: Eating and drinking  Eat a diet that includes fresh fruits and vegetables, whole grains, lean protein, and low-fat dairy products. Limit your intake of foods with high amounts of sugar, saturated fats, and salt.  Take vitamin and mineral supplements as recommended by your health care provider.  Do not drink alcohol if your health care provider tells you not to drink.  If you drink alcohol: ? Limit how much you have to 0-2 drinks a day. ? Be aware of how much alcohol is in your drink. In the [place] one drink equals one 12 oz bottle of beer (355 mL), one 5 oz glass of wine (148 mL), or one 1 oz glass of hard liquor (44 mL). Lifestyle  Take daily care of your teeth and gums.  Stay active. Exercise for at least 30 minutes on 5 or more days each week.  Do not use any products that contain nicotine or tobacco, such as cigarettes, e-cigarettes, and chewing tobacco. If you need help quitting, ask your health care provider.  If you are sexually active,  practice safe sex. Use a condom or other form of protection to prevent STIs (sexually transmitted infections).  Talk with your health care provider about taking a low-dose aspirin or statin. What's next?  Visit your health care provider once a year for a well check visit.  Ask your health care provider how often you should have your eyes and teeth checked.  Stay up to date on all vaccines. This information is not intended to replace advice given to you by your health care provider. Make sure you discuss any questions you have with your health care provider. Document Revised: 12/14/2017 Document Reviewed: 12/14/2017 Elsevier Patient Education  2020 [employer].

## 2019-01-21 ENCOUNTER — Other Ambulatory Visit: Payer: Self-pay

## 2019-01-22 ENCOUNTER — Encounter: Payer: Self-pay | Admitting: Family Medicine

## 2019-01-22 ENCOUNTER — Other Ambulatory Visit: Payer: Self-pay

## 2019-01-22 ENCOUNTER — Ambulatory Visit (INDEPENDENT_AMBULATORY_CARE_PROVIDER_SITE_OTHER): Payer: Medicare Other | Admitting: Family Medicine

## 2019-01-22 VITALS — BP 118/72 | HR 76 | Temp 96.0°F | Ht 67.0 in | Wt 166.4 lb

## 2019-01-22 DIAGNOSIS — I10 Essential (primary) hypertension: Secondary | ICD-10-CM | POA: Diagnosis not present

## 2019-01-22 LAB — BASIC METABOLIC PANEL
BUN: 14 mg/dL (ref 6–23)
CO2: 27 mEq/L (ref 19–32)
Calcium: 9.5 mg/dL (ref 8.4–10.5)
Chloride: 100 mEq/L (ref 96–112)
Creatinine, Ser: 1.01 mg/dL (ref 0.40–1.50)
GFR: 89.07 mL/min (ref >60.00)
Glucose, Bld: 145 mg/dL — ABNORMAL HIGH (ref 70–99)
Potassium: 4.3 mEq/L (ref 3.5–5.1)
Sodium: 139 mEq/L (ref 135–145)

## 2019-01-22 NOTE — Patient Instructions (Signed)
Keep the diet clean and stay active.  Give us 2-3 business days to get the results of your labs back.   Let us know if you need anything.  

## 2019-01-22 NOTE — Progress Notes (Signed)
Chief Complaint  Patient presents with  . Follow-up    Subjective [name] is a 69 y.o. male who presents for hypertension follow up. He does not monitor home blood pressures. He is compliant with medication- atenolol 50 mg/d. Patient has these side effects of medication: none He is adhering to a healthy diet overall. Current exercise: rowing machine, walking   Past Medical History:  Diagnosis Date  . Arthritis    right shoulder  . BPH (benign prostatic hyperplasia)   . Hyperlipidemia   . Hypertension   . Impotence of organic origin   . Iron deficiency anemia   . Mild atherosclerosis of carotid artery, bilateral    last doppler 12-17-2010   bilateral ICA 1-39%  . Nocturia more than twice per night   . Testicular hypofunction   . Type 2 diabetes mellitus [facility])    endocrinologist-- dr Loanne Drilling  . Weak urinary stream   . Wears glasses     Review of Systems Cardiovascular: no chest pain Respiratory:  no shortness of breath  Exam BP 118/72 (BP Location: Left Arm, Patient Position: Sitting, Cuff Size: Normal)   Pulse 76   Temp (!) 96 F (35.6 C) (Temporal)   Ht 5\' 7"  (1.702 m)   Wt 166 lb 6 oz (75.5 kg)   SpO2 97%   BMI 26.06 kg/m  General:  well developed, well nourished, in no apparent distress Heart: RRR, no LE edema Lungs: clear to auscultation, no accessory muscle use Psych: well oriented with normal range of affect and appropriate judgment/insight  Essential hypertension - Plan: Basic metabolic panel  Cont Atenolol. Counseled on diet and exercise. F/u in 1 yr for med ck. The patient voiced understanding and agreement to the plan.  [place], DO 01/22/19  10:50 AM

## 2019-02-05 ENCOUNTER — Other Ambulatory Visit: Payer: Self-pay | Admitting: Endocrinology

## 2019-02-13 ENCOUNTER — Other Ambulatory Visit: Payer: Self-pay | Admitting: Podiatry

## 2019-02-13 ENCOUNTER — Encounter: Payer: Self-pay | Admitting: Podiatry

## 2019-02-13 ENCOUNTER — Ambulatory Visit (INDEPENDENT_AMBULATORY_CARE_PROVIDER_SITE_OTHER): Payer: Medicare Other | Admitting: Podiatry

## 2019-02-13 ENCOUNTER — Other Ambulatory Visit: Payer: Self-pay

## 2019-02-13 ENCOUNTER — Ambulatory Visit (INDEPENDENT_AMBULATORY_CARE_PROVIDER_SITE_OTHER): Payer: Medicare Other

## 2019-02-13 VITALS — Temp 97.2°F

## 2019-02-13 DIAGNOSIS — M79671 Pain in right foot: Secondary | ICD-10-CM

## 2019-02-13 DIAGNOSIS — M79672 Pain in left foot: Secondary | ICD-10-CM

## 2019-02-13 DIAGNOSIS — M76822 Posterior tibial tendinitis, left leg: Secondary | ICD-10-CM

## 2019-02-13 DIAGNOSIS — M76821 Posterior tibial tendinitis, right leg: Secondary | ICD-10-CM

## 2019-02-14 NOTE — Progress Notes (Signed)
Subjective:   Patient ID: [name], male   DOB: 69 y.o.   MRN: [id number]   HPI Patient presents stating both ankle started to hurt him in the last couple weeks and he did have relief for a significant period of time   ROS      Objective:  Physical Exam  Neurovascular status intact with patient's posterior tib tendon as it comes under the medial malleolus irritative bilateral right over left with mild swelling     Assessment:  Posterior tibial tendinitis right over left with moderate dysfunction of the feet     Plan:  Sterile prep and injected the posterior tib along its course and insertion into the navicular bilateral 3 mg Dexasone Kenalog 5 mg Xylocaine and advised on supportive therapy.  Reappoint to recheck  X-rays indicate there is moderate depression of the arch bilateral no indications of advanced arthritis

## 2019-03-19 ENCOUNTER — Other Ambulatory Visit: Payer: Self-pay

## 2019-03-19 DIAGNOSIS — Z79899 Other long term (current) drug therapy: Secondary | ICD-10-CM | POA: Diagnosis not present

## 2019-03-19 DIAGNOSIS — E663 Overweight: Secondary | ICD-10-CM | POA: Diagnosis not present

## 2019-03-19 DIAGNOSIS — E1142 Type 2 diabetes mellitus with diabetic polyneuropathy: Secondary | ICD-10-CM | POA: Diagnosis not present

## 2019-03-19 DIAGNOSIS — I509 Heart failure, unspecified: Secondary | ICD-10-CM | POA: Diagnosis not present

## 2019-03-19 DIAGNOSIS — E785 Hyperlipidemia, unspecified: Secondary | ICD-10-CM | POA: Diagnosis not present

## 2019-03-19 DIAGNOSIS — I6523 Occlusion and stenosis of bilateral carotid arteries: Secondary | ICD-10-CM | POA: Diagnosis not present

## 2019-03-19 DIAGNOSIS — Z125 Encounter for screening for malignant neoplasm of prostate: Secondary | ICD-10-CM | POA: Diagnosis not present

## 2019-03-19 DIAGNOSIS — F17211 Nicotine dependence, cigarettes, in remission: Secondary | ICD-10-CM | POA: Diagnosis not present

## 2019-03-19 DIAGNOSIS — E291 Testicular hypofunction: Secondary | ICD-10-CM | POA: Diagnosis not present

## 2019-03-19 DIAGNOSIS — I119 Hypertensive heart disease without heart failure: Secondary | ICD-10-CM | POA: Diagnosis not present

## 2019-03-19 DIAGNOSIS — I679 Cerebrovascular disease, unspecified: Secondary | ICD-10-CM | POA: Diagnosis not present

## 2019-03-19 DIAGNOSIS — E113293 Type 2 diabetes mellitus with mild nonproliferative diabetic retinopathy without macular edema, bilateral: Secondary | ICD-10-CM | POA: Diagnosis not present

## 2019-03-21 ENCOUNTER — Other Ambulatory Visit: Payer: Self-pay

## 2019-03-21 ENCOUNTER — Encounter: Payer: Self-pay | Admitting: Endocrinology

## 2019-03-21 ENCOUNTER — Ambulatory Visit (INDEPENDENT_AMBULATORY_CARE_PROVIDER_SITE_OTHER): Payer: Medicare Other | Admitting: Endocrinology

## 2019-03-21 VITALS — BP 130/70 | HR 63 | Ht 67.0 in | Wt 166.0 lb

## 2019-03-21 DIAGNOSIS — E1159 Type 2 diabetes mellitus with other circulatory complications: Secondary | ICD-10-CM

## 2019-03-21 LAB — POCT GLYCOSYLATED HEMOGLOBIN (HGB A1C): Hemoglobin A1C: 7.5 % — AB (ref 4.0–5.6)

## 2019-03-21 MED ORDER — RYBELSUS 3 MG PO TABS: 1.5000 mg | ORAL_TABLET | Freq: Every day | ORAL | 3 refills | Status: DC

## 2019-03-21 NOTE — Progress Notes (Signed)
Subjective:    Patient ID: [name], male    DOB: 1950/07/15, 69 y.o.   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  Dx'ed: 99991111 Complications: cerebrovascular disease.   Therapy: 5 oral meds.   DKA: never.  Severe hypoglycemia: never.  Pancreatitis: never.   Other: he can't take pioglitizone, due to edema; he has never taken insulin, but he has learned how; he is retired.   Interval history:  pt states he feels well in general.  He takes meds as rx'ed.  He says cbg varies from 97-150.   Past Medical History:  Diagnosis Date  . Arthritis    right shoulder  . BPH (benign prostatic hyperplasia)   . Hyperlipidemia   . Hypertension   . Impotence of organic origin   . Iron deficiency anemia   . Mild atherosclerosis of carotid artery, bilateral    last doppler 12-17-2010   bilateral ICA 1-39%  . Nocturia more than twice per night   . Testicular hypofunction   . Type 2 diabetes mellitus [facility])    endocrinologist-- dr Loanne Drilling  . Weak urinary stream   . Wears glasses     Past Surgical History:  Procedure Laterality Date  . CYSTOSCOPY WITH INSERTION OF UROLIFT N/A 12/12/2017   Procedure: CYSTOSCOPY WITH INSERTION OF UROLIFT;  Surgeon: Festus Aloe, MD;  Location: [facility];  Service: Urology;  Laterality: N/A;  . SHOULDER SURGERY Right 2004, 1990  . WISDOM TOOTH EXTRACTION      Social History   Socioeconomic History  . Marital status: Married    Spouse name: [name]  . Number of children: 0  . Years of education: Not on file  . Highest education level: Not on file  Occupational History    Employer: [employer]  Tobacco Use  . Smoking status: Never Smoker  . Smokeless tobacco: Never Used  Substance and Sexual Activity  . Alcohol use: Not Currently    Alcohol/week: 0.0 standard drinks  . Drug use: No  . Sexual activity: Yes  Other  Topics Concern  . Not on file  Social History Narrative   Regular exercise-yes   Caffeine-2 cups   Married=wife [name] 22 years   Cigar Smoker-quit smoking Jan 2010   Social Determinants of Health   Financial Resource Strain:   . Difficulty of Paying Living Expenses:   Food Insecurity:   . Worried About Charity fundraiser in the Last Year:   . Arboriculturist in the Last Year:   Transportation Needs:   . Film/video editor (Medical):   [name] Lack of Transportation (Non-Medical):   Physical Activity:   . Days of Exercise per Week:   . Minutes of Exercise per Session:   Stress:   . Feeling of Stress :   Social Connections:   . Frequency of Communication with Friends and Family:   . Frequency of Social Gatherings with Friends and Family:   . Attends Religious Services:   . Active Member of Clubs or Organizations:   . Attends Archivist Meetings:   [name] Marital Status:   Intimate Partner Violence:   . Fear of Current or Ex-Partner:   . Emotionally Abused:   [name] Physically Abused:   . Sexually Abused:     Current Outpatient Medications on File Prior to Visit  Medication Sig Dispense Refill  .  acarbose (PRECOSE) 25 MG tablet TAKE 1 TABLET THREE TIMES A DAY WITH MEALS 270 tablet 3  . acyclovir (ZOVIRAX) 800 MG tablet Take 800 mg by mouth daily as needed.     [name] aspirin 81 MG tablet Take 81 mg by mouth daily.      [name] atenolol (TENORMIN) 50 MG tablet TAKE 1 TABLET DAILY 90 tablet 3  . bromocriptine (PARLODEL) 2.5 MG tablet TAKE 1 TABLET AT BEDTIME 90 tablet 3  . clotrimazole-betamethasone (LOTRISONE) cream Apply 1 application topically at bedtime. To feet 45 g 3  . glucose blood (FREESTYLE LITE) test strip 1 each by Other route daily. Use to monitor glucose levels daily; E11.9 100 each 12  . IRON PO Take 1 tablet by mouth daily. Prescribed bid, taking qd    . JARDIANCE 25 MG TABS tablet TAKE 1 TABLET DAILY 90 tablet 3  . Lancets (FREESTYLE) lancets 1 each by Other route  daily. Use to monitor glucose levels daily; E11.9 100 each 2  . metFORMIN (GLUCOPHAGE-XR) 500 MG 24 hr tablet TAKE 2 TABLETS TWICE A DAY 360 tablet 3  . Multiple Vitamin (MULTIVITAMIN) tablet Take 1 tablet by mouth daily.      . repaglinide (PRANDIN) 2 MG tablet TAKE 1 TABLET TWICE A DAY BEFORE MEALS 180 tablet 3  . rosuvastatin (CRESTOR) 10 MG tablet TAKE 1 TABLET DAILY 90 tablet 3  . sildenafil (VIAGRA) 100 MG tablet Take 100 mg by mouth daily as needed. Use as directed      No current facility-administered medications on file prior to visit.    Allergies  Allergen Reactions  . Actos [Pioglitazone] Swelling    edema  . Trandolapril Swelling     w/ Angioedema  . Azithromycin Rash  . Other Rash    MAGIC MOUTHWASH: CAUSES RASH  . Tetracycline Rash    Family History  Problem Relation Age of Onset  . Heart disease Mother 60       Deceased  . Heart attack Mother   . Hypertension Mother   . Diabetes Father 63       Deceased  . Hypertension Father   . Heart disease Sister        heart trouble  . Heart attack Sister   . Diabetes Brother   . Heart attack Brother   . Cancer Paternal Aunt        x1-Bone  . Cancer Paternal Aunt        multiple  . Heart attack Maternal Grandmother   . Healthy Sister        x6  . Heart attack Sister   . Colon cancer Neg Hx     BP 130/70   Pulse 63   Ht 5\' 7"  (1.702 m)   Wt 166 lb (75.3 kg)   SpO2 99%   BMI 26.00 kg/m    Review of Systems He denies hypoglycemia    Objective:   Physical Exam VITAL SIGNS:  See vs page GENERAL: no distress Pulses: dorsalis pedis intact bilat.   MSK: no deformity of the feet.  CV: no leg edema.  Skin:  no ulcer on the feet.  normal color and temp on the feet.  Neuro: sensation is intact to touch on the feet.    Lab Results  Component Value Date   HGBA1C 7.5 (A) 03/21/2019   Lab Results  Component Value Date   CREATININE 1.01 01/22/2019   BUN 14 01/22/2019   NA 139 01/22/2019   K 4.3  01/22/2019   CL 100 01/22/2019   CO2 27 01/22/2019       Assessment & Plan:  Type 2 DM: worse.   Lean body habitus. We discussed the fact that pt is prob evolving type 1.  Reducing A1c can delay the need for insulin.    Patient Instructions  I have sent a prescription to your pharmacy, to add "Rybelsus." Please continue the same other diabetes medications.  check your blood sugar once a day.  vary the time of day when you check, between before the 3 meals, and at bedtime.  also check if you have symptoms of your blood sugar being too high or too low.  please keep a record of the readings and bring it to your next appointment here (or you can bring the meter itself).  You can write it on any piece of paper.  please call us sooner if your blood sugar goes below 70, or if you have a lot of readings over 200. Please come back for a follow-up appointment in 2-3 months.

## 2019-03-21 NOTE — Patient Instructions (Signed)
I have sent a prescription to your pharmacy, to add "Rybelsus." Please continue the same other diabetes medications.  check your blood sugar once a day.  vary the time of day when you check, between before the 3 meals, and at bedtime.  also check if you have symptoms of your blood sugar being too high or too low.  please keep a record of the readings and bring it to your next appointment here (or you can bring the meter itself).  You can write it on any piece of paper.  please call us sooner if your blood sugar goes below 70, or if you have a lot of readings over 200. Please come back for a follow-up appointment in 2-3 months.

## 2019-04-24 DIAGNOSIS — I119 Hypertensive heart disease without heart failure: Secondary | ICD-10-CM | POA: Diagnosis not present

## 2019-04-24 DIAGNOSIS — Z0000 Encounter for general adult medical examination without abnormal findings: Secondary | ICD-10-CM | POA: Diagnosis not present

## 2019-04-24 DIAGNOSIS — Z0001 Encounter for general adult medical examination with abnormal findings: Secondary | ICD-10-CM | POA: Diagnosis not present

## 2019-04-24 DIAGNOSIS — F17211 Nicotine dependence, cigarettes, in remission: Secondary | ICD-10-CM | POA: Diagnosis not present

## 2019-04-24 DIAGNOSIS — J309 Allergic rhinitis, unspecified: Secondary | ICD-10-CM | POA: Diagnosis not present

## 2019-04-24 DIAGNOSIS — E291 Testicular hypofunction: Secondary | ICD-10-CM | POA: Diagnosis not present

## 2019-04-24 DIAGNOSIS — I509 Heart failure, unspecified: Secondary | ICD-10-CM | POA: Diagnosis not present

## 2019-04-24 DIAGNOSIS — E113293 Type 2 diabetes mellitus with mild nonproliferative diabetic retinopathy without macular edema, bilateral: Secondary | ICD-10-CM | POA: Diagnosis not present

## 2019-04-24 DIAGNOSIS — E1142 Type 2 diabetes mellitus with diabetic polyneuropathy: Secondary | ICD-10-CM | POA: Diagnosis not present

## 2019-04-24 DIAGNOSIS — E785 Hyperlipidemia, unspecified: Secondary | ICD-10-CM | POA: Diagnosis not present

## 2019-04-24 DIAGNOSIS — Z008 Encounter for other general examination: Secondary | ICD-10-CM | POA: Diagnosis not present

## 2019-04-24 DIAGNOSIS — E663 Overweight: Secondary | ICD-10-CM | POA: Diagnosis not present

## 2019-05-14 ENCOUNTER — Other Ambulatory Visit: Payer: Self-pay | Admitting: Endocrinology

## 2019-05-14 ENCOUNTER — Telehealth: Payer: Self-pay | Admitting: Family Medicine

## 2019-05-14 MED ORDER — CLOTRIMAZOLE-BETAMETHASONE 1-0.05 % EX CREA: 1.0000 "application " | TOPICAL_CREAM | Freq: Every day | CUTANEOUS | 3 refills | Status: DC

## 2019-05-14 NOTE — Telephone Encounter (Signed)
Medication Refill Request  Did you call your pharmacy and request this refill first?    If patient has not contacted pharmacy first, instruct them to do so for future refills.   Remind them that contacting the pharmacy for their refill is the quickest method to get the refill.   Refill policy also stated that it will take anywhere between 24-72 hours to receive the refill.    Name of medication? clotrimazole-betamethasone (LOTRISONE) cream [id number]   Is this a 90 day supply? Same as last time  Name and location of pharmacy?   [facility] [place], [place] Phone:  [phone]  Fax:  [phone]

## 2019-05-14 NOTE — Telephone Encounter (Signed)
OK to refill. Ty.

## 2019-05-14 NOTE — Addendum Note (Signed)
Addended by: Sharon Seller B on: 05/14/2019 01:30 PM   Modules accepted: Orders

## 2019-05-14 NOTE — Telephone Encounter (Signed)
Please advise 

## 2019-05-14 NOTE — Telephone Encounter (Signed)
Please forward refill request to pt's primary care provider.   

## 2019-05-14 NOTE — Addendum Note (Signed)
Addended by: Sharon Seller B on: 05/14/2019 02:21 PM   Modules accepted: Orders

## 2019-05-14 NOTE — Telephone Encounter (Signed)
Refill done and called the patient to inform

## 2019-05-14 NOTE — Telephone Encounter (Signed)
Per Dr. Ellison's request, I am forwarding this refill request. Please review and refill if appropriate.  

## 2019-05-28 ENCOUNTER — Other Ambulatory Visit: Payer: Self-pay | Admitting: Family Medicine

## 2019-05-28 DIAGNOSIS — R948 Abnormal results of function studies of other organs and systems: Secondary | ICD-10-CM | POA: Diagnosis not present

## 2019-06-04 DIAGNOSIS — E291 Testicular hypofunction: Secondary | ICD-10-CM | POA: Diagnosis not present

## 2019-06-04 DIAGNOSIS — N401 Enlarged prostate with lower urinary tract symptoms: Secondary | ICD-10-CM | POA: Diagnosis not present

## 2019-06-04 DIAGNOSIS — R351 Nocturia: Secondary | ICD-10-CM | POA: Diagnosis not present

## 2019-07-02 ENCOUNTER — Other Ambulatory Visit: Payer: Self-pay

## 2019-07-02 ENCOUNTER — Ambulatory Visit (INDEPENDENT_AMBULATORY_CARE_PROVIDER_SITE_OTHER): Payer: Medicare Other | Admitting: Endocrinology

## 2019-07-02 ENCOUNTER — Encounter: Payer: Self-pay | Admitting: Endocrinology

## 2019-07-02 VITALS — BP 112/70 | HR 67 | Ht 67.0 in | Wt 164.8 lb

## 2019-07-02 DIAGNOSIS — E1159 Type 2 diabetes mellitus with other circulatory complications: Secondary | ICD-10-CM

## 2019-07-02 LAB — POCT GLYCOSYLATED HEMOGLOBIN (HGB A1C): Hemoglobin A1C: 8.1 % — AB (ref 4.0–5.6)

## 2019-07-02 MED ORDER — RYBELSUS 3 MG PO TABS: 1.5000 mg | ORAL_TABLET | Freq: Every day | ORAL | 3 refills | Status: DC

## 2019-07-02 NOTE — Progress Notes (Signed)
Subjective:    Patient ID: [name], male    DOB: 05-08-1950, 69 y.o.   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  Dx'ed: 3419 Complications: cerebrovascular disease.   Therapy: 6 oral meds.   DKA: never.  Severe hypoglycemia: never.  Pancreatitis: never.   Other: he can't take pioglitizone, due to edema; he has never taken insulin, but he has learned how; he is retired.   Interval history:  pt states he feels well in general.  He did not start Rybelsus.  He says cbg varies from 97-150. He seldom has hypoglycemia, and these episodes are mild.  Past Medical History:  Diagnosis Date  . Arthritis    right shoulder  . BPH (benign prostatic hyperplasia)   . Hyperlipidemia   . Hypertension   . Impotence of organic origin   . Iron deficiency anemia   . Mild atherosclerosis of carotid artery, bilateral    last doppler 12-17-2010   bilateral ICA 1-39%  . Nocturia more than twice per night   . Testicular hypofunction   . Type 2 diabetes mellitus [facility])    endocrinologist-- dr Loanne Drilling  . Weak urinary stream   . Wears glasses     Past Surgical History:  Procedure Laterality Date  . CYSTOSCOPY WITH INSERTION OF UROLIFT N/A 12/12/2017   Procedure: CYSTOSCOPY WITH INSERTION OF UROLIFT;  Surgeon: Festus Aloe, MD;  Location: [facility];  Service: Urology;  Laterality: N/A;  . SHOULDER SURGERY Right 2004, 1990  . WISDOM TOOTH EXTRACTION      Social History   Socioeconomic History  . Marital status: Married    Spouse name: [name]  . Number of children: 0  . Years of education: Not on file  . Highest education level: Not on file  Occupational History    Employer: [employer]  Tobacco Use  . Smoking status: Never Smoker  . Smokeless tobacco: Never Used  Vaping Use  . Vaping Use: Never used  Substance and Sexual Activity  . Alcohol use:  Not Currently    Alcohol/week: 0.0 standard drinks  . Drug use: No  . Sexual activity: Yes  Other Topics Concern  . Not on file  Social History Narrative   Regular exercise-yes   Caffeine-2 cups   Married=wife [name] 22 years   Cigar Smoker-quit smoking Jan 2010   Social Determinants of Health   Financial Resource Strain:   . Difficulty of Paying Living Expenses:   Food Insecurity:   . Worried About Charity fundraiser in the Last Year:   . Arboriculturist in the Last Year:   Transportation Needs:   . Film/video editor (Medical):   [name] Lack of Transportation (Non-Medical):   Physical Activity:   . Days of Exercise per Week:   . Minutes of Exercise per Session:   Stress:   . Feeling of Stress :   Social Connections:   . Frequency of Communication with Friends and Family:   . Frequency of Social Gatherings with Friends and Family:   . Attends Religious Services:   . Active Member of Clubs or Organizations:   . Attends Archivist Meetings:   [name] Marital Status:   Intimate Partner Violence:   . Fear of Current or Ex-Partner:   . Emotionally Abused:   [name] Physically Abused:   . Sexually Abused:  Current Outpatient Medications on File Prior to Visit  Medication Sig Dispense Refill  . acarbose (PRECOSE) 25 MG tablet TAKE 1 TABLET THREE TIMES A DAY WITH MEALS (Patient taking differently: Take 25 mg by mouth 2 (two) times daily. ) 270 tablet 3  . acyclovir (ZOVIRAX) 800 MG tablet Take 800 mg by mouth daily as needed.     [name] aspirin 81 MG tablet Take 81 mg by mouth daily.      [name] atenolol (TENORMIN) 50 MG tablet TAKE 1 TABLET DAILY 90 tablet 3  . bromocriptine (PARLODEL) 2.5 MG tablet TAKE 1 TABLET AT BEDTIME 90 tablet 3  . clotrimazole-betamethasone (LOTRISONE) cream Apply 1 application topically at bedtime. To feet 45 g 3  . glucose blood (FREESTYLE LITE) test strip 1 each by Other route daily. Use to monitor glucose levels daily; E11.9 100 each 12  . IRON PO Take 1  tablet by mouth daily. Prescribed bid, taking qd    . JARDIANCE 25 MG TABS tablet TAKE 1 TABLET DAILY 90 tablet 3  . Lancets (FREESTYLE) lancets 1 each by Other route daily. Use to monitor glucose levels daily; E11.9 100 each 2  . metFORMIN (GLUCOPHAGE-XR) 500 MG 24 hr tablet TAKE 2 TABLETS TWICE A DAY 360 tablet 3  . Multiple Vitamin (MULTIVITAMIN) tablet Take 1 tablet by mouth daily.      . repaglinide (PRANDIN) 2 MG tablet TAKE 1 TABLET TWICE A DAY BEFORE MEALS 180 tablet 3  . rosuvastatin (CRESTOR) 10 MG tablet TAKE 1 TABLET DAILY 90 tablet 3  . sildenafil (VIAGRA) 100 MG tablet Take 100 mg by mouth daily as needed. Use as directed      No current facility-administered medications on file prior to visit.    Allergies  Allergen Reactions  . Actos [Pioglitazone] Swelling    edema  . Trandolapril Swelling     w/ Angioedema  . Azithromycin Rash  . Other Rash    MAGIC MOUTHWASH: CAUSES RASH  . Tetracycline Rash    Family History  Problem Relation Age of Onset  . Heart disease Mother 37       Deceased  . Heart attack Mother   . Hypertension Mother   . Diabetes Father 6       Deceased  . Hypertension Father   . Heart disease Sister        heart trouble  . Heart attack Sister   . Diabetes Brother   . Heart attack Brother   . Cancer Paternal Aunt        x1-Bone  . Cancer Paternal Aunt        multiple  . Heart attack Maternal Grandmother   . Healthy Sister        x6  . Heart attack Sister   . Colon cancer Neg Hx     BP 112/70   Pulse 67   Ht 5\' 7"  (1.702 m)   Wt 164 lb 12.8 oz (74.8 kg)   SpO2 97%   BMI 25.81 kg/m    Review of Systems Denies LOC.      Objective:   Physical Exam VITAL SIGNS:  See vs page GENERAL: no distress Pulses: dorsalis pedis intact bilat.   MSK: no deformity of the feet CV: no leg edema Skin:  no ulcer on the feet.  normal color and temp on the feet. Neuro: sensation is intact to touch on the feet  A1c=8.1%     Assessment &  Plan:  Type 2 DM,  with CVD: worse.   Patient Instructions  I have sent a prescription to your pharmacy, for the "Rybelsus." Please continue the same other diabetes medications.  check your blood sugar once a day.  vary the time of day when you check, between before the 3 meals, and at bedtime.  also check if you have symptoms of your blood sugar being too high or too low.  please keep a record of the readings and bring it to your next appointment here (or you can bring the meter itself).  You can write it on any piece of paper.  please call us sooner if your blood sugar goes below 70, or if you have a lot of readings over 200. Please come back for a follow-up appointment in 2 months.

## 2019-07-02 NOTE — Patient Instructions (Addendum)
I have sent a prescription to your pharmacy, for the "Rybelsus." Please continue the same other diabetes medications.  check your blood sugar once a day.  vary the time of day when you check, between before the 3 meals, and at bedtime.  also check if you have symptoms of your blood sugar being too high or too low.  please keep a record of the readings and bring it to your next appointment here (or you can bring the meter itself).  You can write it on any piece of paper.  please call us sooner if your blood sugar goes below 70, or if you have a lot of readings over 200. Please come back for a follow-up appointment in 2 months.

## 2019-07-04 ENCOUNTER — Telehealth: Payer: Self-pay

## 2019-07-04 NOTE — Telephone Encounter (Signed)
PRIOR AUTHORIZATION  PA initiation date: 07/04/19  Medication: [place]: Furniture conservator/restorer completed electronically through [employer] My Meds: No Form faxed to [employer]: Yes  Will await insurance response re: approval/denial.

## 2019-07-09 NOTE — Telephone Encounter (Signed)
APPROVAL  Medication: [place]: [employer] response: Approved Approval dates: Until 01/03/2020  Document has been labeled and placed in scan file for HIM and for our future reference.

## 2019-07-23 ENCOUNTER — Other Ambulatory Visit: Payer: Self-pay | Admitting: Family Medicine

## 2019-07-23 DIAGNOSIS — E1159 Type 2 diabetes mellitus with other circulatory complications: Secondary | ICD-10-CM

## 2019-07-24 DIAGNOSIS — E1151 Type 2 diabetes mellitus with diabetic peripheral angiopathy without gangrene: Secondary | ICD-10-CM | POA: Diagnosis not present

## 2019-07-24 DIAGNOSIS — J309 Allergic rhinitis, unspecified: Secondary | ICD-10-CM | POA: Diagnosis not present

## 2019-07-24 DIAGNOSIS — I119 Hypertensive heart disease without heart failure: Secondary | ICD-10-CM | POA: Diagnosis not present

## 2019-07-24 DIAGNOSIS — E1142 Type 2 diabetes mellitus with diabetic polyneuropathy: Secondary | ICD-10-CM | POA: Diagnosis not present

## 2019-07-24 DIAGNOSIS — E113293 Type 2 diabetes mellitus with mild nonproliferative diabetic retinopathy without macular edema, bilateral: Secondary | ICD-10-CM | POA: Diagnosis not present

## 2019-07-24 DIAGNOSIS — N529 Male erectile dysfunction, unspecified: Secondary | ICD-10-CM | POA: Diagnosis not present

## 2019-07-24 DIAGNOSIS — E663 Overweight: Secondary | ICD-10-CM | POA: Diagnosis not present

## 2019-07-24 DIAGNOSIS — E291 Testicular hypofunction: Secondary | ICD-10-CM | POA: Diagnosis not present

## 2019-07-24 DIAGNOSIS — Z008 Encounter for other general examination: Secondary | ICD-10-CM | POA: Diagnosis not present

## 2019-07-24 DIAGNOSIS — R011 Cardiac murmur, unspecified: Secondary | ICD-10-CM | POA: Diagnosis not present

## 2019-07-24 DIAGNOSIS — E785 Hyperlipidemia, unspecified: Secondary | ICD-10-CM | POA: Diagnosis not present

## 2019-07-24 DIAGNOSIS — R0989 Other specified symptoms and signs involving the circulatory and respiratory systems: Secondary | ICD-10-CM | POA: Diagnosis not present

## 2019-08-05 ENCOUNTER — Telehealth: Payer: Self-pay | Admitting: Endocrinology

## 2019-08-05 NOTE — Telephone Encounter (Signed)
Please refer to Dr.'s Dwyane Dee and Loanne Drilling response below

## 2019-08-05 NOTE — Telephone Encounter (Signed)
OK 

## 2019-08-05 NOTE — Telephone Encounter (Signed)
Patient called to request to change Provider from Dr. Loanne Drilling to Dr. Dwyane Dee due to patient feels that Dr. Loanne Drilling prescribes too much medication.

## 2019-08-05 NOTE — Telephone Encounter (Signed)
Okay to switch, will need initial 45-minute visit

## 2019-08-06 DIAGNOSIS — L821 Other seborrheic keratosis: Secondary | ICD-10-CM | POA: Diagnosis not present

## 2019-08-06 DIAGNOSIS — S30861D Insect bite (nonvenomous) of abdominal wall, subsequent encounter: Secondary | ICD-10-CM | POA: Diagnosis not present

## 2019-08-06 DIAGNOSIS — L720 Epidermal cyst: Secondary | ICD-10-CM | POA: Diagnosis not present

## 2019-08-07 DIAGNOSIS — E119 Type 2 diabetes mellitus without complications: Secondary | ICD-10-CM | POA: Diagnosis not present

## 2019-08-07 DIAGNOSIS — H40013 Open angle with borderline findings, low risk, bilateral: Secondary | ICD-10-CM | POA: Diagnosis not present

## 2019-08-07 DIAGNOSIS — H2513 Age-related nuclear cataract, bilateral: Secondary | ICD-10-CM | POA: Diagnosis not present

## 2019-09-02 LAB — HM DIABETES EYE EXAM

## 2019-09-03 ENCOUNTER — Ambulatory Visit: Payer: Medicare Other | Attending: Critical Care Medicine

## 2019-09-03 DIAGNOSIS — Z23 Encounter for immunization: Secondary | ICD-10-CM

## 2019-09-03 NOTE — Progress Notes (Signed)
   Covid-19 Vaccination Clinic  Name:  Jakel Alphin    MRN: [id number] DOB: 1950/04/01  09/03/2019  Mr. Gastineau was observed post Covid-19 immunization for 15 minutes without incident. He was provided with Vaccine Information Sheet and instruction to access the V-Safe system.   Mr. Marsalis was instructed to call 911 with any severe reactions post vaccine: [name] Difficulty breathing  . Swelling of face and throat  . A fast heartbeat  . A bad rash all over body  . Dizziness and weakness

## 2019-09-04 ENCOUNTER — Encounter: Payer: Self-pay | Admitting: Endocrinology

## 2019-09-04 ENCOUNTER — Other Ambulatory Visit: Payer: Self-pay

## 2019-09-04 ENCOUNTER — Ambulatory Visit (INDEPENDENT_AMBULATORY_CARE_PROVIDER_SITE_OTHER): Payer: Medicare Other | Admitting: Endocrinology

## 2019-09-04 ENCOUNTER — Encounter: Payer: Self-pay | Admitting: *Deleted

## 2019-09-04 VITALS — BP 122/78 | HR 66 | Ht 67.0 in | Wt 166.4 lb

## 2019-09-04 DIAGNOSIS — E785 Hyperlipidemia, unspecified: Secondary | ICD-10-CM | POA: Diagnosis not present

## 2019-09-04 DIAGNOSIS — I10 Essential (primary) hypertension: Secondary | ICD-10-CM

## 2019-09-04 DIAGNOSIS — E1165 Type 2 diabetes mellitus with hyperglycemia: Secondary | ICD-10-CM | POA: Diagnosis not present

## 2019-09-04 LAB — URINALYSIS, ROUTINE W REFLEX MICROSCOPIC
Bilirubin Urine: NEGATIVE
Hgb urine dipstick: NEGATIVE
Ketones, ur: NEGATIVE
Leukocytes,Ua: NEGATIVE
Nitrite: NEGATIVE
RBC / HPF: NONE SEEN (ref 0–?)
Specific Gravity, Urine: 1.015 (ref 1.000–1.030)
Total Protein, Urine: NEGATIVE
Urine Glucose: >=1000 — AB
Urobilinogen, UA: 0.2 (ref 0.0–1.0)
WBC, UA: NONE SEEN (ref 0–?)
pH: 5.5 (ref 5.0–8.0)

## 2019-09-04 LAB — COMPREHENSIVE METABOLIC PANEL
ALT: 38 U/L (ref 0–53)
AST: 31 U/L (ref 0–37)
Albumin: 4.3 g/dL (ref 3.5–5.2)
Alkaline Phosphatase: 73 U/L (ref 39–117)
BUN: 11 mg/dL (ref 6–23)
CO2: 27 mEq/L (ref 19–32)
Calcium: 9.1 mg/dL (ref 8.4–10.5)
Chloride: 100 mEq/L (ref 96–112)
Creatinine, Ser: 0.93 mg/dL (ref 0.40–1.50)
GFR: 97.79 mL/min (ref >60.00)
Glucose, Bld: 116 mg/dL — ABNORMAL HIGH (ref 70–99)
Potassium: 4 mEq/L (ref 3.5–5.1)
Sodium: 135 mEq/L (ref 135–145)
Total Bilirubin: 0.7 mg/dL (ref 0.2–1.2)
Total Protein: 6.8 g/dL (ref 6.0–8.3)

## 2019-09-04 LAB — LIPID PANEL
Cholesterol: 118 mg/dL (ref 0–200)
HDL: 41.7 mg/dL (ref >39.00)
LDL Cholesterol: 59 mg/dL (ref 0–99)
NonHDL: 76.35
Total CHOL/HDL Ratio: 3
Triglycerides: 86 mg/dL (ref 0.0–149.0)
VLDL: 17.2 mg/dL (ref 0.0–40.0)

## 2019-09-04 LAB — MICROALBUMIN / CREATININE URINE RATIO
Creatinine,U: 91.5 mg/dL
Microalb Creat Ratio: 0.8 mg/g (ref 0.0–30.0)
Microalb, Ur: <0.7 mg/dL (ref 0.0–1.9)

## 2019-09-04 MED ORDER — RYBELSUS 7 MG PO TABS: 1.0000 | ORAL_TABLET | Freq: Every day | ORAL | 3 refills | Status: DC

## 2019-09-04 NOTE — Progress Notes (Signed)
Patient ID: [name], male   DOB: December 24, 1950, 69 y.o.   MRN: [id number]           Reason for Appointment: Consultation for Type 2 Diabetes   History of Present Illness:          Date of diagnosis of type 2 diabetes mellitus:    1996     Background history:  Initially was on Metformin and possibly other medication but details are not available His A1c since 2016 has ranged between 6 and 8.1, mostly in the low 7 range He was started on Invokana in 2017 and also was given acarbose and Prandin in 2018 in addition He has also been prescribed bromocriptine to take at bedtime  Recent history:   Most recent A1c is 8.1 done on 07/02/2019   Non-insulin hypoglycemic drugs the patient is taking are: Precose, Prandin, bromocriptine, Rybelsus 1.5 mg, Jardiance 25 mg, Metformin 2 g  Current management, blood sugar patterns and problems identified:  He checks blood sugars only in the mornings and did not bring his meter  He is concerned about his A1c being higher and the highest that he has had in the last few years  He thinks his blood sugars are slightly higher than last year, previously averaging about 120 in the morning  Does not check readings after meals  Also is concerned about multiple medications  Currently taking only 1.5 mg Rybelsus that was started in March 2021; although he had some nausea in the first week has not had any subsequently  He takes this 30-minute before breakfast  Currently because of hot weather he has not done any exercise  He generally eats 2 meals a day and usually avoiding a lot of meat, fried food and snacks  Also avoiding regular soft drinks or juices  His weight generally stays about the same          Side effects from medications have been: None  Typical meal intake: Breakfast is oatmeal or eggs and toast.  Lunch usually fruit.  Dinner is usually vegetables, usually avoiding fried food and carbohydrates like rice and pasta                Exercise:  Minimal  Glucose monitoring:  done 1 times a day         Glucometer:  Freestyle lite.       Blood Glucose readings by recall:  PREMEAL Breakfast Lunch Dinner Bedtime  Overall   Glucose range: 130-140      Median:         Dietician visit, most recent: None few years ago  Weight history: Usually has not had much fluctuation in his weight  Wt Readings from Last 3 Encounters:  09/04/19 166 lb 6.4 oz (75.5 kg)  07/02/19 164 lb 12.8 oz (74.8 kg)  03/21/19 166 lb (75.3 kg)    Glycemic control:   Lab Results  Component Value Date   HGBA1C 8.1 (A) 07/02/2019   HGBA1C 7.5 (A) 03/21/2019   HGBA1C 7.3 (A) 12/21/2018   Lab Results  Component Value Date   MICROALBUR <0.7 09/04/2019   LDLCALC 59 09/04/2019   CREATININE 0.93 09/04/2019   Lab Results  Component Value Date   MICRALBCREAT 0.8 09/04/2019    No results found for: FRUCTOSAMINE  Abstract on 09/04/2019  Component Date Value Ref Range Status  . HM Diabetic Eye Exam 09/02/2019 Retinopathy* No Retinopathy Final  Office Visit on 09/04/2019  Component Date Value Ref Range Status  .  Microalb, Ur 09/04/2019 <0.7  0.0 - 1.9 mg/dL Final  . Creatinine,U 09/04/2019 91.5  mg/dL Final  . Microalb Creat Ratio 09/04/2019 0.8  0.0 - 30.0 mg/g Final  . Color, Urine 09/04/2019 YELLOW  Yellow;Lt. Yellow;Straw;Dark Yellow;Amber;Green;Red;Brown Final  . APPearance 09/04/2019 CLEAR  Clear;Turbid;Slightly Cloudy;Cloudy Final  . Specific Gravity, Urine 09/04/2019 1.015  1.000 - 1.030 Final  . pH 09/04/2019 5.5  5.0 - 8.0 Final  . Total Protein, Urine 09/04/2019 NEGATIVE  Negative Final  . Urine Glucose 09/04/2019 >=1000* Negative Final  . Ketones, ur 09/04/2019 NEGATIVE  Negative Final  . Bilirubin Urine 09/04/2019 NEGATIVE  Negative Final  . Hgb urine dipstick 09/04/2019 NEGATIVE  Negative Final  . Urobilinogen, UA 09/04/2019 0.2  0.0 - 1.0 Final  . Leukocytes,Ua 09/04/2019 NEGATIVE  Negative Final  . Nitrite 09/04/2019  NEGATIVE  Negative Final  . WBC, UA 09/04/2019 none seen  0-2/hpf Final  . RBC / HPF 09/04/2019 none seen  0-2/hpf Final  . Cholesterol 09/04/2019 118  0 - 200 mg/dL Final   ATP III Classification       Desirable:  < 200 mg/dL               Borderline High:  200 - 239 mg/dL          High:  > = 240 mg/dL  . Triglycerides 09/04/2019 86.0  0 - 149 mg/dL Final   Normal:  <150 mg/dLBorderline High:  150 - 199 mg/dL  . HDL 09/04/2019 41.70  >39.00 mg/dL Final  . VLDL 09/04/2019 17.2  0.0 - 40.0 mg/dL Final  . LDL Cholesterol 09/04/2019 59  0 - 99 mg/dL Final  . Total CHOL/HDL Ratio 09/04/2019 3   Final                  Men          Women1/2 Average Risk     3.4          3.3Average Risk          5.0          4.42X Average Risk          9.6          7.13X Average Risk          15.0          11.0                      . NonHDL 09/04/2019 76.35   Final   NOTE:  Non-HDL goal should be 30 mg/dL higher than patient's LDL goal (i.e. LDL goal of < 70 mg/dL, would have non-HDL goal of < 100 mg/dL)  . Sodium 09/04/2019 135  135 - 145 mEq/L Final  . Potassium 09/04/2019 4.0  3.5 - 5.1 mEq/L Final  . Chloride 09/04/2019 100  96 - 112 mEq/L Final  . CO2 09/04/2019 27  19 - 32 mEq/L Final  . Glucose, Bld 09/04/2019 116* 70 - 99 mg/dL Final  . BUN 09/04/2019 11  6 - 23 mg/dL Final  . Creatinine, Ser 09/04/2019 0.93  0.40 - 1.50 mg/dL Final  . Total Bilirubin 09/04/2019 0.7  0.2 - 1.2 mg/dL Final  . Alkaline Phosphatase 09/04/2019 73  39 - 117 U/L Final  . AST 09/04/2019 31  0 - 37 U/L Final  . ALT 09/04/2019 38  0 - 53 U/L Final  . Total Protein 09/04/2019 6.8  6.0 - 8.3 g/dL Final  .  Albumin 09/04/2019 4.3  3.5 - 5.2 g/dL Final  . GFR 09/04/2019 97.79  >60.00 mL/min Final  . Calcium 09/04/2019 9.1  8.4 - 10.5 mg/dL Final    Allergies as of 09/04/2019      Reactions   Actos [pioglitazone] Swelling   edema   Trandolapril Swelling    w/ Angioedema   Azithromycin Rash   Other Rash   MAGIC MOUTHWASH:  CAUSES RASH   Tetracycline Rash      Medication List       Accurate as of September 04, 2019  4:24 PM. If you have any questions, ask your nurse or doctor.        STOP taking these medications   acarbose 25 MG tablet Commonly known as: PRECOSE Stopped by: Elayne Snare, MD   bromocriptine 2.5 MG tablet Commonly known as: PARLODEL Stopped by: Elayne Snare, MD   repaglinide 2 MG tablet Commonly known as: PRANDIN Stopped by: Elayne Snare, MD     TAKE these medications   acyclovir 800 MG tablet Commonly known as: ZOVIRAX Take 800 mg by mouth daily as needed.   aspirin 81 MG tablet Take 81 mg by mouth daily.   atenolol 50 MG tablet Commonly known as: TENORMIN TAKE 1 TABLET DAILY   clotrimazole-betamethasone cream Commonly known as: LOTRISONE Apply 1 application topically at bedtime. To feet   freestyle lancets 1 each by Other route daily. Use to monitor glucose levels daily; E11.9   FREESTYLE LITE test strip Generic drug: glucose blood 1 each by Other route daily. Use to monitor glucose levels daily; E11.9   IRON PO Take 1 tablet by mouth daily. Prescribed bid, taking qd   Jardiance 25 MG Tabs tablet Generic drug: empagliflozin TAKE 1 TABLET DAILY   metFORMIN 500 MG 24 hr tablet Commonly known as: GLUCOPHAGE-XR TAKE 2 TABLETS TWICE A DAY   multivitamin tablet Take 1 tablet by mouth daily.   rosuvastatin 10 MG tablet Commonly known as: CRESTOR TAKE 1 TABLET DAILY   Rybelsus 7 MG Tabs Generic drug: Semaglutide Take 1 tablet by mouth daily before breakfast. Take 30 minutes before breakfast with water What changed:   medication strength  how much to take  when to take this  additional instructions Changed by: Elayne Snare, MD   sildenafil 100 MG tablet Commonly known as: VIAGRA Take 100 mg by mouth daily as needed. Use as directed       Allergies:  Allergies  Allergen Reactions  . Actos [Pioglitazone] Swelling    edema  . Trandolapril Swelling       w/ Angioedema  . Azithromycin Rash  . Other Rash    MAGIC MOUTHWASH: CAUSES RASH  . Tetracycline Rash    Past Medical History:  Diagnosis Date  . Arthritis    right shoulder  . BPH (benign prostatic hyperplasia)   . Hyperlipidemia   . Hypertension   . Impotence of organic origin   . Iron deficiency anemia   . Mild atherosclerosis of carotid artery, bilateral    last doppler 12-17-2010   bilateral ICA 1-39%  . Nocturia more than twice per night   . Testicular hypofunction   . Type 2 diabetes mellitus [facility])    endocrinologist-- dr Loanne Drilling  . Weak urinary stream   . Wears glasses     Past Surgical History:  Procedure Laterality Date  . CYSTOSCOPY WITH INSERTION OF UROLIFT N/A 12/12/2017   Procedure: CYSTOSCOPY WITH INSERTION OF UROLIFT;  Surgeon: Festus Aloe, MD;  Location:  [facility];  Service: Urology;  Laterality: N/A;  . SHOULDER SURGERY Right 2004, 1990  . WISDOM TOOTH EXTRACTION      Family History  Problem Relation Age of Onset  . Heart disease Mother 50       Deceased  . Heart attack Mother   . Hypertension Mother   . Diabetes Father 20       Deceased  . Hypertension Father   . Heart disease Sister        heart trouble  . Heart attack Sister   . Diabetes Brother   . Heart attack Brother   . Cancer Paternal Aunt        x1-Bone  . Cancer Paternal Aunt        multiple  . Heart attack Maternal Grandmother   . Healthy Sister        x6  . Heart attack Sister   . Colon cancer Neg Hx     Social History:  reports that he has never smoked. He has never used smokeless tobacco. He reports previous alcohol use. He reports that he does not use drugs.   Review of Systems  Constitutional: Negative for weight loss.  Eyes: Negative for blurred vision.  Respiratory: Negative for shortness of breath.   Cardiovascular: Negative for chest pain.       Occasionally he thinks his feet will swell  Gastrointestinal: Negative for vomiting and  constipation.  Endocrine: Negative for fatigue.  Genitourinary: Positive for nocturia.  Musculoskeletal: Negative for joint pain.  Skin:       He has some itching areas on his feet  Neurological: Negative for numbness.     Lipid history: Being treated with rosuvastatin 10 mg by PCP, labs as follows    Lab Results  Component Value Date   CHOL 118 09/04/2019   HDL 41.70 09/04/2019   LDLCALC 59 09/04/2019   LDLDIRECT 146.5 10/29/2012   TRIG 86.0 09/04/2019   CHOLHDL 3 09/04/2019           Hypertension: Has been treated with atenolol only  BP Readings from Last 3 Encounters:  09/04/19 122/78  07/02/19 112/70  03/21/19 130/70    Most recent eye exam was in 8/21, has had stable background retinopathy  Most recent foot exam: 9/21  Currently known complications of diabetes: Background retinopathy  LABS:  Abstract on 09/04/2019  Component Date Value Ref Range Status  . HM Diabetic Eye Exam 09/02/2019 Retinopathy* No Retinopathy Final  Office Visit on 09/04/2019  Component Date Value Ref Range Status  . Microalb, Ur 09/04/2019 <0.7  0.0 - 1.9 mg/dL Final  . Creatinine,U 09/04/2019 91.5  mg/dL Final  . Microalb Creat Ratio 09/04/2019 0.8  0.0 - 30.0 mg/g Final  . Color, Urine 09/04/2019 YELLOW  Yellow;Lt. Yellow;Straw;Dark Yellow;Amber;Green;Red;Brown Final  . APPearance 09/04/2019 CLEAR  Clear;Turbid;Slightly Cloudy;Cloudy Final  . Specific Gravity, Urine 09/04/2019 1.015  1.000 - 1.030 Final  . pH 09/04/2019 5.5  5.0 - 8.0 Final  . Total Protein, Urine 09/04/2019 NEGATIVE  Negative Final  . Urine Glucose 09/04/2019 >=1000* Negative Final  . Ketones, ur 09/04/2019 NEGATIVE  Negative Final  . Bilirubin Urine 09/04/2019 NEGATIVE  Negative Final  . Hgb urine dipstick 09/04/2019 NEGATIVE  Negative Final  . Urobilinogen, UA 09/04/2019 0.2  0.0 - 1.0 Final  . Leukocytes,Ua 09/04/2019 NEGATIVE  Negative Final  . Nitrite 09/04/2019 NEGATIVE  Negative Final  . WBC, UA  09/04/2019 none seen  0-2/hpf Final  .  RBC / HPF 09/04/2019 none seen  0-2/hpf Final  . Cholesterol 09/04/2019 118  0 - 200 mg/dL Final   ATP III Classification       Desirable:  < 200 mg/dL               Borderline High:  200 - 239 mg/dL          High:  > = 240 mg/dL  . Triglycerides 09/04/2019 86.0  0 - 149 mg/dL Final   Normal:  <150 mg/dLBorderline High:  150 - 199 mg/dL  . HDL 09/04/2019 41.70  >39.00 mg/dL Final  . VLDL 09/04/2019 17.2  0.0 - 40.0 mg/dL Final  . LDL Cholesterol 09/04/2019 59  0 - 99 mg/dL Final  . Total CHOL/HDL Ratio 09/04/2019 3   Final                  Men          Women1/2 Average Risk     3.4          3.3Average Risk          5.0          4.42X Average Risk          9.6          7.13X Average Risk          15.0          11.0                      . NonHDL 09/04/2019 76.35   Final   NOTE:  Non-HDL goal should be 30 mg/dL higher than patient's LDL goal (i.e. LDL goal of < 70 mg/dL, would have non-HDL goal of < 100 mg/dL)  . Sodium 09/04/2019 135  135 - 145 mEq/L Final  . Potassium 09/04/2019 4.0  3.5 - 5.1 mEq/L Final  . Chloride 09/04/2019 100  96 - 112 mEq/L Final  . CO2 09/04/2019 27  19 - 32 mEq/L Final  . Glucose, Bld 09/04/2019 116* 70 - 99 mg/dL Final  . BUN 09/04/2019 11  6 - 23 mg/dL Final  . Creatinine, Ser 09/04/2019 0.93  0.40 - 1.50 mg/dL Final  . Total Bilirubin 09/04/2019 0.7  0.2 - 1.2 mg/dL Final  . Alkaline Phosphatase 09/04/2019 73  39 - 117 U/L Final  . AST 09/04/2019 31  0 - 37 U/L Final  . ALT 09/04/2019 38  0 - 53 U/L Final  . Total Protein 09/04/2019 6.8  6.0 - 8.3 g/dL Final  . Albumin 09/04/2019 4.3  3.5 - 5.2 g/dL Final  . GFR 09/04/2019 97.79  >60.00 mL/min Final  . Calcium 09/04/2019 9.1  8.4 - 10.5 mg/dL Final    Physical Examination:  BP 122/78 (BP Location: Left Arm, Patient Position: Sitting)   Pulse 66   Ht 5\' 7"  (1.702 m)   Wt 166 lb 6.4 oz (75.5 kg)   SpO2 96%   BMI 26.06 kg/m   GENERAL:    Is averagely built and  nourished    HEENT:         Eye exam shows normal external appearance.  Fundus exam deferred to ophthalmologist Oral exam not indicated   NECK:   There is no lymphadenopathy  Thyroid is not enlarged and no nodules felt.   Carotids are normal to palpation and no bruit heard  LUNGS:         Chest is symmetrical. Lungs are  clear to auscultation.[name]   HEART:         Heart sounds:  S1 and S2 are normal. No murmur or click heard., no S3 or S4.    ABDOMEN:   There is no distention present. Liver and spleen are not palpable.  No other mass or tenderness present.    NEUROLOGICAL:    Diabetic Foot Exam - Simple   Simple Foot Form Diabetic Foot exam was performed with the following findings: Yes   Visual Inspection No deformities, no ulcerations, no other skin breakdown bilaterally: Yes Sensation Testing Intact to touch and monofilament testing bilaterally: Yes Pulse Check Posterior Tibialis and Dorsalis pulse intact bilaterally: Yes Comments           Vibration sense is nearly normal in distal first toes.  MUSCULOSKELETAL:  There is no swelling or deformity of the peripheral joints.     EXTREMITIES:     There is no ankle edema.  SKIN:       No rash or lesions of concern.         ASSESSMENT:  Diabetes type 2 non-insulin-dependent  See history of present illness for detailed discussion of current diabetes management, blood sugar patterns and problems identified  Most recent A1c is 8.1 Likely has mostly postprandial hyperglycemia since he does not seem to have high fasting readings Currently not monitoring after meals Although his lifestyle is fairly good he is not exercising and likely can have better balanced meals Background of type 2 diabetes etiology, management, goals and treatment options were discussed  Current treatment regimen is multiple medications including Metformin, Jardiance and only 1.5 mg Rybelsus   Complications of diabetes: Background retinopathy, needs  assessment of microalbumin which is overdue No signs or symptoms of neuropathy  HYPERCHOLESTEROLEMIA: No recent labs available  HYPERTENSION: Currently only on beta-blocker and not taking an ACE inhibitor   PLAN:    . Glucose monitoring: Patient advised to check readings either fasting or 2 hours after meals instead of just fasting, he will also need to bring his monitor for download on each visit  . Diabetes education: Patient will benefit from meal planning education and this will be scheduled  . Lifestyle changes: He does need to start exercising with either walking outside or going back to the gym   . Therapy changes:  His treatment regimen needs to be simplified He will stop acarbose, Prandin and bromocriptine RYBELSUS will need to be increased to therapeutic levels  Discussed with the patient the nature of GLP-1 drugs, the actions on insulin secretion, slowing stomach emptying, reduction of appetite and reduced liver glucose production Explained that Rybelsus improves blood sugar control as well as produces weight loss and reduces cardiovascular events.   He will increase his current regimen to 3 mg daily for now After he finishes his supply at home I will go to the 7 mg prescription which was prescribed May consider 14 mg dose Patient education material given   . Preventive care needed: Reassessment of lipids, urine microalbumin which will be done today  Chemistry panel to be done today  Follow-up: 1 month    Patient Instructions  Check blood sugars on waking up 2-3 days a week  Also check blood sugars about 2 hours after meals and do this after different meals by rotation  Recommended blood sugar levels on waking up are 90-130 and about 2 hours after meal is 130-160  Please bring your blood sugar monitor to each visit, thank you  Rybelsus  3mg  daily till gone then 7mg  daily  Exercise daily    Total visit time including counseling =40 minutes  Elayne Snare 09/04/2019, 4:24 PM   Note: This office note was prepared with Dragon voice recognition system technology. Any transcriptional errors that result from this process are unintentional.

## 2019-09-04 NOTE — Patient Instructions (Addendum)
Check blood sugars on waking up 2-3 days a week  Also check blood sugars about 2 hours after meals and do this after different meals by rotation  Recommended blood sugar levels on waking up are 90-130 and about 2 hours after meal is 130-160  Please bring your blood sugar monitor to each visit, thank you  Rybelsus 3mg  daily till gone then 7mg  daily  Exercise daily

## 2019-09-04 NOTE — Addendum Note (Signed)
Addended by: Elayne Snare on: 09/04/2019 08:29 PM   Modules accepted: Orders

## 2019-10-03 ENCOUNTER — Other Ambulatory Visit (INDEPENDENT_AMBULATORY_CARE_PROVIDER_SITE_OTHER): Payer: Medicare Other

## 2019-10-03 ENCOUNTER — Other Ambulatory Visit: Payer: Self-pay

## 2019-10-03 DIAGNOSIS — E1165 Type 2 diabetes mellitus with hyperglycemia: Secondary | ICD-10-CM | POA: Diagnosis not present

## 2019-10-03 LAB — BASIC METABOLIC PANEL
BUN: 11 mg/dL (ref 6–23)
CO2: 25 mEq/L (ref 19–32)
Calcium: 9.3 mg/dL (ref 8.4–10.5)
Chloride: 101 mEq/L (ref 96–112)
Creatinine, Ser: 1.02 mg/dL (ref 0.40–1.50)
GFR: 87.88 mL/min (ref >60.00)
Glucose, Bld: 98 mg/dL (ref 70–99)
Potassium: 3.8 mEq/L (ref 3.5–5.1)
Sodium: 136 mEq/L (ref 135–145)

## 2019-10-04 LAB — FRUCTOSAMINE: Fructosamine: 239 umol/L (ref 0–285)

## 2019-10-10 ENCOUNTER — Other Ambulatory Visit: Payer: Self-pay

## 2019-10-10 ENCOUNTER — Ambulatory Visit (INDEPENDENT_AMBULATORY_CARE_PROVIDER_SITE_OTHER): Payer: Medicare Other | Admitting: Endocrinology

## 2019-10-10 ENCOUNTER — Encounter: Payer: Self-pay | Admitting: Endocrinology

## 2019-10-10 VITALS — BP 120/84 | HR 80 | Ht 67.0 in | Wt 158.0 lb

## 2019-10-10 DIAGNOSIS — I10 Essential (primary) hypertension: Secondary | ICD-10-CM

## 2019-10-10 DIAGNOSIS — Z23 Encounter for immunization: Secondary | ICD-10-CM | POA: Diagnosis not present

## 2019-10-10 DIAGNOSIS — E1165 Type 2 diabetes mellitus with hyperglycemia: Secondary | ICD-10-CM

## 2019-10-10 NOTE — Progress Notes (Signed)
Patient ID: [name], male   DOB: 1950/10/19, 69 y.o.   MRN: [id number]           Reason for Appointment: Follow-up for Type 2 Diabetes   History of Present Illness:          Date of diagnosis of type 2 diabetes mellitus:    1996     Background history:  Initially was on Metformin and possibly other medication but details are not available His A1c since 2016 has ranged between 6 and 8.1, mostly in the low 7 range He was started on Invokana in 2017 and also was given acarbose and Prandin in 2018 in addition He has also been prescribed bromocriptine to take at bedtime  Recent history:   Most recent A1c is 8.1 done on 07/02/2019  fructosamine is 239  Non-insulin hypoglycemic drugs currently: Rybelsus 7 mg daily, Jardiance 25 mg, Metformin 2 g  Current management, blood sugar patterns and problems identified:  His blood sugars appear to be excellent with increasing his Rybelsus  He was previously told to go up to 3 mg and then 7 mg which he takes in the morning regularly; has been on 7 mg for at least a month  He did have some abdominal distress and some nausea for 3 to 4 days but not recently  He has less appetite for dinner in the evening and mostly eating 2 meals a day  He thinks that because of increased satiety is losing some weight  Still continuing Metformin and Jardiance  He has had excellent blood sugars with highest about 160 after meals  However most of his postprandial readings have been done after breakfast and lunch  Occasionally may have higher readings after breakfast possibly when he is eating oatmeal        Side effects from medications have been: None  Typical meal intake: Breakfast is oatmeal or eggs and toast.  Lunch usually fruit.  Dinner is usually vegetables, usually avoiding fried food and carbohydrates like rice and pasta               Exercise:  Minimal  Glucose monitoring:  done 1 times a day         Glucometer:  Freestyle lite.        Blood Glucose readings by download of meter:   PRE-MEAL Fasting Lunch Dinner Bedtime Overall  Glucose range:  90-112  81, 87  119, 153    Mean/median:      128   POST-MEAL PC Breakfast PC Lunch PC Dinner  Glucose range:  160-184  151  143  Mean/median:       Dietician visit, most recent: None few years ago  Weight history: Usually has not had much fluctuation in his weight  Wt Readings from Last 3 Encounters:  10/10/19 158 lb (71.7 kg)  09/04/19 166 lb 6.4 oz (75.5 kg)  07/02/19 164 lb 12.8 oz (74.8 kg)    Glycemic control:   Lab Results  Component Value Date   HGBA1C 8.1 (A) 07/02/2019   HGBA1C 7.5 (A) 03/21/2019   HGBA1C 7.3 (A) 12/21/2018   Lab Results  Component Value Date   MICROALBUR <0.7 09/04/2019   LDLCALC 59 09/04/2019   CREATININE 1.02 10/03/2019   Lab Results  Component Value Date   MICRALBCREAT 0.8 09/04/2019    Lab Results  Component Value Date   FRUCTOSAMINE 239 10/03/2019    No visits with results within 1 Week(s) from this visit.  Latest known  visit with results is:  Lab on 10/03/2019  Component Date Value Ref Range Status  . Fructosamine 10/03/2019 239  0 - 285 umol/L Final   Comment: Published reference interval for apparently healthy subjects between age 79 and 42 is 70 - 285 umol/L and in a poorly controlled diabetic population is 228 - 563 umol/L with a mean of 396 umol/L.   [name] Sodium 10/03/2019 136  135 - 145 mEq/L Final  . Potassium 10/03/2019 3.8  3.5 - 5.1 mEq/L Final  . Chloride 10/03/2019 101  96 - 112 mEq/L Final  . CO2 10/03/2019 25  19 - 32 mEq/L Final  . Glucose, Bld 10/03/2019 98  70 - 99 mg/dL Final  . BUN 10/03/2019 11  6 - 23 mg/dL Final  . Creatinine, Ser 10/03/2019 1.02  0.40 - 1.50 mg/dL Final  . GFR 10/03/2019 87.88  >60.00 mL/min Final  . Calcium 10/03/2019 9.3  8.4 - 10.5 mg/dL Final    Allergies as of 10/10/2019      Reactions   Actos [pioglitazone] Swelling   edema   Trandolapril Swelling    w/  Angioedema   Azithromycin Rash   Other Rash   MAGIC MOUTHWASH: CAUSES RASH   Tetracycline Rash      Medication List       Accurate as of October 10, 2019  1:48 PM. If you have any questions, ask your nurse or doctor.        acyclovir 800 MG tablet Commonly known as: ZOVIRAX Take 800 mg by mouth daily as needed.   aspirin 81 MG tablet Take 81 mg by mouth daily.   atenolol 50 MG tablet Commonly known as: TENORMIN TAKE 1 TABLET DAILY   clotrimazole-betamethasone cream Commonly known as: LOTRISONE Apply 1 application topically at bedtime. To feet   freestyle lancets 1 each by Other route daily. Use to monitor glucose levels daily; E11.9   FREESTYLE LITE test strip Generic drug: glucose blood 1 each by Other route daily. Use to monitor glucose levels daily; E11.9   IRON PO Take 1 tablet by mouth daily. Prescribed bid, taking qd   Jardiance 25 MG Tabs tablet Generic drug: empagliflozin TAKE 1 TABLET DAILY   metFORMIN 500 MG 24 hr tablet Commonly known as: GLUCOPHAGE-XR TAKE 2 TABLETS TWICE A DAY   multivitamin tablet Take 1 tablet by mouth daily.   rosuvastatin 10 MG tablet Commonly known as: CRESTOR TAKE 1 TABLET DAILY   Rybelsus 7 MG Tabs Generic drug: Semaglutide Take 1 tablet by mouth daily before breakfast. Take 30 minutes before breakfast with water   sildenafil 100 MG tablet Commonly known as: VIAGRA Take 100 mg by mouth daily as needed. Use as directed       Allergies:  Allergies  Allergen Reactions  . Actos [Pioglitazone] Swelling    edema  . Trandolapril Swelling     w/ Angioedema  . Azithromycin Rash  . Other Rash    MAGIC MOUTHWASH: CAUSES RASH  . Tetracycline Rash    Past Medical History:  Diagnosis Date  . Arthritis    right shoulder  . BPH (benign prostatic hyperplasia)   . Hyperlipidemia   . Hypertension   . Impotence of organic origin   . Iron deficiency anemia   . Mild atherosclerosis of carotid artery, bilateral     last doppler 12-17-2010   bilateral ICA 1-39%  . Nocturia more than twice per night   . Testicular hypofunction   . Type 2 diabetes mellitus (  [facility])    endocrinologist-- dr Loanne Drilling  . Weak urinary stream   . Wears glasses     Past Surgical History:  Procedure Laterality Date  . CYSTOSCOPY WITH INSERTION OF UROLIFT N/A 12/12/2017   Procedure: CYSTOSCOPY WITH INSERTION OF UROLIFT;  Surgeon: Festus Aloe, MD;  Location: [facility];  Service: Urology;  Laterality: N/A;  . SHOULDER SURGERY Right 2004, 1990  . WISDOM TOOTH EXTRACTION      Family History  Problem Relation Age of Onset  . Heart disease Mother 71       Deceased  . Heart attack Mother   . Hypertension Mother   . Diabetes Father 26       Deceased  . Hypertension Father   . Heart disease Sister        heart trouble  . Heart attack Sister   . Diabetes Brother   . Heart attack Brother   . Cancer Paternal Aunt        x1-Bone  . Cancer Paternal Aunt        multiple  . Heart attack Maternal Grandmother   . Healthy Sister        x6  . Heart attack Sister   . Colon cancer Neg Hx     Social History:  reports that he has never smoked. He has never used smokeless tobacco. He reports previous alcohol use. He reports that he does not use drugs.   Review of Systems   Lipid history: Being treated with rosuvastatin 10 mg by PCP, labs as follows    Lab Results  Component Value Date   CHOL 118 09/04/2019   HDL 41.70 09/04/2019   LDLCALC 59 09/04/2019   LDLDIRECT 146.5 10/29/2012   TRIG 86.0 09/04/2019   CHOLHDL 3 09/04/2019           Hypertension: Has been treated with atenolol only, not checking at home  BP Readings from Last 3 Encounters:  10/10/19 120/84  09/04/19 122/78  07/02/19 112/70    Most recent eye exam was in 8/21, has had stable background retinopathy  Most recent foot exam: 9/21  Currently known complications of diabetes: Background retinopathy  LABS:  No visits with  results within 1 Week(s) from this visit.  Latest known visit with results is:  Lab on 10/03/2019  Component Date Value Ref Range Status  . Fructosamine 10/03/2019 239  0 - 285 umol/L Final   Comment: Published reference interval for apparently healthy subjects between age 37 and 42 is 74 - 285 umol/L and in a poorly controlled diabetic population is 228 - 563 umol/L with a mean of 396 umol/L.   [name] Sodium 10/03/2019 136  135 - 145 mEq/L Final  . Potassium 10/03/2019 3.8  3.5 - 5.1 mEq/L Final  . Chloride 10/03/2019 101  96 - 112 mEq/L Final  . CO2 10/03/2019 25  19 - 32 mEq/L Final  . Glucose, Bld 10/03/2019 98  70 - 99 mg/dL Final  . BUN 10/03/2019 11  6 - 23 mg/dL Final  . Creatinine, Ser 10/03/2019 1.02  0.40 - 1.50 mg/dL Final  . GFR 10/03/2019 87.88  >60.00 mL/min Final  . Calcium 10/03/2019 9.3  8.4 - 10.5 mg/dL Final    Physical Examination:  BP 120/84   Pulse 80   Ht 5\' 7"  (1.702 m)   Wt 158 lb (71.7 kg)   SpO2 98%   BMI 24.75 kg/m        ASSESSMENT:  Diabetes type  2 non-insulin-dependent  See history of present illness for detailed discussion of current diabetes management, blood sugar patterns and problems identified  Most recent A1c is 8.1 but now fructosamine is 239  Although he still has a mild increase in blood sugars after breakfast overall blood sugars are excellent with the combination of Rybelsus 7 mg, Metformin and Jardiance 25 mg  He has lost weight although he prefers not to, this is from decreased appetite on higher doses of Rybelsus which is otherwise being tolerated   HYPERTENSION: Currently only on beta-blocker and not taking an ACE inhibitor, will defer to PCP but need to continue monitor blood pressure   PLAN:    . Glucose monitoring: He will continue to periodically check readings after meals  . Diabetes education: Patient will benefit from meal planning education and this has been scheduled   Since he has a 7 mg Rybelsus  prescription he can try cutting this in half  If blood sugars are significantly and consistently higher after meals may need to go back up to 7 mg  A1c on the next visit  Patient Instructions  Check blood sugars on waking up 3 days a week  Also check blood sugars about 2 hours after meals and do this after different meals by rotation  Recommended blood sugar levels on waking up are 90-130 and about 2 hours after meal is 130-160  Please bring your blood sugar monitor to each visit, thank you  1/2 Rybelsus    Elayne Snare 10/10/2019, 1:48 PM   Note: This office note was prepared with Dragon voice recognition system technology. Any transcriptional errors that result from this process are unintentional.

## 2019-10-10 NOTE — Patient Instructions (Signed)
Check blood sugars on waking up 3 days a week  Also check blood sugars about 2 hours after meals and do this after different meals by rotation  Recommended blood sugar levels on waking up are 90-130 and about 2 hours after meal is 130-160  Please bring your blood sugar monitor to each visit, thank you  1/2 Rybelsus

## 2019-10-17 ENCOUNTER — Other Ambulatory Visit: Payer: Self-pay | Admitting: Endocrinology

## 2019-10-17 DIAGNOSIS — E1159 Type 2 diabetes mellitus with other circulatory complications: Secondary | ICD-10-CM

## 2019-10-23 DIAGNOSIS — I509 Heart failure, unspecified: Secondary | ICD-10-CM | POA: Diagnosis not present

## 2019-10-23 DIAGNOSIS — I119 Hypertensive heart disease without heart failure: Secondary | ICD-10-CM | POA: Diagnosis not present

## 2019-10-23 DIAGNOSIS — E663 Overweight: Secondary | ICD-10-CM | POA: Diagnosis not present

## 2019-10-23 DIAGNOSIS — Z79899 Other long term (current) drug therapy: Secondary | ICD-10-CM | POA: Diagnosis not present

## 2019-10-23 DIAGNOSIS — Z0000 Encounter for general adult medical examination without abnormal findings: Secondary | ICD-10-CM | POA: Diagnosis not present

## 2019-10-23 DIAGNOSIS — Z23 Encounter for immunization: Secondary | ICD-10-CM | POA: Diagnosis not present

## 2019-10-23 DIAGNOSIS — E785 Hyperlipidemia, unspecified: Secondary | ICD-10-CM | POA: Diagnosis not present

## 2019-10-23 DIAGNOSIS — Z008 Encounter for other general examination: Secondary | ICD-10-CM | POA: Diagnosis not present

## 2019-10-23 DIAGNOSIS — I6523 Occlusion and stenosis of bilateral carotid arteries: Secondary | ICD-10-CM | POA: Diagnosis not present

## 2019-10-23 DIAGNOSIS — R011 Cardiac murmur, unspecified: Secondary | ICD-10-CM | POA: Diagnosis not present

## 2019-10-23 DIAGNOSIS — E291 Testicular hypofunction: Secondary | ICD-10-CM | POA: Diagnosis not present

## 2019-10-23 DIAGNOSIS — E1151 Type 2 diabetes mellitus with diabetic peripheral angiopathy without gangrene: Secondary | ICD-10-CM | POA: Diagnosis not present

## 2019-10-23 DIAGNOSIS — L270 Generalized skin eruption due to drugs and medicaments taken internally: Secondary | ICD-10-CM | POA: Diagnosis not present

## 2019-10-24 ENCOUNTER — Encounter: Payer: Self-pay | Admitting: Dietician

## 2019-10-24 ENCOUNTER — Encounter: Payer: Medicare Other | Attending: Endocrinology | Admitting: Dietician

## 2019-10-24 ENCOUNTER — Other Ambulatory Visit: Payer: Self-pay

## 2019-10-24 DIAGNOSIS — E1159 Type 2 diabetes mellitus with other circulatory complications: Secondary | ICD-10-CM | POA: Diagnosis not present

## 2019-10-24 NOTE — Progress Notes (Signed)
Diabetes Self-Management Education  Visit Type: First/Initial  Appt. Start Time: 0930 Appt. End Time: 1100  10/24/2019  [name], identified by name and date of birth, is a 69 y.o. male with a diagnosis of Diabetes: Type 2.   ASSESSMENT Patient is here today alone.  He would like to learn how to control his blood sugar.  He prefers not to go onto insulin.  History includes Type 2 Diabetes (1996), HTN, HLD. Medications include:  Rybelsus (but concerns that he is allergic due to itching), Jardiance, Metformin A1C 7% 10/23/2019 decreased from 8.1% 07/02/2019  Weight hx: 160 lbs 10/23/19  170 lbs UBW (lost 10 lbs with Rybelsus)  Patient lives with his wife.  He does the shopping and his wife cooks at times.  He eats simply. He is retired from [employer].  Height 5\' 7"  (1.702 m), weight 160 lb (72.6 kg). Body mass index is 25.06 kg/m.   Diabetes Self-Management Education - 10/24/19 1126      Visit Information   Visit Type First/Initial      Initial Visit   Diabetes Type Type 2    Are you currently following a meal plan? No    Are you taking your medications as prescribed? Yes    Date Diagnosed 1996      Health Coping   How would you rate your overall health? Good      Psychosocial Assessment   Patient Belief/Attitude about Diabetes Motivated to manage diabetes    Self-care barriers None    Self-management support Doctor's office    Other persons present Patient    Patient Concerns Nutrition/Meal planning;Glycemic Control    Special Needs None    Preferred Learning Style No preference indicated    Learning Readiness Ready    How often do you need to have someone help you when you read instructions, pamphlets, or other written materials from your doctor or pharmacy? 1 - Never    What is the last grade level you completed in school? 4 years college      Pre-Education Assessment   Patient understands the diabetes disease and treatment process.  Needs Review    Patient understands incorporating nutritional management into lifestyle. Needs Review    Patient undertands incorporating physical activity into lifestyle. Needs Review    Patient understands using medications safely. Needs Review    Patient understands monitoring blood glucose, interpreting and using results Needs Review    Patient understands prevention, detection, and treatment of acute complications. Needs Review    Patient understands prevention, detection, and treatment of chronic complications. Needs Review    Patient understands how to develop strategies to address psychosocial issues. Needs Review    Patient understands how to develop strategies to promote health/change behavior. Needs Review      Complications   Last HgB A1C per patient/outside source 7 %   10/23/2019 decreased from 8.1% 07/02/2019   How often do you check your blood sugar? > 4 times/day   FreeStyle Libre   Fasting Blood glucose range (mg/dL) 70-129    Postprandial Blood glucose range (mg/dL) 130-179    Number of hypoglycemic episodes per month 0    Number of hyperglycemic episodes per week 0    Have you had a dilated eye exam in the past 12 months? Yes    Have you had a dental exam in the past 12 months? Yes    Are you checking your feet? No      Dietary Intake  Breakfast grilled chicken, egg, on English muffin, coffee with sugar free creamer, splenda OR plain plus light sugar instant oatmeal (4 of 7 days), peanut butter    Lunch sometimes skips as early dinner    Snack (afternoon) apple or banana    Dinner vegetable plate (cabbage, pinto beans, sweet potato, occasional grilled fish)   3-4 pm   Snack (evening) occasional fresh fruit    Beverage(s) water, coffee with sugar free creamer, splenda, sugar free flavored water      Exercise   Exercise Type Light (walking / raking leaves)   working around the house, has a stationary bike   How many days per week to you exercise? 3    How many  minutes per day do you exercise? 60    Total minutes per week of exercise 180      Patient Education   Previous Diabetes Education Yes (please comment)   had classes when diagnosed and saw an educator about 20 years ago   Disease state  Definition of diabetes, type 1 and 2, and the diagnosis of diabetes    Nutrition management  Role of diet in the treatment of diabetes and the relationship between the three main macronutrients and blood glucose level;Meal options for control of blood glucose level and chronic complications.    Physical activity and exercise  Role of exercise on diabetes management, blood pressure control and cardiac health.    Medications Reviewed patients medication for diabetes, action, purpose, timing of dose and side effects.    Monitoring Taught/discussed recording of test results and interpretation of SMBG.;Identified appropriate SMBG and/or A1C goals.;Daily foot exams;Yearly dilated eye exam    Acute complications Taught treatment of hypoglycemia - the 15 rule.;Discussed and identified patients' treatment of hyperglycemia.    Chronic complications Relationship between chronic complications and blood glucose control;Assessed and discussed foot care and prevention of foot problems      Individualized Goals (developed by patient)   Nutrition General guidelines for healthy choices and portions discussed    Physical Activity Exercise 5-7 days per week;30 minutes per day    Medications take my medication as prescribed    Monitoring  test my blood glucose as discussed      Post-Education Assessment   Patient understands the diabetes disease and treatment process. Demonstrates understanding / competency    Patient understands incorporating nutritional management into lifestyle. Demonstrates understanding / competency    Patient undertands incorporating physical activity into lifestyle. Demonstrates understanding / competency    Patient understands using medications safely.  Demonstrates understanding / competency    Patient understands monitoring blood glucose, interpreting and using results Demonstrates understanding / competency    Patient understands prevention, detection, and treatment of acute complications. Demonstrates understanding / competency    Patient understands prevention, detection, and treatment of chronic complications. Demonstrates understanding / competency    Patient understands how to develop strategies to address psychosocial issues. Demonstrates understanding / competency    Patient understands how to develop strategies to promote health/change behavior. Demonstrates understanding / competency      Outcomes   Expected Outcomes Demonstrated interest in learning. Expect positive outcomes    Future DMSE PRN    Program Status Completed           Individualized Plan for Diabetes Self-Management Training:   Learning Objective:  Patient will have a greater understanding of diabetes self-management. Patient education plan is to attend individual and/or group sessions per assessed needs and concerns.   Plan:  Patient Instructions  Aim for 30 minutes of exercise most days of the week.  Be careful about the number of grapes.  Consider berries instead most often.  Keep eating increased vegetables.     Expected Outcomes:  Demonstrated interest in learning. Expect positive outcomes  Education material provided: ADA - How to Thrive: A Guide for Your Journey with Diabetes, Snack sheet and Diabetes Resources  If problems or questions, patient to contact team via:  Phone  Future DSME appointment: PRN

## 2019-10-24 NOTE — Patient Instructions (Addendum)
Aim for 30 minutes of exercise most days of the week.  Be careful about the number of grapes.  Consider berries instead most often.  Keep eating increased vegetables.

## 2019-10-31 ENCOUNTER — Telehealth: Payer: Self-pay | Admitting: *Deleted

## 2019-10-31 NOTE — Telephone Encounter (Signed)
Confirm he is taking 7 mg Rybelsus.  He had been taking 7 mg Rybelsus also on his last visit so unlikely that this is causing the hives.  He needs to talk to the dermatologist

## 2019-10-31 NOTE — Telephone Encounter (Signed)
Patient said he had been taking 7 mg but as soon as he increased it from the 3.5 mg is when the hives started. He will make an appointment with his dermatologist to see if it could be something else.

## 2019-11-01 DIAGNOSIS — L509 Urticaria, unspecified: Secondary | ICD-10-CM | POA: Diagnosis not present

## 2019-11-13 DIAGNOSIS — L509 Urticaria, unspecified: Secondary | ICD-10-CM | POA: Diagnosis not present

## 2019-11-13 DIAGNOSIS — H1045 Other chronic allergic conjunctivitis: Secondary | ICD-10-CM | POA: Diagnosis not present

## 2019-11-13 DIAGNOSIS — L2089 Other atopic dermatitis: Secondary | ICD-10-CM | POA: Diagnosis not present

## 2019-11-13 DIAGNOSIS — J309 Allergic rhinitis, unspecified: Secondary | ICD-10-CM | POA: Diagnosis not present

## 2019-12-09 ENCOUNTER — Telehealth: Payer: Self-pay | Admitting: *Deleted

## 2019-12-09 ENCOUNTER — Other Ambulatory Visit: Payer: Self-pay | Admitting: *Deleted

## 2019-12-09 MED ORDER — METFORMIN HCL ER 500 MG PO TB24: 1000.0000 mg | ORAL_TABLET | Freq: Two times a day (BID) | ORAL | 3 refills | Status: DC

## 2019-12-09 NOTE — Telephone Encounter (Signed)
Okay to send 

## 2019-12-09 NOTE — Telephone Encounter (Signed)
Rx sent 

## 2019-12-09 NOTE — Telephone Encounter (Signed)
Patient called stating he is on vacation and left part of his medication at home. Patient wanted to know if a prescription can be sent in of metformin.    Patient states he is in [place] and the number to the pharmacy [phone]

## 2019-12-09 NOTE — Telephone Encounter (Signed)
That is okay.

## 2019-12-17 ENCOUNTER — Other Ambulatory Visit: Payer: Self-pay

## 2019-12-17 ENCOUNTER — Other Ambulatory Visit (INDEPENDENT_AMBULATORY_CARE_PROVIDER_SITE_OTHER): Payer: Medicare Other

## 2019-12-17 DIAGNOSIS — E1165 Type 2 diabetes mellitus with hyperglycemia: Secondary | ICD-10-CM

## 2019-12-17 LAB — BASIC METABOLIC PANEL
BUN: 14 mg/dL (ref 6–23)
CO2: 26 mEq/L (ref 19–32)
Calcium: 9.1 mg/dL (ref 8.4–10.5)
Chloride: 103 mEq/L (ref 96–112)
Creatinine, Ser: 0.94 mg/dL (ref 0.40–1.50)
GFR: 83.47 mL/min (ref >60.00)
Glucose, Bld: 103 mg/dL — ABNORMAL HIGH (ref 70–99)
Potassium: 3.9 mEq/L (ref 3.5–5.1)
Sodium: 139 mEq/L (ref 135–145)

## 2019-12-17 LAB — HEMOGLOBIN A1C: Hgb A1c MFr Bld: 7 % — ABNORMAL HIGH (ref 4.6–6.5)

## 2019-12-20 ENCOUNTER — Other Ambulatory Visit: Payer: Self-pay | Admitting: *Deleted

## 2019-12-20 ENCOUNTER — Ambulatory Visit (INDEPENDENT_AMBULATORY_CARE_PROVIDER_SITE_OTHER): Payer: Medicare Other | Admitting: Endocrinology

## 2019-12-20 ENCOUNTER — Encounter: Payer: Self-pay | Admitting: Endocrinology

## 2019-12-20 ENCOUNTER — Other Ambulatory Visit: Payer: Self-pay

## 2019-12-20 VITALS — BP 134/90 | HR 73 | Ht 67.0 in | Wt 153.6 lb

## 2019-12-20 DIAGNOSIS — R011 Cardiac murmur, unspecified: Secondary | ICD-10-CM | POA: Diagnosis not present

## 2019-12-20 DIAGNOSIS — I119 Hypertensive heart disease without heart failure: Secondary | ICD-10-CM | POA: Diagnosis not present

## 2019-12-20 DIAGNOSIS — I509 Heart failure, unspecified: Secondary | ICD-10-CM | POA: Diagnosis not present

## 2019-12-20 DIAGNOSIS — R079 Chest pain, unspecified: Secondary | ICD-10-CM | POA: Diagnosis not present

## 2019-12-20 DIAGNOSIS — E1165 Type 2 diabetes mellitus with hyperglycemia: Secondary | ICD-10-CM

## 2019-12-20 MED ORDER — RYBELSUS 3 MG PO TABS: ORAL_TABLET | ORAL | 1 refills | Status: DC

## 2019-12-20 NOTE — Patient Instructions (Signed)
Check blood sugars on waking up days a week  Also check blood sugars about 2 hours after meals and do this after different meals by rotation  Recommended blood sugar levels on waking up are 90-130 and about 2 hours after meal is 130-160  Please bring your blood sugar monitor to each visit, thank you   

## 2019-12-20 NOTE — Progress Notes (Signed)
Patient ID: [name], male   DOB: September 05, 1950, 69 y.o.   MRN: [id number]           Reason for Appointment: Follow-up for Type 2 Diabetes   History of Present Illness:          Date of diagnosis of type 2 diabetes mellitus:    1996     Background history:  Initially was on Metformin and possibly other medication but details are not available His A1c since 2016 has ranged between 6 and 8.1, mostly in the low 7 range He was started on Invokana in 2017 and also was given acarbose and Prandin in 2018 in addition He has also been prescribed bromocriptine to take at bedtime  Recent history:   Most recent A1c is improved at 7 compared to 8.1  Previous fructosamine is 239  Non-insulin hypoglycemic drugs currently: Rybelsus 7 mg, half tablet daily, Jardiance 25 mg, Metformin 2 g  Current management, blood sugar patterns and problems identified:  His blood sugars are being checked mostly after breakfast and not other meals  Because of tendency to some weight loss and decreased appetite he was told to take only half tablet of the Rybelsus 7 mg  Has been consistent with his Metformin and Jardiance  However appears to have lost up to 7 pounds recently  He does try to stay active  At breakfast time he is eating more oatmeal and occasionally banana also and not always getting protein  No nausea with Rybelsus        Side effects from medications have been: None  Typical meal intake: Breakfast is oatmeal or eggs and toast.  Lunch usually fruit.  Dinner is usually vegetables, usually avoiding fried food and carbohydrates like rice and pasta               Exercise:  yard work, bike  Glucose monitoring:  done 1 times a day         Glucometer:  [employer].       Blood Glucose readings by download of meter:  FASTING range 104-124 After meals only checked after breakfast with a range of 167-216 AVERAGE overall 142   PRE-MEAL Fasting Lunch Dinner Bedtime Overall  Glucose  range:  90-112  81, 87  119, 153    Mean/median:      128   POST-MEAL PC Breakfast PC Lunch PC Dinner  Glucose range:  160-184  151  143  Mean/median:       Dietician visit, most recent: None few years ago  Weight history: Usually has not had much fluctuation in his weight  Wt Readings from Last 3 Encounters:  12/20/19 153 lb 9.6 oz (69.7 kg)  10/24/19 160 lb (72.6 kg)  10/10/19 158 lb (71.7 kg)    Glycemic control:   Lab Results  Component Value Date   HGBA1C 7.0 (H) 12/17/2019   HGBA1C 8.1 (A) 07/02/2019   HGBA1C 7.5 (A) 03/21/2019   Lab Results  Component Value Date   MICROALBUR <0.7 09/04/2019   LDLCALC 59 09/04/2019   CREATININE 0.94 12/17/2019   Lab Results  Component Value Date   MICRALBCREAT 0.8 09/04/2019    Lab Results  Component Value Date   FRUCTOSAMINE 239 10/03/2019    Lab on 12/17/2019  Component Date Value Ref Range Status  . Sodium 12/17/2019 139  135 - 145 mEq/L Final  . Potassium 12/17/2019 3.9  3.5 - 5.1 mEq/L Final  . Chloride 12/17/2019 103  96 -  112 mEq/L Final  . CO2 12/17/2019 26  19 - 32 mEq/L Final  . Glucose, Bld 12/17/2019 103* 70 - 99 mg/dL Final  . BUN 12/17/2019 14  6 - 23 mg/dL Final  . Creatinine, Ser 12/17/2019 0.94  0.40 - 1.50 mg/dL Final  . GFR 12/17/2019 83.47  >60.00 mL/min Final   Calculated using the CKD-EPI Creatinine Equation (2021)  . Calcium 12/17/2019 9.1  8.4 - 10.5 mg/dL Final  . Hgb A1c MFr Bld 12/17/2019 7.0* 4.6 - 6.5 % Final   Glycemic Control Guidelines for People with Diabetes:Non Diabetic:  <6%Goal of Therapy: <7%Additional Action Suggested:  >8%     Allergies as of 12/20/2019      Reactions   Actos [pioglitazone] Swelling   edema   Trandolapril Swelling    w/ Angioedema   Azithromycin Rash   Other Rash   MAGIC MOUTHWASH: CAUSES RASH   Tetracycline Rash      Medication List       Accurate as of December 20, 2019 11:59 PM. If you have any questions, ask your nurse or doctor.         acyclovir 800 MG tablet Commonly known as: ZOVIRAX Take 800 mg by mouth daily as needed.   aspirin 81 MG tablet Take 81 mg by mouth daily.   atenolol 50 MG tablet Commonly known as: TENORMIN TAKE 1 TABLET DAILY   clotrimazole-betamethasone cream Commonly known as: LOTRISONE Apply 1 application topically at bedtime. To feet   freestyle lancets 1 each by Other route daily. Use to monitor glucose levels daily; E11.9   FREESTYLE LITE test strip Generic drug: glucose blood USE 1 STRIP DAILY TO MONITOR GLUCOSE LEVELS   IRON PO Take 1 tablet by mouth daily. Prescribed bid, taking qd   Jardiance 25 MG Tabs tablet Generic drug: empagliflozin TAKE 1 TABLET DAILY   metFORMIN 500 MG 24 hr tablet Commonly known as: GLUCOPHAGE-XR Take 2 tablets (1,000 mg total) by mouth 2 (two) times daily.   multivitamin tablet Take 1 tablet by mouth daily.   rosuvastatin 10 MG tablet Commonly known as: CRESTOR TAKE 1 TABLET DAILY   Rybelsus 7 MG Tabs Generic drug: Semaglutide Take 1 tablet by mouth daily before breakfast. Take 30 minutes before breakfast with water What changed: additional instructions   Rybelsus 3 MG Tabs Generic drug: Semaglutide Take 30 minutes before breakfast with water What changed:   how to take this  additional instructions Changed by: PEDRAZA, RHONDA, CMA   sildenafil 100 MG tablet Commonly known as: VIAGRA Take 100 mg by mouth daily as needed. Use as directed       Allergies:  Allergies  Allergen Reactions  . Actos [Pioglitazone] Swelling    edema  . Trandolapril Swelling     w/ Angioedema  . Azithromycin Rash  . Other Rash    MAGIC MOUTHWASH: CAUSES RASH  . Tetracycline Rash    Past Medical History:  Diagnosis Date  . Arthritis    right shoulder  . BPH (benign prostatic hyperplasia)   . Hyperlipidemia   . Hypertension   . Impotence of organic origin   . Iron deficiency anemia   . Mild atherosclerosis of carotid artery, bilateral     last doppler 12-17-2010   bilateral ICA 1-39%  . Nocturia more than twice per night   . Testicular hypofunction   . Type 2 diabetes mellitus [facility])    endocrinologist-- dr Loanne Drilling  . Weak urinary stream   . Wears glasses  Past Surgical History:  Procedure Laterality Date  . CYSTOSCOPY WITH INSERTION OF UROLIFT N/A 12/12/2017   Procedure: CYSTOSCOPY WITH INSERTION OF UROLIFT;  Surgeon: Festus Aloe, MD;  Location: [facility];  Service: Urology;  Laterality: N/A;  . SHOULDER SURGERY Right 2004, 1990  . WISDOM TOOTH EXTRACTION      Family History  Problem Relation Age of Onset  . Heart disease Mother 61       Deceased  . Heart attack Mother   . Hypertension Mother   . Diabetes Father 3       Deceased  . Hypertension Father   . Heart disease Sister        heart trouble  . Heart attack Sister   . Diabetes Brother   . Heart attack Brother   . Cancer Paternal Aunt        x1-Bone  . Cancer Paternal Aunt        multiple  . Heart attack Maternal Grandmother   . Healthy Sister        x6  . Heart attack Sister   . Colon cancer Neg Hx     Social History:  reports that he has never smoked. He has never used smokeless tobacco. He reports previous alcohol use. He reports that he does not use drugs.   Review of Systems   Lipid history: Being treated with rosuvastatin 10 mg by PCP, labs as follows    Lab Results  Component Value Date   CHOL 118 09/04/2019   HDL 41.70 09/04/2019   LDLCALC 59 09/04/2019   LDLDIRECT 146.5 10/29/2012   TRIG 86.0 09/04/2019   CHOLHDL 3 09/04/2019           Hypertension: Has been treated with atenolol only and followed by PCP  BP Readings from Last 3 Encounters:  12/20/19 134/90  10/10/19 120/84  09/04/19 122/78    Most recent eye exam was in 8/21, has had stable background retinopathy  Most recent foot exam: 9/21  Currently known complications of diabetes: Background retinopathy  LABS:  Lab on 12/17/2019   Component Date Value Ref Range Status  . Sodium 12/17/2019 139  135 - 145 mEq/L Final  . Potassium 12/17/2019 3.9  3.5 - 5.1 mEq/L Final  . Chloride 12/17/2019 103  96 - 112 mEq/L Final  . CO2 12/17/2019 26  19 - 32 mEq/L Final  . Glucose, Bld 12/17/2019 103* 70 - 99 mg/dL Final  . BUN 12/17/2019 14  6 - 23 mg/dL Final  . Creatinine, Ser 12/17/2019 0.94  0.40 - 1.50 mg/dL Final  . GFR 12/17/2019 83.47  >60.00 mL/min Final   Calculated using the CKD-EPI Creatinine Equation (2021)  . Calcium 12/17/2019 9.1  8.4 - 10.5 mg/dL Final  . Hgb A1c MFr Bld 12/17/2019 7.0* 4.6 - 6.5 % Final   Glycemic Control Guidelines for People with Diabetes:Non Diabetic:  <6%Goal of Therapy: <7%Additional Action Suggested:  >8%     Physical Examination:  BP 134/90   Pulse 73   Ht 5\' 7"  (1.702 m)   Wt 153 lb 9.6 oz (69.7 kg)   SpO2 98%   BMI 24.06 kg/m        ASSESSMENT:  Diabetes type 2 non-insulin-dependent  See history of present illness for detailed discussion of current diabetes management, blood sugar patterns and problems identified  Most recent A1c is 8.1 but now fructosamine is 239  He tends to have an increase in blood sugars after breakfast overall blood  sugars are excellent with the combination of Rybelsus 7 mg, Metformin and Jardiance 25 mg  He has lost weight again   HYPERTENSION: Currently only on beta-blocker and not taking an ACE inhibitor, blood pressure is relatively higher today on first measurement He will discuss with PCP as he may benefit from adding an ACE or an ARB drug  PLAN:      Needs to add a protein to breakfast daily  Switch to 3 mg Rybelsus since it is not desirable to cut the tablets in half from manufacturer recommendation  Also will reduce his Jardiance to half a tablet of the 25 mg to avoid any further weight loss  On next prescription he can switch to 10 mg dose  Make sure he checks his blood sugars after lunch or dinner by rotation  also  Patient Instructions  Check blood sugars on waking up days a week  Also check blood sugars about 2 hours after meals and do this after different meals by rotation  Recommended blood sugar levels on waking up are 90-130 and about 2 hours after meal is 130-160  Please bring your blood sugar monitor to each visit, thank you      Elayne Snare 12/22/2019, 9:53 PM   Note: This office note was prepared with Dragon voice recognition system technology. Any transcriptional errors that result from this process are unintentional.

## 2019-12-23 ENCOUNTER — Other Ambulatory Visit: Payer: Self-pay | Admitting: Endocrinology

## 2019-12-25 ENCOUNTER — Telehealth: Payer: Self-pay

## 2019-12-25 DIAGNOSIS — L509 Urticaria, unspecified: Secondary | ICD-10-CM | POA: Diagnosis not present

## 2019-12-25 NOTE — Telephone Encounter (Signed)
NOTES ON FILE FROM [facility] [phone], SENT REFERRAL TO SCHEDULING 

## 2020-01-16 ENCOUNTER — Ambulatory Visit (INDEPENDENT_AMBULATORY_CARE_PROVIDER_SITE_OTHER): Payer: Medicare Other

## 2020-01-16 ENCOUNTER — Encounter: Payer: Self-pay | Admitting: Radiology

## 2020-01-16 ENCOUNTER — Other Ambulatory Visit: Payer: Self-pay

## 2020-01-16 ENCOUNTER — Encounter: Payer: Self-pay | Admitting: Internal Medicine

## 2020-01-16 ENCOUNTER — Ambulatory Visit (INDEPENDENT_AMBULATORY_CARE_PROVIDER_SITE_OTHER): Payer: Medicare Other | Admitting: Internal Medicine

## 2020-01-16 VITALS — BP 126/88 | HR 69 | Ht 66.5 in | Wt 157.6 lb

## 2020-01-16 DIAGNOSIS — E119 Type 2 diabetes mellitus without complications: Secondary | ICD-10-CM | POA: Insufficient documentation

## 2020-01-16 DIAGNOSIS — R002 Palpitations: Secondary | ICD-10-CM | POA: Diagnosis not present

## 2020-01-16 DIAGNOSIS — R011 Cardiac murmur, unspecified: Secondary | ICD-10-CM

## 2020-01-16 DIAGNOSIS — R079 Chest pain, unspecified: Secondary | ICD-10-CM | POA: Diagnosis not present

## 2020-01-16 DIAGNOSIS — E782 Mixed hyperlipidemia: Secondary | ICD-10-CM

## 2020-01-16 DIAGNOSIS — I10 Essential (primary) hypertension: Secondary | ICD-10-CM

## 2020-01-16 DIAGNOSIS — E1159 Type 2 diabetes mellitus with other circulatory complications: Secondary | ICD-10-CM | POA: Insufficient documentation

## 2020-01-16 NOTE — Progress Notes (Signed)
Enrolled patient for a 14 day Zio XT Monitor to be mailed to patients home  

## 2020-01-16 NOTE — Patient Instructions (Addendum)
Medication Instructions:  *If you need a refill on your cardiac medications before your next appointment, please call your pharmacy*  Testing/Procedures: Your physician has requested that you have an echocardiogram. Echocardiography is a painless test that uses sound waves to create images of your heart. It provides your doctor with information about the size and shape of your heart and how well your heart's chambers and valves are working. This procedure takes approximately one hour. There are no restrictions for this procedure.  Your physician has recommended that you wear a ZIO Cardiac monitor. Cardiac monitors are medical devices that record the heart's electrical activity. Doctors most often use these monitors to diagnose arrhythmias. Arrhythmias are problems with the speed or rhythm of the heartbeat. The monitor is a small, portable device. You can wear one while you do your normal daily activities. This is usually used to diagnose what is causing palpitations/syncope (passing out).  Follow-Up: At [facility], you and your health needs are our priority.  As part of our continuing mission to provide you with exceptional heart care, we have created designated Provider Care Teams.  These Care Teams include your primary Cardiologist (physician) and Advanced Practice Providers (APPs -  Physician Assistants and Nurse Practitioners) who all work together to provide you with the care you need, when you need it.  We recommend signing up for the patient portal called "MyChart".  Sign up information is provided on this After Visit Summary.  MyChart is used to connect with patients for Virtual Visits (Telemedicine).  Patients are able to view lab/test results, encounter notes, upcoming appointments, etc.  Non-urgent messages can be sent to your provider as well.   To learn more about what you can do with MyChart, go to [email or web address].    Your next appointment:   Your physician recommends that  you schedule a follow-up appointment in: 2 - 3 MONTHS with Dr. Gasper Sells  The format for your next appointment:   In Person with Rudean Haskell, MD  [place] term monitor-Live Telemetry  - Your physician has requested you wear a ZIO patch monitor for 14  days.   - This is a single patch monitor. [employer] supplies one patch monitor per enrollment. Additional stickers are not available.   - Please do not apply patch if you will be having a Nuclear Stress Test, Echocardiogram, Cardiac CT, MRI, or Chest Xray during the time frame you would be wearing the monitor. The patch cannot be worn during these tests. You cannot remove and re-apply the ZIO AT patch monitor.   - Your ZIO patch monitor will be sent Fed Ex from [employer] directly to your home address. The monitor may also be mailed to a PO BOX if home delivery is not available. It may take 3-5 days to receive your monitor after you have been enrolled.   - Once you have received you monitor, please review enclosed instructions. Your monitor has already been registered assigning a specific monitor serial # to you.   Applying the monitor  1. Shave hair from upper left chest.  2. Hold abrader disc by orange tab. Rub abrader in 40 strokes over left upper chest as indicated in your monitor instructions.  3. Clean area with 4 enclosed alcohol pads. Use all pads to ensure the area is cleaned thoroughly. Let dry.  4. Apply patch as indicated in monitor instructions. Patch will be placed under collarbone on left side of chest with arrow pointing upward. 5. Rub patch adhesive wings  for 2 minutes. Remove the white label marked "1". Remove the white label marked "2". Rub patch adhesive wings for 2 additional minutes. 6. While looking in a mirror, press and release button in center of patch. A small green light will flash 3-4 times. This will be your only indicator the monitor has been turned on.  7. Do not shower for the first 24  hours. You may shower after the first 24 hours.  8. Press the button if you feel a symptom. You will hear a small click. Record Date, Time and Symptom in the Patient Log.   Starting the Gateway  In your kit there is a Hydrographic surveyor box the size of a cellphone. This is Airline pilot. It transmits all your recorded data to [facility]. This box must stay within 10 feet of you at all times. Open the box and push the * button. There will be a light that blinks orange and then green a few times. When the light stops blinking, the Gateway is connected to the ZIO patch.  Call [employer] at [phone] to confirm your monitor is transmitting.   Returning your monitor  Remove your patch and place it inside the [place]. In the lower half of the Gateway there is a white bag with prepaid postage on it. Place Gateway in bag and seal. Mail package back to [place] as soon as possible. Your physician should have your final report approximately 7 days after you have mailed back your monitor.   - Call [place] at [phone] if you have questions regarding your ZIO AT patch monitor. Call them immediately if you see an orange light blinking on your monitor.   - If your monitor falls off in less than 4 days contact our Monitor department at [phone]. If your monitor becomes loose or falls off after 4 days call [employer] at [phone] for suggestions on securing your monitor.

## 2020-01-16 NOTE — Progress Notes (Signed)
Cardiology Office Note:    Date:  01/16/2020   ID:  Henderson [place], DOB 1950-04-18, MRN [id number]  PCP:  Shelda Pal, DO  [place] Cardiologist:  No primary care provider on file.  [facility] Electrophysiologist:  None   CC: Chest Pains Consulted for the evaluation of chest pain and new heart murmur at the behest of [name], [name], DO  History of Present Illness:    [name] is a 70 y.o. male with a hx of DM with HTN, HLD who presents for evaluation.  Patient notes that he is feeling chest pains a few weeks back. Feels his heart beating out of his chest with sharp pains.  This occurs in the mornings and at night.  No radiation.   Discomfort occurs with no triggers both in the morning and at night, worsens with no triggers, and improves with spontaneously.  Had this issue in the 80s but with normal stress test.  Patient exertion notable for going up and down stairs all day with doing repairs in his house and feels no symptoms.  No shortness of breath, DOE.  No PND or orthopnea.  No bendopnea, weight gain, notes ankle swelling at the end of the day, but no abdominal swelling.  No syncope or near syncope.  Notes  palpitations and  funny heart beats where his heart is beating out of his chest; in the last two weeks this has happened twice.     Patient reports prior cardiac testing including 1980s stress test.  No history of prematurity.  No Fen-Phen.  Ambulatory BP not done.  Past Medical History:  Diagnosis Date  . Arthritis    right shoulder  . BPH (benign prostatic hyperplasia)   . Hyperlipidemia   . Hypertension   . Impotence of organic origin   . Iron deficiency anemia   . Mild atherosclerosis of carotid artery, bilateral    last doppler 12-17-2010   bilateral ICA 1-39%  . Nocturia more than twice per night   . Testicular hypofunction   . Type 2 diabetes mellitus [facility])    endocrinologist-- dr Loanne Drilling  . Weak urinary stream   . Wears  glasses     Past Surgical History:  Procedure Laterality Date  . CYSTOSCOPY WITH INSERTION OF UROLIFT N/A 12/12/2017   Procedure: CYSTOSCOPY WITH INSERTION OF UROLIFT;  Surgeon: Festus Aloe, MD;  Location: [facility];  Service: Urology;  Laterality: N/A;  . SHOULDER SURGERY Right 2004, 1990  . WISDOM TOOTH EXTRACTION      Current Medications: Current Meds  Medication Sig  . acyclovir (ZOVIRAX) 800 MG tablet Take 800 mg by mouth daily as needed.  [name] aspirin 81 MG tablet Take 81 mg by mouth daily.  [name] atenolol (TENORMIN) 50 MG tablet TAKE 1 TABLET DAILY  . clotrimazole-betamethasone (LOTRISONE) cream Apply 1 application topically at bedtime. To feet  . FREESTYLE LITE test strip USE 1 STRIP DAILY TO MONITOR GLUCOSE LEVELS  . IRON PO Take 1 tablet by mouth daily. Prescribed bid, taking qd  . JARDIANCE 25 MG TABS tablet TAKE 1 TABLET DAILY  . Lancets (FREESTYLE) lancets 1 each by Other route daily. Use to monitor glucose levels daily; E11.9  . metFORMIN (GLUCOPHAGE-XR) 500 MG 24 hr tablet Take 2 tablets (1,000 mg total) by mouth 2 (two) times daily.  . Multiple Vitamin (MULTIVITAMIN) tablet Take 1 tablet by mouth daily.  . rosuvastatin (CRESTOR) 10 MG tablet TAKE 1 TABLET DAILY  . Semaglutide (RYBELSUS) 3  MG TABS Take 30 minutes before breakfast with water  . sildenafil (VIAGRA) 100 MG tablet Take 100 mg by mouth daily as needed. Use as directed  . testosterone cypionate (DEPOTESTOSTERONE CYPIONATE) 200 MG/ML injection Inject 200 mg into the muscle every 14 (fourteen) days.  [name] triamcinolone (KENALOG) 0.1 % Apply topically.     Allergies:   Actos [pioglitazone], Trandolapril, Azithromycin, Other, and Tetracycline   Social History   Socioeconomic History  . Marital status: Married    Spouse name: [name]  . Number of children: 0  . Years of education: Not on file  . Highest education level: Not on file  Occupational History    Employer: [employer]   Tobacco Use  . Smoking status: Never Smoker  . Smokeless tobacco: Never Used  Vaping Use  . Vaping Use: Never used  Substance and Sexual Activity  . Alcohol use: Not Currently    Alcohol/week: 0.0 standard drinks  . Drug use: No  . Sexual activity: Yes  Other Topics Concern  . Not on file  Social History Narrative   Regular exercise-yes   Caffeine-2 cups   Married=wife [name] 22 years   Cigar Smoker-quit smoking Jan 2010   Social Determinants of Radio broadcast assistant Strain: Not on file  Food Insecurity: Not on file  Transportation Needs: Not on file  Physical Activity: Not on file  Stress: Not on file  Social Connections: Not on file     Family History: The patient's family history includes Cancer in his paternal aunt and paternal aunt; Diabetes in his brother; Diabetes (age of onset: 69) in his father; Healthy in his sister; Heart attack in his brother, maternal grandmother, mother, sister, and sister; Heart disease in his sister; Heart disease (age of onset: 14) in his mother; Hypertension in his father and mother. There is no history of Colon cancer. History of coronary artery disease notable for brother, mother, grandmother, and sisters. History of heart failure notable for no members. History of arrhythmia notable for no members.  ROS:   Please see the history of present illness.    All other systems reviewed and are negative.  EKGs/Labs/Other Studies Reviewed:    The following studies were reviewed today:  EKG:   01/16/20: SR 1st HB rate 69 with inferolateral TWI and LVH (secondary repolarization   12/12/2017: Sinus bradycardia 1st heart block rate 46, LVH  Recent Labs: 09/04/2019: ALT 38 12/17/2019: BUN 14; Creatinine, Ser 0.94; Potassium 3.9; Sodium 139  Recent Lipid Panel    Component Value Date/Time   CHOL 118 09/04/2019 1032   TRIG 86.0 09/04/2019 1032   HDL 41.70 09/04/2019 1032   CHOLHDL 3 09/04/2019 1032   VLDL 17.2 09/04/2019 1032   LDLCALC  59 09/04/2019 1032   LDLDIRECT 146.5 10/29/2012 [facility]  or Outside Clinic Studies (OSH):  Date: 09/04/2019 Cholesterol 118 HDL 41 LDL 59 Tgs 86 Creatinine 0.94 TSH 1.05 (07/13/2017)  Risk Assessment/Calculations:     N/A  Physical Exam:    VS:  BP 126/88   Pulse 69   Ht 5' 6.5" (1.689 m)   Wt 157 lb 9.6 oz (71.5 kg)   SpO2 98%   BMI 25.06 kg/m     Wt Readings from Last 3 Encounters:  01/16/20 157 lb 9.6 oz (71.5 kg)  12/20/19 153 lb 9.6 oz (69.7 kg)  10/24/19 160 lb (72.6 kg)     GEN:  Well nourished, well developed in no acute distress HEENT:  Normal NECK: No JVD; No carotid bruits LYMPHATICS: No lymphadenopathy CARDIAC: RRR, II/VI systolic crescendo no rubs, gallops RESPIRATORY:  Clear to auscultation without rales, wheezing or rhonchi  ABDOMEN: Soft, non-tender, non-distended MUSCULOSKELETAL:  No edema; No deformity  SKIN: Warm and dry NEUROLOGIC:  Alert and oriented x 3 PSYCHIATRIC:  Normal affect   ASSESSMENT:    1. Palpitations   2. Chest pain of uncertain etiology   3. Heart murmur   4. Diabetes mellitus with coincident hypertension ([place])   5. Mixed hyperlipidemia    PLAN:    In order of problems listed above:  Chest Pain associated with palpitations New Heart Murmur - The patient presents with non-cardiac CP, though with risk factors - EKG shows baseline ST changes associated with LVH. - continue ASA 81 mg QD, statin, and beta blocker therapies   - will get echocardiogram  - will get 14 day non-live ZioPatch - if all is negative, or escalation in sx, will pursue ischemic work up (CCTA with PRN FFR)  Diabetes with Hypertension - ambulatory blood pressure not done, will start ambulatory BP monitoring; gave education on how to perform ambulatory blood pressure monitoring including the frequency and technique; goal ambulatory blood pressure < 135/85 on average - continue home medications  - discussed diet (DASH/low sodium), and  exercise/weight loss interventions   Hyperlipidemia (mixed) -LDL goal less than 100 -continue current statin - gave education on dietary changes  2-3 months follow up unless new symptoms or abnormal test results warranting change in plan  Would be reasonable for  APP Follow up   Medication Adjustments/Labs and Tests Ordered: Current medicines are reviewed at length with the patient today.  Concerns regarding medicines are outlined above.  No orders of the defined types were placed in this encounter.  No orders of the defined types were placed in this encounter.   There are no Patient Instructions on file for this visit.   Signed, Werner Lean, MD  01/16/2020 8:33 AM    [place]

## 2020-01-22 ENCOUNTER — Ambulatory Visit (HOSPITAL_COMMUNITY): Payer: Medicare Other | Attending: Cardiovascular Disease

## 2020-01-22 ENCOUNTER — Other Ambulatory Visit: Payer: Self-pay

## 2020-01-22 DIAGNOSIS — R002 Palpitations: Secondary | ICD-10-CM

## 2020-01-22 DIAGNOSIS — R011 Cardiac murmur, unspecified: Secondary | ICD-10-CM | POA: Diagnosis present

## 2020-01-22 DIAGNOSIS — R079 Chest pain, unspecified: Secondary | ICD-10-CM

## 2020-01-22 LAB — ECHOCARDIOGRAM COMPLETE
Area-P 1/2: 4.87 cm2
S' Lateral: 2 cm

## 2020-02-11 LAB — HM DIABETES EYE EXAM

## 2020-02-12 LAB — HM DIABETES EYE EXAM

## 2020-02-17 ENCOUNTER — Encounter: Payer: Self-pay | Admitting: Endocrinology

## 2020-02-26 ENCOUNTER — Emergency Department (HOSPITAL_COMMUNITY): Payer: Medicare Other

## 2020-02-26 ENCOUNTER — Other Ambulatory Visit: Payer: Self-pay

## 2020-02-26 ENCOUNTER — Encounter (HOSPITAL_COMMUNITY): Payer: Self-pay | Admitting: Emergency Medicine

## 2020-02-26 ENCOUNTER — Emergency Department (HOSPITAL_COMMUNITY)
Admission: EM | Admit: 2020-02-26 | Discharge: 2020-02-26 | Disposition: A | Discharge status: Home / Self Care | Payer: Medicare Other | Attending: Emergency Medicine | Admitting: Emergency Medicine

## 2020-02-26 DIAGNOSIS — R42 Dizziness and giddiness: Secondary | ICD-10-CM | POA: Insufficient documentation

## 2020-02-26 DIAGNOSIS — Z79899 Other long term (current) drug therapy: Secondary | ICD-10-CM | POA: Insufficient documentation

## 2020-02-26 DIAGNOSIS — Z7982 Long term (current) use of aspirin: Secondary | ICD-10-CM | POA: Diagnosis not present

## 2020-02-26 DIAGNOSIS — Z7984 Long term (current) use of oral hypoglycemic drugs: Secondary | ICD-10-CM | POA: Diagnosis not present

## 2020-02-26 DIAGNOSIS — E11319 Type 2 diabetes mellitus with unspecified diabetic retinopathy without macular edema: Secondary | ICD-10-CM | POA: Diagnosis not present

## 2020-02-26 DIAGNOSIS — I10 Essential (primary) hypertension: Secondary | ICD-10-CM | POA: Diagnosis not present

## 2020-02-26 LAB — BASIC METABOLIC PANEL
Anion gap: 12 (ref 5–15)
BUN: 15 mg/dL (ref 8–23)
CO2: 25 mmol/L (ref 22–32)
Calcium: 9.3 mg/dL (ref 8.9–10.3)
Chloride: 101 mmol/L (ref 98–111)
Creatinine, Ser: 0.91 mg/dL (ref 0.61–1.24)
GFR, Estimated: >60 mL/min (ref >60)
Glucose, Bld: 143 mg/dL — ABNORMAL HIGH (ref 70–99)
Potassium: 3.6 mmol/L (ref 3.5–5.1)
Sodium: 138 mmol/L (ref 135–145)

## 2020-02-26 LAB — CBC WITH DIFFERENTIAL/PLATELET
Abs Immature Granulocytes: 0.02 10*3/uL (ref 0.00–0.07)
Basophils Absolute: 0 10*3/uL (ref 0.0–0.1)
Basophils Relative: 1 %
Eosinophils Absolute: 0.2 10*3/uL (ref 0.0–0.5)
Eosinophils Relative: 3 %
HCT: 47.4 % (ref 39.0–52.0)
Hemoglobin: 15.1 g/dL (ref 13.0–17.0)
Immature Granulocytes: 0 %
Lymphocytes Relative: 41 %
Lymphs Abs: 2.7 10*3/uL (ref 0.7–4.0)
MCH: 25.5 pg — ABNORMAL LOW (ref 26.0–34.0)
MCHC: 31.9 g/dL (ref 30.0–36.0)
MCV: 79.9 fL — ABNORMAL LOW (ref 80.0–100.0)
Monocytes Absolute: 0.4 10*3/uL (ref 0.1–1.0)
Monocytes Relative: 7 %
Neutro Abs: 3.1 10*3/uL (ref 1.7–7.7)
Neutrophils Relative %: 48 %
Platelets: 246 10*3/uL (ref 150–400)
RBC: 5.93 MIL/uL — ABNORMAL HIGH (ref 4.22–5.81)
RDW: 17.2 % — ABNORMAL HIGH (ref 11.5–15.5)
WBC: 6.4 10*3/uL (ref 4.0–10.5)
nRBC: 0 % (ref 0.0–0.2)

## 2020-02-26 MED ORDER — MECLIZINE HCL 12.5 MG PO TABS: 12.5000 mg | ORAL_TABLET | Freq: Two times a day (BID) | ORAL | 0 refills | Status: AC | PRN

## 2020-02-26 MED ORDER — IOHEXOL 350 MG/ML SOLN
80.0000 mL | Freq: Once | INTRAVENOUS | Status: AC | PRN
  Administered 2020-02-26: 80 mL via INTRAVENOUS

## 2020-02-26 MED ORDER — MECLIZINE HCL 25 MG PO TABS
25.0000 mg | ORAL_TABLET | Freq: Once | ORAL | Status: AC
  Administered 2020-02-26: 25 mg via ORAL
  Filled 2020-02-26: qty 1

## 2020-02-26 NOTE — ED Notes (Signed)
PT wife, [name] updated.

## 2020-02-26 NOTE — ED Notes (Signed)
Pt returns from mri placed back on tele.

## 2020-02-26 NOTE — ED Notes (Signed)
Pt transported to MRI 

## 2020-02-26 NOTE — ED Provider Notes (Incomplete)
I provided a substantive portion of the care of this patient.  I personally performed the entirety of the {Shared Choices:25152} for this encounter. {Remember to document shared critical care using "edcritical" dot phrase:1} EKG Interpretation  Date/Time:  Wednesday February 26 2020 07:41:10 EST Ventricular Rate:  59 PR Interval:    QRS Duration: 89 QT Interval:  391 QTC Calculation: 388 R Axis:   12 Text Interpretation: Sinus rhythm Prolonged PR interval Borderline repolarization abnormality no change from previous tracings Confirmed by Charlesetta Shanks [phone]) on 02/26/2020 11:30:46 AM

## 2020-02-26 NOTE — ED Provider Notes (Signed)
[facility] EMERGENCY DEPARTMENT Provider Note   CSN: [id number] Arrival date & time: 02/26/20  2725     History Chief Complaint  Patient presents with  . Dizzy    [name] is a 70 y.o. male.  HPI Patient is a 70 year old male with a history of hyperlipidemia, hypertension, type 2 diabetes, who presents the emergency department due to dizziness.  Patient states he initially had an episode a few weeks ago.  He states he was evaluated by his PCP after this occurred and his symptoms resolved.  He states for the past few weeks he has had no vertiginous symptoms until last night.  He states that he could not sleep and woke up and noticed that he was dizzy.  Felt that it worsened with movement as well as when lying flat.  Denies any alcohol use or drug use.  No numbness, tingling, weakness, chest pain, shortness of breath.    Past Medical History:  Diagnosis Date  . Arthritis    right shoulder  . BPH (benign prostatic hyperplasia)   . Hyperlipidemia   . Hypertension   . Impotence of organic origin   . Iron deficiency anemia   . Mild atherosclerosis of carotid artery, bilateral    last doppler 12-17-2010   bilateral ICA 1-39%  . Nocturia more than twice per night   . Testicular hypofunction   . Type 2 diabetes mellitus [facility])    endocrinologist-- dr Loanne Drilling  . Weak urinary stream   . Wears glasses     Patient Active Problem List   Diagnosis Date Noted  . Palpitations 01/16/2020  . Chest pain of uncertain etiology 36/64/4034  . Heart murmur 01/16/2020  . Diabetes mellitus with coincident hypertension ([place]) 01/16/2020  . Diabetic retinopathy ([place]) 11/02/2015  . Screening for ischemic heart disease 10/26/2015  . Immunity status testing 10/26/2015  . Acute prostatitis 01/06/2015  . Edema 08/22/2014  . Angioedema of lips 07/14/2014  . Screening, ischemic heart disease 03/17/2014  . Visit for preventive health examination 03/17/2014  . Swelling of upper  lip 10/20/2013  . Hyperlipidemia with target LDL less than 100 04/14/2013  . Hypertension 04/14/2013  . Need for prophylactic vaccination with tetanus-diphtheria (Td) 04/14/2013  . Weakness 05/03/2010  . Special screening for malignant neoplasm of prostate 05/03/2010  . 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 TESTICULAR HYPOFUNCTION 09/22/2008  . DIZZINESS 08/14/2008  . KNEE PAIN 05/29/2008  . GLOSSITIS 06/27/2007  . Diabetes ([place]) 03/27/2007  . CEREBROVASCULAR DISEASE 03/27/2007  . FIBROMYALGIA 11/09/2006    Past Surgical History:  Procedure Laterality Date  . CYSTOSCOPY WITH INSERTION OF UROLIFT N/A 12/12/2017   Procedure: CYSTOSCOPY WITH INSERTION OF UROLIFT;  Surgeon: Festus Aloe, MD;  Location[facility];  Service: Urology;  Laterality: N/A;  . SHOULDER SURGERY Right 2004, 1990  . WISDOM TOOTH EXTRACTION         Family History  Problem Relation Age of Onset  . Heart disease Mother 51       Deceased  . Heart attack Mother   . Hypertension Mother   . Diabetes Father 11       Deceased  . Hypertension Father   . Heart disease Sister        heart trouble  . Heart attack Sister   . Diabetes Brother   . Heart attack Brother   . Cancer Paternal Aunt        x1-Bone  . Cancer Paternal Aunt  multiple  . Heart attack Maternal Grandmother   . Healthy Sister        x6  . Heart attack Sister   . Colon cancer Neg Hx     Social History   Tobacco Use  . Smoking status: Never Smoker  . Smokeless tobacco: Never Used  Vaping Use  . Vaping Use: Never used  Substance Use Topics  . Alcohol use: Not Currently    Alcohol/week: 0.0 standard drinks  . Drug use: No    Home Medications Prior to Admission medications   Medication Sig Start Date End Date Taking? Authorizing Provider  acyclovir (ZOVIRAX) 800 MG tablet Take 800 mg by mouth daily as needed (for flare-up). 02/15/13  Yes [provider]  aspirin 81 MG tablet Take 81 mg by  mouth daily.   Yes [provider]  atenolol (TENORMIN) 50 MG tablet TAKE 1 TABLET DAILY Patient taking differently: Take 50 mg by mouth daily. 05/28/19  Yes Wendling, [name], DO  clotrimazole-betamethasone (LOTRISONE) cream Apply 1 application topically at bedtime. To feet Patient taking differently: Apply 1 application topically daily as needed (to feet). 05/14/19  Yes Wendling, [name], DO  JARDIANCE 25 MG TABS tablet TAKE 1 TABLET DAILY Patient taking differently: Take 25 mg by mouth daily. 12/23/19  Yes Elayne Snare, MD  meclizine (ANTIVERT) 12.5 MG tablet Take 1 tablet (12.5 mg total) by mouth 2 (two) times daily as needed for dizziness. 02/26/20  Yes Rayna Sexton, PA-C  metFORMIN (GLUCOPHAGE-XR) 500 MG 24 hr tablet Take 2 tablets (1,000 mg total) by mouth 2 (two) times daily. 12/09/19  Yes Elayne Snare, MD  Multiple Vitamin (MULTIVITAMIN) tablet Take 1 tablet by mouth daily.   Yes [provider]  olopatadine (PATANOL) 0.1 % ophthalmic solution Apply 1 drop to eye daily as needed (for itchy and dry eyes).   Yes [provider]  rosuvastatin (CRESTOR) 10 MG tablet TAKE 1 TABLET DAILY Patient taking differently: Take 10 mg by mouth daily. 07/23/19  Yes Wendling, [name], DO  Semaglutide (RYBELSUS) 3 MG TABS Take 30 minutes before breakfast with water Patient taking differently: Take 3 mg by mouth daily. Take 30 minutes before breakfast with water 12/20/19  Yes Elayne Snare, MD  sildenafil (VIAGRA) 100 MG tablet Take 100 mg by mouth daily as needed for erectile dysfunction. Use as directed   Yes [provider]  testosterone cypionate (DEPOTESTOSTERONE CYPIONATE) 200 MG/ML injection Inject 200 mg into the muscle every 14 (fourteen) days. 11/19/19  Yes [provider]  triamcinolone (KENALOG) 0.1 % Apply 1 application topically at bedtime. 12/06/19  Yes [provider]  FREESTYLE LITE test strip USE 1 STRIP DAILY TO MONITOR  GLUCOSE LEVELS 10/17/19   Renato Shin, MD  Lancets (FREESTYLE) lancets 1 each by Other route daily. Use to monitor glucose levels daily; E11.9 09/19/18   Renato Shin, MD    Allergies    Actos [pioglitazone], Trandolapril, Azithromycin, Other, and Tetracycline  Review of Systems   Review of Systems  All other systems reviewed and are negative. Ten systems reviewed and are negative for acute change, except as noted in the HPI.   Physical Exam Updated Vital Signs BP (!) 157/87 (BP Location: Right Arm)   Pulse 63   Temp 97.6 F (36.4 C)   Resp 20   Ht 5\' 6"  (1.676 m)   Wt 76 kg   SpO2 97%   BMI 27.04 kg/m   Physical Exam Vitals and nursing note reviewed.  Constitutional:      General: He is not in acute distress.    Appearance: Normal appearance. He is not ill-appearing, toxic-appearing or diaphoretic.  HENT:     Head: Normocephalic and atraumatic.     Right Ear: Tympanic membrane, ear canal and external ear normal. There is no impacted cerumen.     Left Ear: Tympanic membrane, ear canal and external ear normal. There is no impacted cerumen.     Ears:     Comments: Bilateral ears, EACs, TMs all appear normal.    Nose: Nose normal.     Mouth/Throat:     Mouth: Mucous membranes are moist.     Pharynx: Oropharynx is clear. No oropharyngeal exudate or posterior oropharyngeal erythema.  Eyes:     Extraocular Movements: Extraocular movements intact.  Cardiovascular:     Rate and Rhythm: Normal rate and regular rhythm.     Pulses: Normal pulses.     Heart sounds: Normal heart sounds. No murmur heard. No friction rub. No gallop.   Pulmonary:     Effort: Pulmonary effort is normal. No respiratory distress.     Breath sounds: Normal breath sounds. No stridor. No wheezing, rhonchi or rales.  Abdominal:     General: Abdomen is flat.     Tenderness: There is no abdominal tenderness.  Musculoskeletal:        General: Normal range of motion.     Cervical back: Normal range of  motion and neck supple. No tenderness.  Skin:    General: Skin is warm and dry.  Neurological:     General: No focal deficit present.     Mental Status: He is alert and oriented to person, place, and time.     Comments: Patient is oriented to person, place, and time. Patient phonates in clear, complete, and coherent sentences. Negative arm drift. Finger to nose intact bilaterally with no visible signs of dysmetria. Strength is 5/5 in all four extremities. Distal sensation intact in all four extremities.  No corrective saccade noted with head impulse test.  Horizontal nystagmus noted.  Worsening dizziness noted with movement of patient's head.  Patient able to stand and ambulate unassisted.  Psychiatric:        Mood and Affect: Mood normal.        Behavior: Behavior normal.    ED Results / Procedures / Treatments   Labs (all labs ordered are listed, but only abnormal results are displayed) Labs Reviewed  CBC WITH DIFFERENTIAL/PLATELET - Abnormal; Notable for the following components:      Result Value   RBC 5.93 (*)    MCV 79.9 (*)    MCH 25.5 (*)    RDW 17.2 (*)    All other components within normal limits  BASIC METABOLIC PANEL - Abnormal; Notable for the following components:   Glucose, Bld 143 (*)    All other components within normal limits   EKG EKG Interpretation  Date/Time:  Wednesday February 26 2020 07:41:10 EST Ventricular Rate:  59 PR Interval:    QRS Duration: 89 QT Interval:  391 QTC Calculation: 388 R Axis:   12 Text Interpretation: Sinus rhythm Prolonged PR interval Borderline repolarization abnormality no change from previous tracings Confirmed by Charlesetta Shanks [phone]) on 02/26/2020 11:30:46 AM   Radiology CT Angio Head W or Wo Contrast  Result Date: 02/26/2020 CLINICAL DATA:  Possible aneurysm and abnormal left vertebral artery on MRI EXAM: CT ANGIOGRAPHY HEAD AND NECK TECHNIQUE: Multidetector CT imaging of the head and  neck was performed using the  standard protocol during bolus administration of intravenous contrast. Multiplanar CT image reconstructions and MIPs were obtained to evaluate the vascular anatomy. Carotid stenosis measurements (when applicable) are obtained utilizing NASCET criteria, using the distal internal carotid diameter as the denominator. CONTRAST:  52mL OMNIPAQUE IOHEXOL 350 MG/ML SOLN COMPARISON:  None. FINDINGS: CT HEAD Brain: There is no acute intracranial hemorrhage, mass effect, or edema. Gray-white differentiation is preserved. Patchy hypoattenuation in the supratentorial white matter is nonspecific but likely reflects chronic microvascular ischemic changes. There is no extra-axial fluid collection. Ventricles and sulci are within normal limits in size and configuration. Vascular: Better evaluated on CTA portion. Skull: Calvarium is unremarkable. Sinuses/Orbits: No acute finding. Other: None. Review of the MIP images confirms the above findings CTA NECK Aortic arch: Mild plaque along the aortic arch. Great vessel origins are patent. Right carotid system: Patent. Calcified plaque at the distal common carotid just below the bifurcation. There is no distal vessel measurement possible for NASCET criteria. Using the more proximal common carotid, there is approximately 50% stenosis. Calcified plaque at the ICA origin without measurable stenosis. Left carotid system: Patent. Mild calcified plaque at the ICA origin causing minimal stenosis. Vertebral arteries: Patent.  Right vertebral artery is dominant. Skeleton: Degenerative changes of the cervical spine. Other neck: No mass or adenopathy. Upper chest: No apical lung mass. Review of the MIP images confirms the above findings CTA HEAD Anterior circulation: Intracranial internal carotid arteries are patent with mixed plaque. There is moderate to marked stenosis in the paraclinoid region bilaterally. Anterior cerebral arteries are patent. An anterior communicating artery is present in the  location of questioned abnormality on MRI. However, there is no aneurysm identified. Middle cerebral arteries are patent. Posterior circulation: Intracranial right vertebral artery is patent. Intracranial left vertebral artery is patent and terminates as a PICA accounting for questioned abnormality on MRI. Basilar artery is patent and terminates as the patent left posterior cerebral artery. Mild stenosis of the left P2 PCA. Right posterior communicating artery is present with fetal origin of the patent right posterior cerebral artery. Venous sinuses: Patent as allowed by contrast bolus timing. 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 Anterior communicating artery is present in the region of questioned abnormality on MRI. However, no aneurysm identified. Approximately 50% stenosis of the right common carotid just below the bifurcation. No hemodynamically significant ICA origin stenosis. Mild left P2 PCA stenosis. Electronically Signed   By: Macy Mis M.D.   On: 02/26/2020 11:19   CT Angio Neck W and/or Wo Contrast  Result Date: 02/26/2020 CLINICAL DATA:  Possible aneurysm and abnormal left vertebral artery on MRI EXAM: CT ANGIOGRAPHY HEAD AND NECK TECHNIQUE: Multidetector CT imaging of the head and neck was performed using the standard protocol during bolus administration of intravenous contrast. Multiplanar CT image reconstructions and MIPs were obtained to evaluate the vascular anatomy. Carotid stenosis measurements (when applicable) are obtained utilizing NASCET criteria, using the distal internal carotid diameter as the denominator. CONTRAST:  39mL OMNIPAQUE IOHEXOL 350 MG/ML SOLN COMPARISON:  None. FINDINGS: CT HEAD Brain: There is no acute intracranial hemorrhage, mass effect, or edema. Gray-white differentiation is preserved. Patchy hypoattenuation in the supratentorial  white matter is nonspecific but likely reflects chronic microvascular ischemic changes. There is no extra-axial fluid collection. Ventricles and sulci are within normal limits in size and configuration. Vascular: Better evaluated on CTA  portion. Skull: Calvarium is unremarkable. Sinuses/Orbits: No acute finding. Other: None. Review of the MIP images confirms the above findings CTA NECK Aortic arch: Mild plaque along the aortic arch. Great vessel origins are patent. Right carotid system: Patent. Calcified plaque at the distal common carotid just below the bifurcation. There is no distal vessel measurement possible for NASCET criteria. Using the more proximal common carotid, there is approximately 50% stenosis. Calcified plaque at the ICA origin without measurable stenosis. Left carotid system: Patent. Mild calcified plaque at the ICA origin causing minimal stenosis. Vertebral arteries: Patent.  Right vertebral artery is dominant. Skeleton: Degenerative changes of the cervical spine. Other neck: No mass or adenopathy. Upper chest: No apical lung mass. Review of the MIP images confirms the above findings CTA HEAD Anterior circulation: Intracranial internal carotid arteries are patent with mixed plaque. There is moderate to marked stenosis in the paraclinoid region bilaterally. Anterior cerebral arteries are patent. An anterior communicating artery is present in the location of questioned abnormality on MRI. However, there is no aneurysm identified. Middle cerebral arteries are patent. Posterior circulation: Intracranial right vertebral artery is patent. Intracranial left vertebral artery is patent and terminates as a PICA accounting for questioned abnormality on MRI. Basilar artery is patent and terminates as the patent left posterior cerebral artery. Mild stenosis of the left P2 PCA. Right posterior communicating artery is present with fetal origin of the patent right posterior cerebral artery. Venous sinuses: Patent  as allowed by contrast bolus timing. 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 Anterior communicating artery is present in the region of questioned abnormality on MRI. However, no aneurysm identified. Approximately 50% stenosis of the right common carotid just below the bifurcation. No hemodynamically significant ICA origin stenosis. Mild left P2 PCA stenosis. Electronically Signed   By: Macy Mis M.D.   On: 02/26/2020 11:19   MR BRAIN WO CONTRAST  Result Date: 02/26/2020 CLINICAL DATA:  Intermittent dizziness EXAM: MRI HEAD WITHOUT CONTRAST TECHNIQUE: Multiplanar, multiecho pulse sequences of the brain and surrounding structures were obtained without intravenous contrast. COMPARISON:  None. FINDINGS: Brain: There is no acute infarction or intracranial hemorrhage. There is no intracranial mass, mass effect, or edema. There is no hydrocephalus or extra-axial fluid collection. Ventricles and sulci are within normal limits in size and configuration. Patchy T2 hyperintensity in the supratentorial white matter is nonspecific probably reflects mild to moderate chronic microvascular ischemic changes. Vascular: 9 mm T2 hypointense structure in the region of the anterior communicating artery (series 5, image 11). Poor visualization intracranial left vertebral artery flow void. This may be related to congenitally small size or proximal stenosis. Skull and upper cervical spine: Normal marrow signal is preserved. Sinuses/Orbits: Paranasal sinuses are aerated. Orbits are unremarkable. Other: Sella is unremarkable.  Mastoid air cells are clear. IMPRESSION: No evidence of recent infarction, hemorrhage, or mass. Mild to moderate chronic microvascular ischemic changes. Possible aneurysm in the region of the anterior communicating artery. Poor visualization of intracranial left vertebral artery may  be related to congenitally small size or proximal stenosis. MRA/CTA recommended for further evaluation. Electronically Signed   By: Macy Mis M.D.   On: 02/26/2020 09:19    Procedures Procedures   Medications Ordered in ED Medications  meclizine (ANTIVERT) tablet 25 mg (25 mg Oral Given 02/26/20 0737)  iohexol (OMNIPAQUE) 350 MG/ML injection 80 mL (80 mLs Intravenous Contrast Given 02/26/20 1056)  ED Course  I have reviewed the triage vital signs and the nursing notes.  Pertinent labs & imaging results that were available during my care of the patient were reviewed by me and considered in my medical decision making (see chart for details).  Clinical Course as of 02/26/20 1136  Wed Feb 26, 2020  0924 MR BRAIN WO CONTRAST IMPRESSION: No evidence of recent infarction, hemorrhage, or mass.  Mild to moderate chronic microvascular ischemic changes.  Possible aneurysm in the region of the anterior communicating artery. Poor visualization of intracranial left vertebral artery may be related to congenitally small size or proximal stenosis. MRA/CTA recommended for further evaluation. [LJ]  1610 MRI obtained. There is a possible aneurysm. Recommended CTA. This is been ordered. Discussed with patient and he is amenable. Reassessed and he states he is feeling much better after p.o. meclizine. We will continue to monitor. 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  Anterior communicating artery is present in the region of questioned abnormality on MRI. However, no aneurysm identified.  Approximately 50% stenosis of the right common carotid just below the bifurcation. No hemodynamically significant ICA origin stenosis.  Mild left P2 PCA stenosis. [LJ]    Clinical Course User Index [LJ] Rayna Sexton, PA-C   MDM Rules/Calculators/A&P                          Pt is  a 70 y.o. male who presents the emergency department due to dizziness that he began experiencing last night.  Labs: Patient with a hemoglobin of 15.1, MCV of 79.9, RDW of 17.2.  Hemoglobin within normal limits. BMP with a glucose of 143.  Otherwise, no abnormalities.  Imaging: MRI of the brain shows no evidence of recent infarction, hemorrhage, or mass.  They did note a possible aneurysm and recommended CTA. CTA obtained showing a congenitally smaller left vertebral artery which terminates as a PICA accounting for poor visualization on MRI.  Radiology notes an anterior communicating arteries present in the region of question abnormality on MRI.  However, no aneurysm is identified.  I, Rayna Sexton, PA-C, personally reviewed and evaluated these images and lab results as part of my medical decision-making.  Imaging findings discussed with patient and his wife who is at bedside.  Patient states he is feeling much better.  Patient given a dose of meclizine and denies any current nausea or dizziness.  States he has ambulated throughout the emergency department without difficulty.  Feel that patient is stable for discharge at this time and he is agreeable.  We will send a short prescription for Antivert.  We discussed safety regarding this medication.  Discussed return precautions at length.  His questions were answered and he was amicable at the time of discharge.  Note: Portions of this report may have been transcribed using voice recognition software. Every effort was made to ensure accuracy; however, inadvertent computerized transcription errors may be present.    Final Clinical Impression(s) / ED Diagnoses Final diagnoses:  Dizziness   Rx / DC Orders ED Discharge Orders         Ordered    meclizine (ANTIVERT) 12.5 MG tablet  2 times daily PRN        02/26/20 1133           Rayna Sexton, PA-C 02/26/20 1138    Charlesetta Shanks, MD 02/29/20  0832  

## 2020-02-26 NOTE — ED Notes (Signed)
PT transported to CT>

## 2020-02-26 NOTE — ED Triage Notes (Signed)
Patient reports intermittent dizziness for several days /unsteady gait , alert and oriented , denies pain/respirations unlabored.

## 2020-02-26 NOTE — Discharge Instructions (Addendum)
Like we discussed, I am prescribing you a very short course of meclizine.  This is the same medication that you were given in the emergency department for your dizziness.  Please only take this as prescribed.  Do not take more than prescribed.  This medication can be sedating, so please be aware that this can increase your risk of falling.  Do not mix with alcohol.  Do not operate a motor vehicle after taking it.  I have attached a large amount information on dizziness as well as vertigo.  Please reference this information with any questions.  If you develop worsening symptoms, please return to the emergency department for reevaluation.  Otherwise, please follow-up with your regular doctor.

## 2020-02-26 NOTE — ED Notes (Signed)
Patient verbalizes understanding of discharge instructions. Opportunity for questioning and answers were provided. Armband removed by staff, pt discharged from ED and ambulated to lobby to go home with spouse.

## 2020-03-01 ENCOUNTER — Other Ambulatory Visit: Payer: Self-pay

## 2020-03-01 ENCOUNTER — Emergency Department (HOSPITAL_COMMUNITY)
Admission: EM | Admit: 2020-03-01 | Discharge: 2020-03-02 | Disposition: A | Discharge status: Home / Self Care | Payer: Medicare Other | Attending: Emergency Medicine | Admitting: Emergency Medicine

## 2020-03-01 ENCOUNTER — Encounter (HOSPITAL_COMMUNITY): Payer: Self-pay | Admitting: Emergency Medicine

## 2020-03-01 DIAGNOSIS — H81399 Other peripheral vertigo, unspecified ear: Secondary | ICD-10-CM

## 2020-03-01 DIAGNOSIS — Z79899 Other long term (current) drug therapy: Secondary | ICD-10-CM | POA: Insufficient documentation

## 2020-03-01 DIAGNOSIS — R42 Dizziness and giddiness: Secondary | ICD-10-CM | POA: Diagnosis not present

## 2020-03-01 DIAGNOSIS — E11319 Type 2 diabetes mellitus with unspecified diabetic retinopathy without macular edema: Secondary | ICD-10-CM | POA: Diagnosis not present

## 2020-03-01 DIAGNOSIS — Z7984 Long term (current) use of oral hypoglycemic drugs: Secondary | ICD-10-CM | POA: Diagnosis not present

## 2020-03-01 DIAGNOSIS — I10 Essential (primary) hypertension: Secondary | ICD-10-CM | POA: Diagnosis not present

## 2020-03-01 DIAGNOSIS — Z7982 Long term (current) use of aspirin: Secondary | ICD-10-CM | POA: Diagnosis not present

## 2020-03-01 DIAGNOSIS — R110 Nausea: Secondary | ICD-10-CM | POA: Diagnosis not present

## 2020-03-01 LAB — CBC
HCT: 45.7 % (ref 39.0–52.0)
Hemoglobin: 14.9 g/dL (ref 13.0–17.0)
MCH: 25.9 pg — ABNORMAL LOW (ref 26.0–34.0)
MCHC: 32.6 g/dL (ref 30.0–36.0)
MCV: 79.3 fL — ABNORMAL LOW (ref 80.0–100.0)
Platelets: 264 10*3/uL (ref 150–400)
RBC: 5.76 MIL/uL (ref 4.22–5.81)
RDW: 17.5 % — ABNORMAL HIGH (ref 11.5–15.5)
WBC: 7.7 10*3/uL (ref 4.0–10.5)
nRBC: 0 % (ref 0.0–0.2)

## 2020-03-01 LAB — BASIC METABOLIC PANEL
Anion gap: 13 (ref 5–15)
BUN: 12 mg/dL (ref 8–23)
CO2: 24 mmol/L (ref 22–32)
Calcium: 9 mg/dL (ref 8.9–10.3)
Chloride: 100 mmol/L (ref 98–111)
Creatinine, Ser: 0.95 mg/dL (ref 0.61–1.24)
GFR, Estimated: >60 mL/min (ref >60)
Glucose, Bld: 115 mg/dL — ABNORMAL HIGH (ref 70–99)
Potassium: 3.5 mmol/L (ref 3.5–5.1)
Sodium: 137 mmol/L (ref 135–145)

## 2020-03-01 MED ORDER — MECLIZINE HCL 25 MG PO TABS
25.0000 mg | ORAL_TABLET | Freq: Once | ORAL | Status: AC
  Administered 2020-03-02: 25 mg via ORAL
  Filled 2020-03-01: qty 1

## 2020-03-01 MED ORDER — ONDANSETRON 4 MG PO TBDP
4.0000 mg | ORAL_TABLET | Freq: Once | ORAL | Status: AC | PRN
  Administered 2020-03-01: 4 mg via ORAL
  Filled 2020-03-01: qty 1

## 2020-03-01 NOTE — ED Triage Notes (Signed)
Pt c/o intermittent dizziness today. Seen on 2/23 for same. States he has not been able to take the prescribed meclizine. Alert and oriented.

## 2020-03-01 NOTE — ED Provider Notes (Addendum)
[facility] EMERGENCY DEPARTMENT Provider Note   CSN: [id number] Arrival date & time: 03/01/20  2011     History Chief Complaint  Patient presents with  . Dizziness    [name] is a 70 y.o. male.  The history is provided by the patient.  Dizziness Quality:  Room spinning Severity:  Severe Onset quality:  Gradual Timing:  Intermittent Progression:  Unchanged Chronicity:  New Context: head movement   Relieved by:  Nothing Worsened by:  Nothing Ineffective treatments:  None tried Associated symptoms: nausea   Associated symptoms: no blood in stool   Risk factors: hx of vertigo   Risk factors: no anemia   Patient with vertigo who was seen and had CTA and MRI presents with recurrent vertigo     Past Medical History:  Diagnosis Date  . Arthritis    right shoulder  . BPH (benign prostatic hyperplasia)   . Hyperlipidemia   . Hypertension   . Impotence of organic origin   . Iron deficiency anemia   . Mild atherosclerosis of carotid artery, bilateral    last doppler 12-17-2010   bilateral ICA 1-39%  . Nocturia more than twice per night   . Testicular hypofunction   . Type 2 diabetes mellitus [facility])    endocrinologist-- dr Loanne Drilling  . Weak urinary stream   . Wears glasses     Patient Active Problem List   Diagnosis Date Noted  . Palpitations 01/16/2020  . Chest pain of uncertain etiology 29/52/8413  . Heart murmur 01/16/2020  . Diabetes mellitus with coincident hypertension ([place]) 01/16/2020  . Diabetic retinopathy ([place]) 11/02/2015  . Screening for ischemic heart disease 10/26/2015  . Immunity status testing 10/26/2015  . Acute prostatitis 01/06/2015  . Edema 08/22/2014  . Angioedema of lips 07/14/2014  . Screening, ischemic heart disease 03/17/2014  . Visit for preventive health examination 03/17/2014  . Swelling of upper lip 10/20/2013  . Hyperlipidemia with target LDL less than 100 04/14/2013  . Hypertension 04/14/2013  . Need for  prophylactic vaccination with tetanus-diphtheria (Td) 04/14/2013  . Weakness 05/03/2010  . Special screening for malignant neoplasm of prostate 05/03/2010  . 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 TESTICULAR HYPOFUNCTION 09/22/2008  . DIZZINESS 08/14/2008  . KNEE PAIN 05/29/2008  . GLOSSITIS 06/27/2007  . Diabetes ([place]) 03/27/2007  . CEREBROVASCULAR DISEASE 03/27/2007  . FIBROMYALGIA 11/09/2006    Past Surgical History:  Procedure Laterality Date  . CYSTOSCOPY WITH INSERTION OF UROLIFT N/A 12/12/2017   Procedure: CYSTOSCOPY WITH INSERTION OF UROLIFT;  Surgeon: Festus Aloe, MD;  Location: [facility];  Service: Urology;  Laterality: N/A;  . SHOULDER SURGERY Right 2004, 1990  . WISDOM TOOTH EXTRACTION         Family History  Problem Relation Age of Onset  . Heart disease Mother 109       Deceased  . Heart attack Mother   . Hypertension Mother   . Diabetes Father 59       Deceased  . Hypertension Father   . Heart disease Sister        heart trouble  . Heart attack Sister   . Diabetes Brother   . Heart attack Brother   . Cancer Paternal Aunt        x1-Bone  . Cancer Paternal Aunt        multiple  . Heart attack Maternal Grandmother   . Healthy Sister        x6  . Heart attack  Sister   . Colon cancer Neg Hx     Social History   Tobacco Use  . Smoking status: Never Smoker  . Smokeless tobacco: Never Used  Vaping Use  . Vaping Use: Never used  Substance Use Topics  . Alcohol use: Not Currently    Alcohol/week: 0.0 standard drinks  . Drug use: No    Home Medications Prior to Admission medications   Medication Sig Start Date End Date Taking? Authorizing Provider  acyclovir (ZOVIRAX) 800 MG tablet Take 800 mg by mouth daily as needed (for flare-up). 02/15/13   [provider]  aspirin 81 MG tablet Take 81 mg by mouth daily.    [provider]  atenolol (TENORMIN) 50 MG tablet TAKE 1 TABLET DAILY Patient taking  differently: Take 50 mg by mouth daily. 05/28/19   Shelda Pal, DO  clotrimazole-betamethasone (LOTRISONE) cream Apply 1 application topically at bedtime. To feet Patient taking differently: Apply 1 application topically daily as needed (to feet). 05/14/19   Shelda Pal, DO  FREESTYLE LITE test strip USE 1 STRIP DAILY TO MONITOR GLUCOSE LEVELS 10/17/19   Renato Shin, MD  JARDIANCE 25 MG TABS tablet TAKE 1 TABLET DAILY Patient taking differently: Take 25 mg by mouth daily. 12/23/19   Elayne Snare, MD  Lancets (FREESTYLE) lancets 1 each by Other route daily. Use to monitor glucose levels daily; E11.9 09/19/18   Renato Shin, MD  meclizine (ANTIVERT) 12.5 MG tablet Take 1 tablet (12.5 mg total) by mouth 2 (two) times daily as needed for dizziness. 02/26/20   Rayna Sexton, PA-C  metFORMIN (GLUCOPHAGE-XR) 500 MG 24 hr tablet Take 2 tablets (1,000 mg total) by mouth 2 (two) times daily. 12/09/19   Elayne Snare, MD  Multiple Vitamin (MULTIVITAMIN) tablet Take 1 tablet by mouth daily.    [provider]  olopatadine (PATANOL) 0.1 % ophthalmic solution Apply 1 drop to eye daily as needed (for itchy and dry eyes).    [provider]  rosuvastatin (CRESTOR) 10 MG tablet TAKE 1 TABLET DAILY Patient taking differently: Take 10 mg by mouth daily. 07/23/19   Wendling, [name], DO  Semaglutide (RYBELSUS) 3 MG TABS Take 30 minutes before breakfast with water Patient taking differently: Take 3 mg by mouth daily. Take 30 minutes before breakfast with water 12/20/19   Elayne Snare, MD  sildenafil (VIAGRA) 100 MG tablet Take 100 mg by mouth daily as needed for erectile dysfunction. Use as directed    [provider]  testosterone cypionate (DEPOTESTOSTERONE CYPIONATE) 200 MG/ML injection Inject 200 mg into the muscle every 14 (fourteen) days. 11/19/19   [provider]  triamcinolone (KENALOG) 0.1 % Apply 1 application topically at bedtime. 12/06/19    [provider]    Allergies    Actos [pioglitazone], Trandolapril, Azithromycin, Other, and Tetracycline  Review of Systems   Review of Systems  Gastrointestinal: Positive for nausea. Negative for blood in stool.  Neurological: Positive for dizziness.    Physical Exam Updated Vital Signs BP 128/71 (BP Location: Right Arm)   Pulse (!) 56   Temp 97.6 F (36.4 C) (Oral)   Resp 16   SpO2 99%   Physical Exam Vitals and nursing note reviewed.  Constitutional:      General: He is not in acute distress.    Appearance: Normal appearance.  HENT:     Head: Normocephalic and atraumatic.     Nose: Nose normal.  Eyes:     Extraocular Movements: Extraocular movements  intact.     Conjunctiva/sclera: Conjunctivae normal.     Pupils: Pupils are equal, round, and reactive to light.  Cardiovascular:     Rate and Rhythm: Normal rate and regular rhythm.     Pulses: Normal pulses.     Heart sounds: Normal heart sounds.  Pulmonary:     Effort: Pulmonary effort is normal.     Breath sounds: Normal breath sounds.  Abdominal:     General: Abdomen is flat. Bowel sounds are normal.     Tenderness: There is no abdominal tenderness. There is no guarding.  Musculoskeletal:        General: Normal range of motion.     Cervical back: Neck supple.  Skin:    General: Skin is warm and dry.     Capillary Refill: Capillary refill takes less than 2 seconds.  Neurological:     General: No focal deficit present.     Mental Status: He is alert.  Psychiatric:        Mood and Affect: Mood normal.        Behavior: Behavior normal.     ED Results / Procedures / Treatments   Labs (all labs ordered are listed, but only abnormal results are displayed) Results for orders placed or performed during the hospital encounter of 54/62/70  Basic metabolic panel  Result Value Ref Range   Sodium 137 135 - 145 mmol/L   Potassium 3.5 3.5 - 5.1 mmol/L   Chloride 100 98 - 111 mmol/L   CO2 24 22 - 32  mmol/L   Glucose, Bld 115 (H) 70 - 99 mg/dL   BUN 12 8 - 23 mg/dL   Creatinine, Ser 0.95 0.61 - 1.24 mg/dL   Calcium 9.0 8.9 - 10.3 mg/dL   GFR, Estimated >60 >60 mL/min   Anion gap 13 5 - 15  CBC  Result Value Ref Range   WBC 7.7 4.0 - 10.5 K/uL   RBC 5.76 4.22 - 5.81 MIL/uL   Hemoglobin 14.9 13.0 - 17.0 g/dL   HCT 45.7 39.0 - 52.0 %   MCV 79.3 (L) 80.0 - 100.0 fL   MCH 25.9 (L) 26.0 - 34.0 pg   MCHC 32.6 30.0 - 36.0 g/dL   RDW 17.5 (H) 11.5 - 15.5 %   Platelets 264 150 - 400 K/uL   nRBC 0.0 0.0 - 0.2 %   CT Angio Head W or Wo Contrast  Result Date: 02/26/2020 CLINICAL DATA:  Possible aneurysm and abnormal left vertebral artery on MRI EXAM: CT ANGIOGRAPHY HEAD AND NECK TECHNIQUE: Multidetector CT imaging of the head and neck was performed using the standard protocol during bolus administration of intravenous contrast. Multiplanar CT image reconstructions and MIPs were obtained to evaluate the vascular anatomy. Carotid stenosis measurements (when applicable) are obtained utilizing NASCET criteria, using the distal internal carotid diameter as the denominator. CONTRAST:  44mL OMNIPAQUE IOHEXOL 350 MG/ML SOLN COMPARISON:  None. FINDINGS: CT HEAD Brain: There is no acute intracranial hemorrhage, mass effect, or edema. Gray-white differentiation is preserved. Patchy hypoattenuation in the supratentorial white matter is nonspecific but likely reflects chronic microvascular ischemic changes. There is no extra-axial fluid collection. Ventricles and sulci are within normal limits in size and configuration. Vascular: Better evaluated on CTA portion. Skull: Calvarium is unremarkable. Sinuses/Orbits: No acute finding. Other: None. Review of the MIP images confirms the above findings CTA NECK Aortic arch: Mild plaque along the aortic arch. Great vessel origins are patent. Right carotid system: Patent. Calcified plaque at  the distal common carotid just below the bifurcation. There is no distal vessel  measurement possible for NASCET criteria. Using the more proximal common carotid, there is approximately 50% stenosis. Calcified plaque at the ICA origin without measurable stenosis. Left carotid system: Patent. Mild calcified plaque at the ICA origin causing minimal stenosis. Vertebral arteries: Patent.  Right vertebral artery is dominant. Skeleton: Degenerative changes of the cervical spine. Other neck: No mass or adenopathy. Upper chest: No apical lung mass. Review of the MIP images confirms the above findings CTA HEAD Anterior circulation: Intracranial internal carotid arteries are patent with mixed plaque. There is moderate to marked stenosis in the paraclinoid region bilaterally. Anterior cerebral arteries are patent. An anterior communicating artery is present in the location of questioned abnormality on MRI. However, there is no aneurysm identified. Middle cerebral arteries are patent. Posterior circulation: Intracranial right vertebral artery is patent. Intracranial left vertebral artery is patent and terminates as a PICA accounting for questioned abnormality on MRI. Basilar artery is patent and terminates as the patent left posterior cerebral artery. Mild stenosis of the left P2 PCA. Right posterior communicating artery is present with fetal origin of the patent right posterior cerebral artery. Venous sinuses: Patent as allowed by contrast bolus timing. 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 Anterior communicating artery is present in the region of questioned abnormality on MRI. However, no aneurysm identified. Approximately 50% stenosis of the right common carotid just below the bifurcation. No hemodynamically significant ICA origin stenosis. Mild left P2 PCA stenosis. Electronically Signed   By: Macy Mis M.D.   On: 02/26/2020 11:19   CT Angio Neck W and/or Wo  Contrast  Result Date: 02/26/2020 CLINICAL DATA:  Possible aneurysm and abnormal left vertebral artery on MRI EXAM: CT ANGIOGRAPHY HEAD AND NECK TECHNIQUE: Multidetector CT imaging of the head and neck was performed using the standard protocol during bolus administration of intravenous contrast. Multiplanar CT image reconstructions and MIPs were obtained to evaluate the vascular anatomy. Carotid stenosis measurements (when applicable) are obtained utilizing NASCET criteria, using the distal internal carotid diameter as the denominator. CONTRAST:  63mL OMNIPAQUE IOHEXOL 350 MG/ML SOLN COMPARISON:  None. FINDINGS: CT HEAD Brain: There is no acute intracranial hemorrhage, mass effect, or edema. Gray-white differentiation is preserved. Patchy hypoattenuation in the supratentorial white matter is nonspecific but likely reflects chronic microvascular ischemic changes. There is no extra-axial fluid collection. Ventricles and sulci are within normal limits in size and configuration. Vascular: Better evaluated on CTA portion. Skull: Calvarium is unremarkable. Sinuses/Orbits: No acute finding. Other: None. Review of the MIP images confirms the above findings CTA NECK Aortic arch: Mild plaque along the aortic arch. Great vessel origins are patent. Right carotid system: Patent. Calcified plaque at the distal common carotid just below the bifurcation. There is no distal vessel measurement possible for NASCET criteria. Using the more proximal common carotid, there is approximately 50% stenosis. Calcified plaque at the ICA origin without measurable stenosis. Left carotid system: Patent. Mild calcified plaque at the ICA origin causing minimal stenosis. Vertebral arteries: Patent.  Right vertebral artery is dominant. Skeleton: Degenerative changes of the cervical spine. Other neck: No mass or adenopathy. Upper chest: No apical lung mass. Review of the MIP images confirms the above findings CTA HEAD Anterior circulation:  Intracranial internal carotid arteries are patent with mixed plaque. There is moderate to marked stenosis in the paraclinoid region  bilaterally. Anterior cerebral arteries are patent. An anterior communicating artery is present in the location of questioned abnormality on MRI. However, there is no aneurysm identified. Middle cerebral arteries are patent. Posterior circulation: Intracranial right vertebral artery is patent. Intracranial left vertebral artery is patent and terminates as a PICA accounting for questioned abnormality on MRI. Basilar artery is patent and terminates as the patent left posterior cerebral artery. Mild stenosis of the left P2 PCA. Right posterior communicating artery is present with fetal origin of the patent right posterior cerebral artery. Venous sinuses: Patent as allowed by contrast bolus timing. 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 Anterior communicating artery is present in the region of questioned abnormality on MRI. However, no aneurysm identified. Approximately 50% stenosis of the right common carotid just below the bifurcation. No hemodynamically significant ICA origin stenosis. Mild left P2 PCA stenosis. Electronically Signed   By: Macy Mis M.D.   On: 02/26/2020 11:19   MR BRAIN WO CONTRAST  Result Date: 02/26/2020 CLINICAL DATA:  Intermittent dizziness EXAM: MRI HEAD WITHOUT CONTRAST TECHNIQUE: Multiplanar, multiecho pulse sequences of the brain and surrounding structures were obtained without intravenous contrast. COMPARISON:  None. FINDINGS: Brain: There is no acute infarction or intracranial hemorrhage. There is no intracranial mass, mass effect, or edema. There is no hydrocephalus or extra-axial fluid collection. Ventricles and sulci are within normal limits in size and configuration. Patchy T2 hyperintensity in the supratentorial  white matter is nonspecific probably reflects mild to moderate chronic microvascular ischemic changes. Vascular: 9 mm T2 hypointense structure in the region of the anterior communicating artery (series 5, image 11). Poor visualization intracranial left vertebral artery flow void. This may be related to congenitally small size or proximal stenosis. Skull and upper cervical spine: Normal marrow signal is preserved. Sinuses/Orbits: Paranasal sinuses are aerated. Orbits are unremarkable. Other: Sella is unremarkable.  Mastoid air cells are clear. IMPRESSION: No evidence of recent infarction, hemorrhage, or mass. Mild to moderate chronic microvascular ischemic changes. Possible aneurysm in the region of the anterior communicating artery. Poor visualization of intracranial left vertebral artery may be related to congenitally small size or proximal stenosis. MRA/CTA recommended for further evaluation. Electronically Signed   By: Macy Mis M.D.   On: 02/26/2020 09:19    EKG EKG Interpretation  Date/Time:  Sunday March 01 2020 20:19:31 EST Ventricular Rate:  62 PR Interval:  260 QRS Duration: 90 QT Interval:  406 QTC Calculation: 412 R Axis:   23 Text Interpretation: Sinus rhythm with 1st degree A-V block T wave abnormality, consider inferior ischemia Abnormal ECG No significant change since last tracing Confirmed by Dorie Rank [phone]) on 03/01/2020 8:42:23 PM   Radiology No results found.  Procedures Procedures   Medications Ordered in ED Medications  meclizine (ANTIVERT) tablet 25 mg (has no administration in time range)  ondansetron (ZOFRAN-ODT) disintegrating tablet 4 mg (4 mg Oral Given 03/01/20 2054)    ED Course  I have reviewed the triage vital signs and the nursing notes.  Pertinent labs & imaging results that were available during my care of the patient were reviewed by me and considered in my medical decision making (see chart for details).   Negative MRIs this week.   Treated in the ED.  Pick up your medication to take at home.    [name] was evaluated in Emergency Department on 03/01/2020 for the symptoms described in  the history of present illness. He was evaluated in the context of the global COVID-19 pandemic, which necessitated consideration that the patient might be at risk for infection with the SARS-CoV-2 virus that causes COVID-19. Institutional protocols and algorithms that pertain to the evaluation of patients at risk for COVID-19 are in a state of rapid change based on information released by regulatory bodies including the CDC and federal and state organizations. These policies and algorithms were followed during the patient's care in the ED.  Final Clinical Impression(s) / ED Diagnoses Return for intractable cough, coughing up blood, fevers >100.4 unrelieved by medication, shortness of breath, intractable vomiting, chest pain, shortness of breath, weakness, numbness, changes in speech, facial asymmetry, abdominal pain, passing out, Inability to tolerate liquids or food, cough, altered mental status or any concerns. No signs of systemic illness or infection. The patient is nontoxic-appearing on exam and vital signs are within normal limits.  I have reviewed the triage vital signs and the nursing notes. Pertinent labs & imaging results that were available during my care of the patient were reviewed by me and considered in my medical decision making (see chart for details). After history, exam, and medical workup I feel the patient has been appropriately medically screened and is safe for discharge home. Pertinent diagnoses were discussed with the patient. Patient was given return precautions.    Palumbo, April, MD 03/02/20 [place], April, MD 03/24/20 2310

## 2020-03-01 NOTE — ED Notes (Signed)
Patient had episode of emesis. Triage RN notified  

## 2020-03-02 DIAGNOSIS — R42 Dizziness and giddiness: Secondary | ICD-10-CM | POA: Diagnosis not present

## 2020-03-17 ENCOUNTER — Other Ambulatory Visit (INDEPENDENT_AMBULATORY_CARE_PROVIDER_SITE_OTHER): Payer: Medicare Other

## 2020-03-17 ENCOUNTER — Other Ambulatory Visit: Payer: Self-pay

## 2020-03-17 DIAGNOSIS — E1165 Type 2 diabetes mellitus with hyperglycemia: Secondary | ICD-10-CM

## 2020-03-17 LAB — BASIC METABOLIC PANEL
BUN: 14 mg/dL (ref 6–23)
CO2: 25 mEq/L (ref 19–32)
Calcium: 9.3 mg/dL (ref 8.4–10.5)
Chloride: 104 mEq/L (ref 96–112)
Creatinine, Ser: 0.92 mg/dL (ref 0.40–1.50)
GFR: 85.51 mL/min (ref >60.00)
Glucose, Bld: 124 mg/dL — ABNORMAL HIGH (ref 70–99)
Potassium: 3.8 mEq/L (ref 3.5–5.1)
Sodium: 140 mEq/L (ref 135–145)

## 2020-03-17 LAB — HEMOGLOBIN A1C: Hgb A1c MFr Bld: 7.1 % — ABNORMAL HIGH (ref 4.6–6.5)

## 2020-03-19 NOTE — Progress Notes (Signed)
Patient ID: [name], male   DOB: 03/15/1950, 70 y.o.   MRN: [id number]           Reason for Appointment: Follow-up for Type 2 Diabetes   History of Present Illness:          Date of diagnosis of type 2 diabetes mellitus:    1996     Background history:  Initially was on Metformin and possibly other medication but details are not available His A1c since 2016 has ranged between 6 and 8.1, mostly in the low 7 range He was started on Invokana in 2017 and also was given acarbose and Prandin in 2018 in addition He has also been prescribed bromocriptine to take at bedtime  Recent history:   A1c is about the same at 7.1 Previous fructosamine is 239  Non-insulin hypoglycemic drugs currently: Rybelsus 3 mg daily, Jardiance 25 mg, Metformin 2 g  Current management, blood sugar patterns and problems identified:  His Rybelsus was changed to 3 mg because of tendency to weight loss with 7 mg Rybelsus  Since his last visit he has gained a pound  Blood sugars are being checked mostly after breakfast or before breakfast  Overall blood sugars are fairly stable except occasionally high before or after 12 noon  Does not think he has any decreased appetite  Has been tolerating his Metformin and Jardiance without side effects  He has not done a lot of formal exercise but is just trying to be active around home  At breakfast time he is eating mostly oatmeal and banana or sometimes only fruit, only occasionally eggs for protein  No nausea with Rybelsus  He was asked to try only half of the [place] but he is still taking a whole tablet        Side effects from medications have been: None  Typical meal intake: Breakfast is oatmeal or eggs and toast.  Lunch usually fruit.  Dinner is usually vegetables, usually avoiding fried food and carbohydrates like rice and pasta               Glucose monitoring:  done 1 times a day         Glucometer:  Freestyle lite.       Blood Glucose  readings by download of meter:   PRE-MEAL Fasting Lunch Dinner Bedtime Overall  Glucose range:  102-137  129-191  104  102-191  Mean/median:     129   POST-MEAL PC Breakfast PC Lunch PC Dinner  Glucose range:    154  Mean/median:       PREVIOUS data: FASTING range 104-124 After meals only checked after breakfast with a range of 167-216 AVERAGE overall 142   Dietician visit, most recent: None few years ago  Weight history: Usually has not had much fluctuation in his weight  Wt Readings from Last 3 Encounters:  03/20/20 155 lb 9.6 oz (70.6 kg)  02/26/20 167 lb 8.8 oz (76 kg)  01/16/20 157 lb 9.6 oz (71.5 kg)    Glycemic control:   Lab Results  Component Value Date   HGBA1C 7.1 (H) 03/17/2020   HGBA1C 7.0 (H) 12/17/2019   HGBA1C 8.1 (A) 07/02/2019   Lab Results  Component Value Date   MICROALBUR <0.7 09/04/2019   LDLCALC 59 09/04/2019   CREATININE 0.92 03/17/2020   Lab Results  Component Value Date   MICRALBCREAT 0.8 09/04/2019    Lab Results  Component Value Date   FRUCTOSAMINE 239 10/03/2019  Lab on 03/17/2020  Component Date Value Ref Range Status  . Sodium 03/17/2020 140  135 - 145 mEq/L Final  . Potassium 03/17/2020 3.8  3.5 - 5.1 mEq/L Final  . Chloride 03/17/2020 104  96 - 112 mEq/L Final  . CO2 03/17/2020 25  19 - 32 mEq/L Final  . Glucose, Bld 03/17/2020 124* 70 - 99 mg/dL Final  . BUN 03/17/2020 14  6 - 23 mg/dL Final  . Creatinine, Ser 03/17/2020 0.92  0.40 - 1.50 mg/dL Final  . GFR 03/17/2020 85.51  >60.00 mL/min Final   Calculated using the CKD-EPI Creatinine Equation (2021)  . Calcium 03/17/2020 9.3  8.4 - 10.5 mg/dL Final  . Hgb A1c MFr Bld 03/17/2020 7.1* 4.6 - 6.5 % Final   Glycemic Control Guidelines for People with Diabetes:Non Diabetic:  <6%Goal of Therapy: <7%Additional Action Suggested:  >8%     Allergies as of 03/20/2020      Reactions   Actos [pioglitazone] Swelling   edema   Trandolapril Swelling    w/ Angioedema    Azithromycin Rash   Other Rash   MAGIC MOUTHWASH   Tetracycline Rash      Medication List       Accurate as of March 20, 2020  9:33 PM. If you have any questions, ask your nurse or doctor.        acyclovir 800 MG tablet Commonly known as: ZOVIRAX Take 800 mg by mouth daily as needed (for flare-up).   aspirin 81 MG tablet Take 81 mg by mouth daily.   atenolol 50 MG tablet Commonly known as: TENORMIN TAKE 1 TABLET DAILY   clotrimazole-betamethasone cream Commonly known as: LOTRISONE Apply 1 application topically at bedtime. To feet What changed:   when to take this  reasons to take this  additional instructions   freestyle lancets 1 each by Other route daily. Use to monitor glucose levels daily; E11.9   FREESTYLE LITE test strip Generic drug: glucose blood USE 1 STRIP DAILY TO MONITOR GLUCOSE LEVELS   Jardiance 25 MG Tabs tablet Generic drug: empagliflozin TAKE 1 TABLET DAILY What changed: how much to take   meclizine 12.5 MG tablet Commonly known as: ANTIVERT Take 1 tablet (12.5 mg total) by mouth 2 (two) times daily as needed for dizziness.   metFORMIN 500 MG 24 hr tablet Commonly known as: GLUCOPHAGE-XR Take 2 tablets (1,000 mg total) by mouth 2 (two) times daily.   multivitamin tablet Take 1 tablet by mouth daily.   olopatadine 0.1 % ophthalmic solution Commonly known as: PATANOL Apply 1 drop to eye daily as needed (for itchy and dry eyes).   rosuvastatin 10 MG tablet Commonly known as: CRESTOR TAKE 1 TABLET DAILY   Rybelsus 3 MG Tabs Generic drug: Semaglutide Take 30 minutes before breakfast with water What changed:   how much to take  how to take this  when to take this   sildenafil 100 MG tablet Commonly known as: VIAGRA Take 100 mg by mouth daily as needed for erectile dysfunction. Use as directed   testosterone cypionate 200 MG/ML injection Commonly known as: DEPOTESTOSTERONE CYPIONATE Inject 200 mg into the muscle every 14  (fourteen) days.   triamcinolone 0.1 % Commonly known as: KENALOG Apply 1 application topically at bedtime.       Allergies:  Allergies  Allergen Reactions  . Actos [Pioglitazone] Swelling    edema  . Trandolapril Swelling     w/ Angioedema  . Azithromycin Rash  . Other Rash  MAGIC MOUTHWASH  . Tetracycline Rash    Past Medical History:  Diagnosis Date  . Arthritis    right shoulder  . BPH (benign prostatic hyperplasia)   . Hyperlipidemia   . Hypertension   . Impotence of organic origin   . Iron deficiency anemia   . Mild atherosclerosis of carotid artery, bilateral    last doppler 12-17-2010   bilateral ICA 1-39%  . Nocturia more than twice per night   . Testicular hypofunction   . Type 2 diabetes mellitus [facility])    endocrinologist-- dr Loanne Drilling  . Weak urinary stream   . Wears glasses     Past Surgical History:  Procedure Laterality Date  . CYSTOSCOPY WITH INSERTION OF UROLIFT N/A 12/12/2017   Procedure: CYSTOSCOPY WITH INSERTION OF UROLIFT;  Surgeon: Festus Aloe, MD;  Location: [facility];  Service: Urology;  Laterality: N/A;  . SHOULDER SURGERY Right 2004, 1990  . WISDOM TOOTH EXTRACTION      Family History  Problem Relation Age of Onset  . Heart disease Mother 32       Deceased  . Heart attack Mother   . Hypertension Mother   . Diabetes Father 30       Deceased  . Hypertension Father   . Heart disease Sister        heart trouble  . Heart attack Sister   . Diabetes Brother   . Heart attack Brother   . Cancer Paternal Aunt        x1-Bone  . Cancer Paternal Aunt        multiple  . Heart attack Maternal Grandmother   . Healthy Sister        x6  . Heart attack Sister   . Colon cancer Neg Hx     Social History:  reports that he has never smoked. He has never used smokeless tobacco. He reports previous alcohol use. He reports that he does not use drugs.   Review of Systems   Lipid history: Being treated with  rosuvastatin 10 mg by PCP, lipid panel s follows    Lab Results  Component Value Date   CHOL 118 09/04/2019   HDL 41.70 09/04/2019   LDLCALC 59 09/04/2019   LDLDIRECT 146.5 10/29/2012   TRIG 86.0 09/04/2019   CHOLHDL 3 09/04/2019           Hypertension: Has been treated with atenolol only and followed by PCP  BP Readings from Last 3 Encounters:  03/20/20 122/78  03/02/20 128/73  02/26/20 (!) 142/84    Most recent eye exam was in 8/21, has had stable background retinopathy  Most recent foot exam: 9/21  Currently known complications of diabetes: Background retinopathy  LABS:  Lab on 03/17/2020  Component Date Value Ref Range Status  . Sodium 03/17/2020 140  135 - 145 mEq/L Final  . Potassium 03/17/2020 3.8  3.5 - 5.1 mEq/L Final  . Chloride 03/17/2020 104  96 - 112 mEq/L Final  . CO2 03/17/2020 25  19 - 32 mEq/L Final  . Glucose, Bld 03/17/2020 124* 70 - 99 mg/dL Final  . BUN 03/17/2020 14  6 - 23 mg/dL Final  . Creatinine, Ser 03/17/2020 0.92  0.40 - 1.50 mg/dL Final  . GFR 03/17/2020 85.51  >60.00 mL/min Final   Calculated using the CKD-EPI Creatinine Equation (2021)  . Calcium 03/17/2020 9.3  8.4 - 10.5 mg/dL Final  . Hgb A1c MFr Bld 03/17/2020 7.1* 4.6 - 6.5 % Final  Glycemic Control Guidelines for People with Diabetes:Non Diabetic:  <6%Goal of Therapy: <7%Additional Action Suggested:  >8%     Physical Examination:  BP 122/78 (BP Location: Left Arm, Patient Position: Sitting, Cuff Size: Normal)   Pulse 71   Resp 18   Ht 5\' 7"  (1.702 m)   Wt 155 lb 9.6 oz (70.6 kg)   SpO2 97%   BMI 24.37 kg/m        ASSESSMENT:  Diabetes type 2 non-insulin-dependent  See history of present illness for detailed discussion of current diabetes management, blood sugar patterns and problems identified  Prior to adding Rybelsus A1c is 8.1 but now stable around 7  Now taking 3 mg Rybelsus instead of 7 mg, Metformin and Jardiance 25 mg  His weight loss has stabilized now  and he did not complain of decreased appetite    PLAN:      Discussed how to add protein to breakfast daily  If he has boost he will need to take only diabetic boost  Continue medications unchanged  Consider reducing Jardiance if he has any further weight loss  Regular exercise  Patient Instructions  Protein with all meals   Exercise!  Check blood sugars on waking up 2-3 days a week  Also check blood sugars about 2 hours after meals and do this after different meals by rotation  Recommended blood sugar levels on waking up are 90-130 and about 2 hours after meal is 130-160  Please bring your blood sugar monitor to each visit, thank you      Elayne Snare 03/20/2020, 9:33 PM   Note: This office note was prepared with Dragon voice recognition system technology. Any transcriptional errors that result from this process are unintentional.

## 2020-03-20 ENCOUNTER — Ambulatory Visit (INDEPENDENT_AMBULATORY_CARE_PROVIDER_SITE_OTHER): Payer: Medicare Other | Admitting: Endocrinology

## 2020-03-20 ENCOUNTER — Other Ambulatory Visit: Payer: Self-pay

## 2020-03-20 ENCOUNTER — Encounter: Payer: Self-pay | Admitting: Endocrinology

## 2020-03-20 VITALS — BP 122/78 | HR 71 | Resp 18 | Ht 67.0 in | Wt 155.6 lb

## 2020-03-20 DIAGNOSIS — E1165 Type 2 diabetes mellitus with hyperglycemia: Secondary | ICD-10-CM | POA: Diagnosis not present

## 2020-03-20 NOTE — Patient Instructions (Signed)
Protein with all meals   Exercise!  Check blood sugars on waking up 2-3 days a week  Also check blood sugars about 2 hours after meals and do this after different meals by rotation  Recommended blood sugar levels on waking up are 90-130 and about 2 hours after meal is 130-160  Please bring your blood sugar monitor to each visit, thank you

## 2020-03-24 ENCOUNTER — Other Ambulatory Visit: Payer: Medicare Other

## 2020-03-27 ENCOUNTER — Ambulatory Visit: Payer: Medicare Other | Admitting: Endocrinology

## 2020-04-08 ENCOUNTER — Encounter: Payer: Self-pay | Admitting: Internal Medicine

## 2020-04-08 ENCOUNTER — Other Ambulatory Visit: Payer: Self-pay

## 2020-04-08 ENCOUNTER — Ambulatory Visit (INDEPENDENT_AMBULATORY_CARE_PROVIDER_SITE_OTHER): Payer: Medicare Other | Admitting: Internal Medicine

## 2020-04-08 VITALS — BP 124/72 | HR 72 | Ht 66.5 in | Wt 158.0 lb

## 2020-04-08 DIAGNOSIS — I152 Hypertension secondary to endocrine disorders: Secondary | ICD-10-CM

## 2020-04-08 DIAGNOSIS — E1159 Type 2 diabetes mellitus with other circulatory complications: Secondary | ICD-10-CM | POA: Diagnosis not present

## 2020-04-08 DIAGNOSIS — E1169 Type 2 diabetes mellitus with other specified complication: Secondary | ICD-10-CM

## 2020-04-08 DIAGNOSIS — E785 Hyperlipidemia, unspecified: Secondary | ICD-10-CM

## 2020-04-08 DIAGNOSIS — I358 Other nonrheumatic aortic valve disorders: Secondary | ICD-10-CM | POA: Diagnosis not present

## 2020-04-08 NOTE — Progress Notes (Signed)
Cardiology Office Note:    Date:  04/08/2020   ID:  [name], DOB 12/03/1950, MRN [id number]  PCP:  Shelda Pal, DO  [place] Cardiologist:  Rudean Haskell MD [place] Electrophysiologist:  None   CC: Chest Pain follow up  History of Present Illness:    [name] is a 70 y.o. male with a hx of DM with HTN, HLD who presents for evaluation 01/16/20.  In interim of this visit, patient has had two ED visits and has been diagnosed with vertigo.  Seen 04/08/20.  Had ZioPatch and Echocardiogram that were relatively benign.  Patient notes that he is doing good.  Since last visit notes no further CP changes.  Relevant interval testing or therapy include Normal Echo and Zio.    No chest pain or pressure.  No SOB/DOE and no PND/Orthopnea.  No weight gain or leg swelling.  No palpitations or syncope and did not have any on the monitor.  Ambulatory blood pressure doing good; wife has been taking it and doing well but she was unable to come today.   Past Medical History:  Diagnosis Date  . Arthritis    right shoulder  . BPH (benign prostatic hyperplasia)   . Hyperlipidemia   . Hypertension   . Impotence of organic origin   . Iron deficiency anemia   . Mild atherosclerosis of carotid artery, bilateral    last doppler 12-17-2010   bilateral ICA 1-39%  . Nocturia more than twice per night   . Testicular hypofunction   . Type 2 diabetes mellitus [facility])    endocrinologist-- dr Loanne Drilling  . Weak urinary stream   . Wears glasses     Past Surgical History:  Procedure Laterality Date  . CYSTOSCOPY WITH INSERTION OF UROLIFT N/A 12/12/2017   Procedure: CYSTOSCOPY WITH INSERTION OF UROLIFT;  Surgeon: Festus Aloe, MD;  Location: [facility];  Service: Urology;  Laterality: N/A;  . SHOULDER SURGERY Right 2004, 1990  . WISDOM TOOTH EXTRACTION      Current Medications: Current Meds  Medication Sig  . acyclovir (ZOVIRAX) 800 MG tablet Take  800 mg by mouth daily as needed (for flare-up).  [name] aspirin 81 MG tablet Take 81 mg by mouth daily.  [name] atenolol (TENORMIN) 50 MG tablet TAKE 1 TABLET DAILY  . clotrimazole-betamethasone (LOTRISONE) cream Apply 1 application topically at bedtime. To feet  . FREESTYLE LITE test strip USE 1 STRIP DAILY TO MONITOR GLUCOSE LEVELS  . JARDIANCE 25 MG TABS tablet TAKE 1 TABLET DAILY  . Lancets (FREESTYLE) lancets 1 each by Other route daily. Use to monitor glucose levels daily; E11.9  . meclizine (ANTIVERT) 12.5 MG tablet Take 1 tablet (12.5 mg total) by mouth 2 (two) times daily as needed for dizziness.  . metFORMIN (GLUCOPHAGE-XR) 500 MG 24 hr tablet Take 2 tablets (1,000 mg total) by mouth 2 (two) times daily.  . Multiple Vitamin (MULTIVITAMIN) tablet Take 1 tablet by mouth daily.  [name] olopatadine (PATANOL) 0.1 % ophthalmic solution Apply 1 drop to eye daily as needed (for itchy and dry eyes).  . rosuvastatin (CRESTOR) 10 MG tablet TAKE 1 TABLET DAILY  . Semaglutide (RYBELSUS) 3 MG TABS Take 30 minutes before breakfast with water  . sildenafil (VIAGRA) 100 MG tablet Take 100 mg by mouth daily as needed for erectile dysfunction. Use as directed  . testosterone cypionate (DEPOTESTOSTERONE CYPIONATE) 200 MG/ML injection Inject 200 mg into the muscle every 14 (fourteen) days.  [name] triamcinolone (KENALOG) 0.1 %  Apply 1 application topically at bedtime.     Allergies:   Actos [pioglitazone], Trandolapril, Azithromycin, Other, and Tetracycline   Social History   Socioeconomic History  . Marital status: Married    Spouse name: [name]  . Number of children: 0  . Years of education: Not on file  . Highest education level: Not on file  Occupational History    Employer: [employer]  Tobacco Use  . Smoking status: Never Smoker  . Smokeless tobacco: Never Used  Vaping Use  . Vaping Use: Never used  Substance and Sexual Activity  . Alcohol use: Not Currently    Alcohol/week: 0.0 standard  drinks  . Drug use: No  . Sexual activity: Yes  Other Topics Concern  . Not on file  Social History Narrative   Regular exercise-yes   Caffeine-2 cups   Married=wife [name] 22 years   Cigar Smoker-quit smoking Jan 2010   Social Determinants of Radio broadcast assistant Strain: Not on file  Food Insecurity: Not on file  Transportation Needs: Not on file  Physical Activity: Not on file  Stress: Not on file  Social Connections: Not on file     Family History: The patient's family history includes Cancer in his paternal aunt and paternal aunt; Diabetes in his brother; Diabetes (age of onset: 87) in his father; Healthy in his sister; Heart attack in his brother, maternal grandmother, mother, sister, and sister; Heart disease in his sister; Heart disease (age of onset: 47) in his mother; Hypertension in his father and mother. There is no history of Colon cancer. History of coronary artery disease notable for brother, mother, grandmother, and sisters. History of heart failure notable for no members. History of arrhythmia notable for no members.  ROS:   Please see the history of present illness.    All other systems reviewed and are negative.  EKGs/Labs/Other Studies Reviewed:    The following studies were reviewed today:  EKG:   01/16/20: SR 1st HB rate 69 with inferolateral TWI and LVH (secondary repolarization   12/12/2017: Sinus bradycardia 1st heart block rate 46, LVH  Cardiac Event Monitoring: Date: 02/07/20 Results:  Patient had a minimum heart rate of 51 bpm, maximum heart rate of 156 bpm, and average heart rate of 74 bpm.  Predominant underlying rhythm was sinus rhythm.  Twelve runs of supraventricular tachycardia occurred lasting 15 beats at longest with a max rate of 156 bpm at fastest.  Isolated PACs were rare (<1.0%), with rare couplets and triplets present.  Isolated PVCs were rare (<1.0%), with rare couplets and triplets present.  Bundle Branch Block/IVCD was  present.  No triggered and diary events.   No malignant arrhythmias.   Transthoracic Echocardiogram: Date: 01/22/20 Results: No WMAs, while the is some calcification and annular calcium, PSAX looks OK 1. Left ventricular ejection fraction, by estimation, is 60 to 65%. The  left ventricle has normal function. The left ventricle has no regional  wall motion abnormalities. There is mild left ventricular hypertrophy.  Left ventricular diastolic parameters  were normal.  2. Right ventricular systolic function is normal. The right ventricular  size is normal.  3. The mitral valve is abnormal. Mild mitral valve regurgitation. No  evidence of mitral stenosis.  4. The aortic valve is tricuspid. There is moderate calcification of the  aortic valve. There is moderate thickening of the aortic valve. Aortic  valve regurgitation is not visualized. Mild to moderate aortic valve  sclerosis/calcification is present,  without any  evidence of aortic stenosis.  5. The inferior vena cava is normal in size with greater than 50%  respiratory variability, suggesting right atrial pressure of 3 mmHg.    Recent Labs: 09/04/2019: ALT 38 03/01/2020: Hemoglobin 14.9; Platelets 264 03/17/2020: BUN 14; Creatinine, Ser 0.92; Potassium 3.8; Sodium 140  Recent Lipid Panel    Component Value Date/Time   CHOL 118 09/04/2019 1032   TRIG 86.0 09/04/2019 1032   HDL 41.70 09/04/2019 1032   CHOLHDL 3 09/04/2019 1032   VLDL 17.2 09/04/2019 1032   LDLCALC 59 09/04/2019 1032   LDLDIRECT 146.5 10/29/2012 0733    Risk Assessment/Calculations:     NA  Physical Exam:    VS:  BP 124/72   Pulse 72   Ht 5' 6.5" (1.689 m)   Wt 158 lb (71.7 kg)   SpO2 99%   BMI 25.12 kg/m     Wt Readings from Last 3 Encounters:  04/08/20 158 lb (71.7 kg)  03/20/20 155 lb 9.6 oz (70.6 kg)  02/26/20 167 lb 8.8 oz (76 kg)    Orthostatic Vitals: Supine:  BP 130/70  HR 62 Sitting:  BP 119/69  HR 64 Standing:  BP 122/73  HR  67 Prolonged Stand:  BP 126/80  HR 68  GEN:  Well nourished, well developed in no acute distress HEENT: Normal NECK: No JVD; No carotid bruits LYMPHATICS: No lymphadenopathy CARDIAC: RRR, II/VI systolic crescendo, no rubs, gallops RESPIRATORY:  Clear to auscultation without rales, wheezing or rhonchi  ABDOMEN: Soft, non-tender, non-distended MUSCULOSKELETAL:  No edema; No deformity  SKIN: Warm and dry NEUROLOGIC:  Alert and oriented x 3 PSYCHIATRIC:  Normal affect   ASSESSMENT:    1. Aortic valve sclerosis   2. Hypertension associated with diabetes ([place])   3. Hyperlipidemia associated with type 2 diabetes mellitus (HCC)    PLAN:    In order of problems listed above:  Chest Pain associated with palpitations (both have resolved) - continue ASA 81 mg QD, statin, and beta blocker therapies   -at this point do not plan to pursue additional ischemic testing  Hypertension with Diabetes Vertigo - ambulatory blood pressure < 130/80, will continue ambulatory BP monitoring; gave education on how to perform ambulatory blood pressure monitoring including the frequency and technique; goal ambulatory blood pressure < 130/80 on average - continue home medications: atenolol 50 - discussed diet (DASH/low sodium)  Aortic Sclerosis without stenosis- likely culprit of murmur Hyperlipidemia (mixed) -LDL goal less than 100 (at goal) -continue current statin - gave education on dietary changes  One year follow up unless new symptoms or abnormal test results warranting change in plan Would be reasonable for  APP Follow up   Medication Adjustments/Labs and Tests Ordered: Current medicines are reviewed at length with the patient today.  Concerns regarding medicines are outlined above.  No orders of the defined types were placed in this encounter.  No orders of the defined types were placed in this encounter.   Patient Instructions  Medication Instructions:  Your physician recommends that  you continue on your current medications as directed. Please refer to the Current Medication list given to you today.  *If you need a refill on your cardiac medications before your next appointment, please call your pharmacy*   Lab Work: NONE If you have labs (blood work) drawn today and your tests are completely normal, you will receive your results only by: [name] MyChart Message (if you have MyChart) OR . A paper copy in the mail If  you have any lab test that is abnormal or we need to change your treatment, we will call you to review the results.   Testing/Procedures: NONE   Follow-Up: At [facility], you and your health needs are our priority.  As part of our continuing mission to provide you with exceptional heart care, we have created designated Provider Care Teams.  These Care Teams include your primary Cardiologist (physician) and Advanced Practice Providers (APPs -  Physician Assistants and Nurse Practitioners) who all work together to provide you with the care you need, when you need it.  We recommend signing up for the patient portal called "MyChart".  Sign up information is provided on this After Visit Summary.  MyChart is used to connect with patients for Virtual Visits (Telemedicine).  Patients are able to view lab/test results, encounter notes, upcoming appointments, etc.  Non-urgent messages can be sent to your provider as well.   To learn more about what you can do with MyChart, go to [email or web address].    Your next appointment:   12 year(s)  The format for your next appointment:   In Person  Provider:   You may see Rudean Haskell, MD or one of the following Advanced Practice Providers on your designated Care Team:    Melina Copa, PA-C  Ermalinda Barrios, PA-C          Signed, Werner Lean, MD  04/08/2020 8:49 AM    [place]

## 2020-04-08 NOTE — Patient Instructions (Signed)
Medication Instructions:  Your physician recommends that you continue on your current medications as directed. Please refer to the Current Medication list given to you today.  *If you need a refill on your cardiac medications before your next appointment, please call your pharmacy*   Lab Work: NONE If you have labs (blood work) drawn today and your tests are completely normal, you will receive your results only by: [name] MyChart Message (if you have MyChart) OR . A paper copy in the mail If you have any lab test that is abnormal or we need to change your treatment, we will call you to review the results.   Testing/Procedures: NONE   Follow-Up: At [facility], you and your health needs are our priority.  As part of our continuing mission to provide you with exceptional heart care, we have created designated Provider Care Teams.  These Care Teams include your primary Cardiologist (physician) and Advanced Practice Providers (APPs -  Physician Assistants and Nurse Practitioners) who all work together to provide you with the care you need, when you need it.  We recommend signing up for the patient portal called "MyChart".  Sign up information is provided on this After Visit Summary.  MyChart is used to connect with patients for Virtual Visits (Telemedicine).  Patients are able to view lab/test results, encounter notes, upcoming appointments, etc.  Non-urgent messages can be sent to your provider as well.   To learn more about what you can do with MyChart, go to [email or web address].    Your next appointment:   12 year(s)  The format for your next appointment:   In Person  Provider:   You may see Rudean Haskell, MD or one of the following Advanced Practice Providers on your designated Care Team:    Melina Copa, PA-C  Ermalinda Barrios, PA-C

## 2020-04-16 ENCOUNTER — Telehealth: Payer: Self-pay | Admitting: Endocrinology

## 2020-04-16 NOTE — Telephone Encounter (Signed)
Per patient incorrect RX for Rybelsus was sent to [facility] on 04/07/20.  He received 30 days worth not 90 days worth and dosage was for 1.5 MG per day not 3 MG per day as prescribed by Dr Dwyane Dee.    Please correct RX dosage, dosing instructions, and 90 day amount resend to [facility].  Patient feels like he should not have to pay for "our error".  Please contact at [phone]

## 2020-04-20 ENCOUNTER — Other Ambulatory Visit: Payer: Self-pay | Admitting: *Deleted

## 2020-04-20 MED ORDER — METFORMIN HCL ER 500 MG PO TB24: 1000.0000 mg | ORAL_TABLET | Freq: Two times a day (BID) | ORAL | 3 refills | Status: DC

## 2020-04-20 MED ORDER — RYBELSUS 3 MG PO TABS: ORAL_TABLET | ORAL | 3 refills | Status: DC

## 2020-04-20 NOTE — Telephone Encounter (Signed)
I spoke with patient today, rx was resent for correct amount and 90 day supply.

## 2020-04-21 ENCOUNTER — Telehealth: Payer: Self-pay | Admitting: Endocrinology

## 2020-04-21 NOTE — Telephone Encounter (Signed)
Pt came by the office to pick up medication from front desk

## 2020-05-16 ENCOUNTER — Other Ambulatory Visit: Payer: Self-pay | Admitting: Family Medicine

## 2020-05-22 ENCOUNTER — Telehealth: Payer: Self-pay | Admitting: Family Medicine

## 2020-05-22 NOTE — Telephone Encounter (Signed)
Pt, called very irate want to know why are we wasting his money by not prescribing him a 90 days refill, pt was told he needs to schedule an  office visit, but patient decide not too  Please advice

## 2020-05-25 NOTE — Telephone Encounter (Signed)
It's because he hasn't been seen in our office since January of 2021. Ty.

## 2020-05-25 NOTE — Telephone Encounter (Signed)
Patient called extremely irritated, stating "y'all don't care about me" and "I have a fix income." Patient was yelling, "who is going to pay the $12"? I tried to explain that the reason why he got 30 pills is because his doctor would like him to schedule an appointment. Patient refuses to make an  appt.

## 2020-06-17 ENCOUNTER — Telehealth: Payer: Self-pay | Admitting: Endocrinology

## 2020-06-17 NOTE — Telephone Encounter (Signed)
RX REQUEST:  Rybelsus (60 day)  PHARMACY:  [place], [place] - [address]  [address] [place] [address]  Phone:  [phone]  Fax:  [phone]

## 2020-06-19 MED ORDER — RYBELSUS 3 MG PO TABS: ORAL_TABLET | ORAL | 3 refills | Status: DC

## 2020-06-19 NOTE — Telephone Encounter (Signed)
Resent as requested.  

## 2020-06-23 NOTE — Telephone Encounter (Signed)
[name]  with [facility] or Pharmacist who answers the phone requests to be called at ph# [phone] re: AC received for Rybelsus  Reference# [id number]

## 2020-06-26 NOTE — Telephone Encounter (Signed)
Received fax from [facility]. Dr Dwyane Dee signed and I faxed back.

## 2020-06-28 ENCOUNTER — Other Ambulatory Visit: Payer: Self-pay | Admitting: Endocrinology

## 2020-06-28 ENCOUNTER — Other Ambulatory Visit: Payer: Self-pay | Admitting: Family Medicine

## 2020-06-28 DIAGNOSIS — E1159 Type 2 diabetes mellitus with other circulatory complications: Secondary | ICD-10-CM

## 2020-07-14 ENCOUNTER — Other Ambulatory Visit: Payer: Self-pay

## 2020-07-14 ENCOUNTER — Other Ambulatory Visit (INDEPENDENT_AMBULATORY_CARE_PROVIDER_SITE_OTHER): Payer: Medicare Other

## 2020-07-14 DIAGNOSIS — E1165 Type 2 diabetes mellitus with hyperglycemia: Secondary | ICD-10-CM | POA: Diagnosis not present

## 2020-07-14 LAB — BASIC METABOLIC PANEL
BUN: 13 mg/dL (ref 6–23)
CO2: 25 mEq/L (ref 19–32)
Calcium: 9 mg/dL (ref 8.4–10.5)
Chloride: 102 mEq/L (ref 96–112)
Creatinine, Ser: 0.92 mg/dL (ref 0.40–1.50)
GFR: 85.31 mL/min (ref >60.00)
Glucose, Bld: 108 mg/dL — ABNORMAL HIGH (ref 70–99)
Potassium: 3.5 mEq/L (ref 3.5–5.1)
Sodium: 138 mEq/L (ref 135–145)

## 2020-07-14 LAB — HEMOGLOBIN A1C: Hgb A1c MFr Bld: 7.1 % — ABNORMAL HIGH (ref 4.6–6.5)

## 2020-07-21 ENCOUNTER — Other Ambulatory Visit: Payer: Medicare Other

## 2020-07-24 ENCOUNTER — Ambulatory Visit (INDEPENDENT_AMBULATORY_CARE_PROVIDER_SITE_OTHER): Payer: Medicare Other | Admitting: Endocrinology

## 2020-07-24 ENCOUNTER — Encounter: Payer: Self-pay | Admitting: Endocrinology

## 2020-07-24 ENCOUNTER — Other Ambulatory Visit: Payer: Self-pay

## 2020-07-24 VITALS — BP 140/70 | HR 65 | Ht 66.5 in | Wt 160.2 lb

## 2020-07-24 DIAGNOSIS — E119 Type 2 diabetes mellitus without complications: Secondary | ICD-10-CM | POA: Diagnosis not present

## 2020-07-24 DIAGNOSIS — E785 Hyperlipidemia, unspecified: Secondary | ICD-10-CM | POA: Diagnosis not present

## 2020-07-24 NOTE — Progress Notes (Signed)
Patient ID: [name], male   DOB: 02-06-1950, 70 y.o.   MRN: [id number]           Reason for Appointment: Follow-up for Type 2 Diabetes   History of Present Illness:          Date of diagnosis of type 2 diabetes mellitus:    1996     Background history:  Initially was on Metformin and possibly other medication but details are not available His A1c since 2016 has ranged between 6 and 8.1, mostly in the low 7 range He was started on Invokana in 2017 and also was given acarbose and Prandin in 2018 in addition He has also been prescribed bromocriptine to take at bedtime  Recent history:   A1c is the same at 7.1 Previous fructosamine is 239  Non-insulin hypoglycemic drugs currently: Rybelsus 3 mg daily, Jardiance 25 mg, Metformin 2 g  Current management, blood sugar patterns and problems identified: He still shows fairly good blood sugars on 20 mg Rybelsus, while having weight loss with 70 mg  Since his last visit he has gained another 2 pound Blood sugars were evaluated from recall since he did not bring his monitor Previously was having readings as high as 191 postprandially  He is checking his blood sugars mostly before and after breakfast and some before dinner Highest readings are probably still after breakfast  Sometimes after waking up he will have a higher reading which he thinks is from eating more food the night before Thinks he is getting significant exercise with just being active outside with various activities, no formal walking or exercise program At breakfast time he is still sometimes having oatmeal but is trying to add more protein in the mornings also He has seen the dietitian in 2021 No side effects from 25 mg Jardiance        Side effects from medications have been: None  Typical meal intake: Breakfast is oatmeal or eggs and toast.  Lunch usually fruit.  Dinner is usually vegetables, usually avoiding fried food and carbohydrates like rice and pasta                Glucose monitoring:  done 1 times a day         Glucometer:  Freestyle lite.       Blood Glucose readings by recall   PRE-MEAL Fasting Lunch Dinner Bedtime Overall  Glucose range: 109-120  <120    Mean/median:        POST-MEAL PC Breakfast PC Lunch PC Dinner  Glucose range: 117-160  ?  Mean/median:      Previously   PRE-MEAL Fasting Lunch Dinner Bedtime Overall  Glucose range:  102-137  129-191  104  102-191  Mean/median:     129   POST-MEAL PC Breakfast PC Lunch PC Dinner  Glucose range:    154  Mean/median:      Dietician visit, most recent: 10/2019  Weight history: Usually has not had much fluctuation in his weight  Wt Readings from Last 3 Encounters:  07/24/20 160 lb 3.2 oz (72.7 kg)  04/08/20 158 lb (71.7 kg)  03/20/20 155 lb 9.6 oz (70.6 kg)    Glycemic control:   Lab Results  Component Value Date   HGBA1C 7.1 (H) 07/14/2020   HGBA1C 7.1 (H) 03/17/2020   HGBA1C 7.0 (H) 12/17/2019   Lab Results  Component Value Date   MICROALBUR <0.7 09/04/2019   LDLCALC 59 09/04/2019   CREATININE 0.92 07/14/2020  Lab Results  Component Value Date   MICRALBCREAT 0.8 09/04/2019    Lab Results  Component Value Date   FRUCTOSAMINE 239 10/03/2019    No visits with results within 1 Week(s) from this visit.  Latest known visit with results is:  Lab on 07/14/2020  Component Date Value Ref Range Status   Sodium 07/14/2020 138  135 - 145 mEq/L Final   Potassium 07/14/2020 3.5  3.5 - 5.1 mEq/L Final   Chloride 07/14/2020 102  96 - 112 mEq/L Final   CO2 07/14/2020 25  19 - 32 mEq/L Final   Glucose, Bld 07/14/2020 108 (A) 70 - 99 mg/dL Final   BUN 07/14/2020 13  6 - 23 mg/dL Final   Creatinine, Ser 07/14/2020 0.92  0.40 - 1.50 mg/dL Final   GFR 07/14/2020 85.31  >60.00 mL/min Final   Calculated using the CKD-EPI Creatinine Equation (2021)   Calcium 07/14/2020 9.0  8.4 - 10.5 mg/dL Final   Hgb A1c MFr Bld 07/14/2020 7.1 (A) 4.6 - 6.5 % Final   Glycemic  Control Guidelines for People with Diabetes:Non Diabetic:  <6%Goal of Therapy: <7%Additional Action Suggested:  >8%     Allergies as of 07/24/2020       Reactions   Actos [pioglitazone] Swelling   edema   Trandolapril Swelling    w/ Angioedema   Azithromycin Rash   Other Rash   MAGIC MOUTHWASH   Tetracycline Rash        Medication List        Accurate as of July 24, 2020  9:38 AM. If you have any questions, ask your nurse or doctor.          acyclovir 800 MG tablet Commonly known as: ZOVIRAX Take 800 mg by mouth daily as needed (for flare-up).   aspirin 81 MG tablet Take 81 mg by mouth daily.   atenolol 50 MG tablet Commonly known as: TENORMIN Take 1 tablet (50 mg total) by mouth daily.   clotrimazole-betamethasone cream Commonly known as: LOTRISONE Apply 1 application topically at bedtime. To feet   freestyle lancets 1 each by Other route daily. Use to monitor glucose levels daily; E11.9   FREESTYLE LITE test strip Generic drug: glucose blood USE 1 STRIP DAILY TO MONITOR GLUCOSE LEVELS   Jardiance 25 MG Tabs tablet Generic drug: empagliflozin TAKE 1 TABLET DAILY   meclizine 12.5 MG tablet Commonly known as: ANTIVERT Take 1 tablet (12.5 mg total) by mouth 2 (two) times daily as needed for dizziness.   metFORMIN 500 MG 24 hr tablet Commonly known as: GLUCOPHAGE-XR Take 2 tablets (1,000 mg total) by mouth 2 (two) times daily.   multivitamin tablet Take 1 tablet by mouth daily.   olopatadine 0.1 % ophthalmic solution Commonly known as: PATANOL Apply 1 drop to eye daily as needed (for itchy and dry eyes).   rosuvastatin 10 MG tablet Commonly known as: CRESTOR TAKE 1 TABLET DAILY   Rybelsus 3 MG Tabs Generic drug: Semaglutide Take 1 tablet daily 30 minutes before breakfast with water   sildenafil 100 MG tablet Commonly known as: VIAGRA Take 100 mg by mouth daily as needed for erectile dysfunction. Use as directed   testosterone cypionate 200  MG/ML injection Commonly known as: DEPOTESTOSTERONE CYPIONATE Inject 200 mg into the muscle every 14 (fourteen) days.   triamcinolone cream 0.1 % Commonly known as: KENALOG Apply 1 application topically at bedtime.        Allergies:  Allergies  Allergen Reactions   Actos [  Pioglitazone] Swelling    edema   Trandolapril Swelling     w/ Angioedema   Azithromycin Rash   Other Rash    MAGIC MOUTHWASH   Tetracycline Rash    Past Medical History:  Diagnosis Date   Arthritis    right shoulder   BPH (benign prostatic hyperplasia)    Hyperlipidemia    Hypertension    Impotence of organic origin    Iron deficiency anemia    Mild atherosclerosis of carotid artery, bilateral    last doppler 12-17-2010   bilateral ICA 1-39%   Nocturia more than twice per night    Testicular hypofunction    Type 2 diabetes mellitus [facility])    endocrinologist-- dr Georgiana Shore urinary stream    Wears glasses     Past Surgical History:  Procedure Laterality Date   CYSTOSCOPY WITH INSERTION OF UROLIFT N/A 12/12/2017   Procedure: CYSTOSCOPY WITH INSERTION OF UROLIFT;  Surgeon: Festus Aloe, MD;  Location: [place];  Service: Urology;  Laterality: N/A;   SHOULDER SURGERY Right 2004, 1990   WISDOM TOOTH EXTRACTION      Family History  Problem Relation Age of Onset   Heart disease Mother 87       Deceased   Heart attack Mother    Hypertension Mother    Diabetes Father 3       Deceased   Hypertension Father    Heart disease Sister        heart trouble   Heart attack Sister    Diabetes Brother    Heart attack Brother    Cancer Paternal Aunt        x1-Bone   Cancer Paternal Aunt        multiple   Heart attack Maternal Grandmother    Healthy Sister        x6   Heart attack Sister    Colon cancer Neg Hx     Social History:  reports that he has never smoked. He has never used smokeless tobacco. He reports previous alcohol use. He reports that he does not use  drugs.   Review of Systems   Lipid history: Being treated with rosuvastatin 10 mg by PCP, lipid panel s follows    Lab Results  Component Value Date   CHOL 118 09/04/2019   HDL 41.70 09/04/2019   LDLCALC 59 09/04/2019   LDLDIRECT 146.5 10/29/2012   TRIG 86.0 09/04/2019   CHOLHDL 3 09/04/2019           Hypertension: Has been treated with atenolol only and followed by PCP  BP Readings from Last 3 Encounters:  07/24/20 140/70  04/08/20 124/72  03/20/20 122/78    Most recent eye exam was in 2/22, has had stable background retinopathy  Most recent foot exam: 9/21  Currently known complications of diabetes: Background retinopathy  LABS:  No visits with results within 1 Week(s) from this visit.  Latest known visit with results is:  Lab on 07/14/2020  Component Date Value Ref Range Status   Sodium 07/14/2020 138  135 - 145 mEq/L Final   Potassium 07/14/2020 3.5  3.5 - 5.1 mEq/L Final   Chloride 07/14/2020 102  96 - 112 mEq/L Final   CO2 07/14/2020 25  19 - 32 mEq/L Final   Glucose, Bld 07/14/2020 108 (A) 70 - 99 mg/dL Final   BUN 07/14/2020 13  6 - 23 mg/dL Final   Creatinine, Ser 07/14/2020 0.92  0.40 - 1.50  mg/dL Final   GFR 07/14/2020 85.31  >60.00 mL/min Final   Calculated using the CKD-EPI Creatinine Equation (2021)   Calcium 07/14/2020 9.0  8.4 - 10.5 mg/dL Final   Hgb A1c MFr Bld 07/14/2020 7.1 (A) 4.6 - 6.5 % Final   Glycemic Control Guidelines for People with Diabetes:Non Diabetic:  <6%Goal of Therapy: <7%Additional Action Suggested:  >8%     Physical Examination:  BP 140/70   Pulse 65   Ht 5' 6.5" (1.689 m)   Wt 160 lb 3.2 oz (72.7 kg)   SpO2 99%   BMI 25.47 kg/m   Exam not indicated     ASSESSMENT:  Diabetes type 2 non-insulin-dependent  See history of present illness for detailed discussion of current diabetes management, blood sugar patterns and problems identified  He is on a regimen of 3 mg Rybelsus, Metformin and Jardiance 25 mg  He is  doing well with A1c near 7% but home blood sugars are reportedly lowered than expected for his A1c Fasting lab glucose 108 However needs some monitoring after dinner also  His weight has leveled off and not having tendency to weight loss which he was concerned about previously  PLAN:     No change in medications More readings after dinner about 2 hours postprandially Discussed postprandial target Needs to have some protein with every meal and will avoid high glycemic index fruits  There are no Patient Instructions on file for this visit.    Elayne Snare 07/24/2020, 9:38 AM   Note: This office note was prepared with Dragon voice recognition system technology. Any transcriptional errors that result from this process are unintentional.

## 2020-07-24 NOTE — Patient Instructions (Signed)
OTC magnesium take at 1 daily

## 2020-10-22 ENCOUNTER — Other Ambulatory Visit: Payer: Self-pay | Admitting: Endocrinology

## 2020-11-23 ENCOUNTER — Other Ambulatory Visit: Payer: Medicare Other

## 2020-11-25 ENCOUNTER — Ambulatory Visit: Payer: Medicare Other | Admitting: Endocrinology

## 2020-12-16 ENCOUNTER — Other Ambulatory Visit: Payer: Self-pay

## 2020-12-16 ENCOUNTER — Other Ambulatory Visit (INDEPENDENT_AMBULATORY_CARE_PROVIDER_SITE_OTHER): Payer: Medicare Other

## 2020-12-16 DIAGNOSIS — E119 Type 2 diabetes mellitus without complications: Secondary | ICD-10-CM

## 2020-12-16 DIAGNOSIS — E785 Hyperlipidemia, unspecified: Secondary | ICD-10-CM | POA: Diagnosis not present

## 2020-12-16 LAB — COMPREHENSIVE METABOLIC PANEL
ALT: 27 U/L (ref 0–53)
AST: 26 U/L (ref 0–37)
Albumin: 4.2 g/dL (ref 3.5–5.2)
Alkaline Phosphatase: 80 U/L (ref 39–117)
BUN: 12 mg/dL (ref 6–23)
CO2: 30 mEq/L (ref 19–32)
Calcium: 9.5 mg/dL (ref 8.4–10.5)
Chloride: 100 mEq/L (ref 96–112)
Creatinine, Ser: 0.95 mg/dL (ref 0.40–1.50)
GFR: 81.84 mL/min (ref >60.00)
Glucose, Bld: 92 mg/dL (ref 70–99)
Potassium: 4.2 mEq/L (ref 3.5–5.1)
Sodium: 138 mEq/L (ref 135–145)
Total Bilirubin: 0.5 mg/dL (ref 0.2–1.2)
Total Protein: 6.6 g/dL (ref 6.0–8.3)

## 2020-12-16 LAB — LIPID PANEL
Cholesterol: 137 mg/dL (ref 0–200)
HDL: 49.6 mg/dL (ref >39.00)
LDL Cholesterol: 73 mg/dL (ref 0–99)
NonHDL: 87.61
Total CHOL/HDL Ratio: 3
Triglycerides: 75 mg/dL (ref 0.0–149.0)
VLDL: 15 mg/dL (ref 0.0–40.0)

## 2020-12-16 LAB — HEMOGLOBIN A1C: Hgb A1c MFr Bld: 6.9 % — ABNORMAL HIGH (ref 4.6–6.5)

## 2020-12-17 ENCOUNTER — Other Ambulatory Visit: Payer: Medicare Other

## 2020-12-29 ENCOUNTER — Other Ambulatory Visit: Payer: Medicare Other

## 2020-12-31 ENCOUNTER — Other Ambulatory Visit: Payer: Self-pay

## 2020-12-31 ENCOUNTER — Encounter: Payer: Self-pay | Admitting: Dietician

## 2020-12-31 ENCOUNTER — Encounter: Payer: Self-pay | Admitting: Endocrinology

## 2020-12-31 ENCOUNTER — Ambulatory Visit (INDEPENDENT_AMBULATORY_CARE_PROVIDER_SITE_OTHER): Payer: Medicare Other | Admitting: Endocrinology

## 2020-12-31 ENCOUNTER — Encounter: Payer: Medicare Other | Attending: Anesthesiology | Admitting: Dietician

## 2020-12-31 VITALS — BP 118/70 | HR 76 | Ht 67.0 in | Wt 153.0 lb

## 2020-12-31 DIAGNOSIS — E119 Type 2 diabetes mellitus without complications: Secondary | ICD-10-CM | POA: Diagnosis not present

## 2020-12-31 DIAGNOSIS — E1159 Type 2 diabetes mellitus with other circulatory complications: Secondary | ICD-10-CM | POA: Insufficient documentation

## 2020-12-31 DIAGNOSIS — E785 Hyperlipidemia, unspecified: Secondary | ICD-10-CM | POA: Diagnosis not present

## 2020-12-31 DIAGNOSIS — I10 Essential (primary) hypertension: Secondary | ICD-10-CM | POA: Diagnosis not present

## 2020-12-31 NOTE — Progress Notes (Signed)
Patient ID: [name], male   DOB: [birth year], 70 y.o.   MRN: [id number]           Reason for Appointment: Follow-up for Type 2 Diabetes   History of Present Illness:          Date of diagnosis of type 2 diabetes mellitus:    1996     Background history:  Initially was on Metformin and possibly other medication but details are not available His A1c since 2016 has ranged between 6 and 8.1, mostly in the low 7 range He was started on Invokana in 2017 and also was given acarbose and Prandin in 2018 in addition He has also been prescribed bromocriptine to take at bedtime  Recent history:   A1c is the lower at 6.9 compared to 7.1  Previous fructosamine is 239  Non-insulin hypoglycemic drugs currently: Rybelsus 3 mg daily, Jardiance 25 mg, Metformin 2 g  Current management, blood sugar patterns and problems identified: He has lost weight and this may be related to his eliminating high sodium foods especially fast food  Previously was having stable or slightly higher weight despite taking Rybelsus and Jardiance  He forgets to check his sugars after meals and mostly checking them before eating  Occasionally he thinks his sugars may be higher in the morning from forgetting the nighttime metformin which he takes at 8-9 PM  Previously did not have his meter for download He has not been doing much exercise because of recent problems with vertigo Occasional readings after breakfast appear to be fairly good He thinks he is getting some protein like eggs in the morning with his oatmeal He has seen the dietitian in 2021 No side effects on renal dysfunction from 25 mg Jardiance        Side effects from medications have been: None  Typical meal intake: Breakfast is oatmeal with eggs   Lunch usually fruit.  Dinner is usually vegetables, usually avoiding fried food and carbohydrates like rice and pasta               Glucose monitoring:  done 1 times a day         Glucometer:  Freestyle  lite.       Blood Glucose readings by meter download   PRE-MEAL Fasting Lunch Dinner Bedtime Overall  Glucose range:     96-148  Mean/median:        POST-MEAL PC Breakfast PC Lunch PC Dinner  Glucose range:     Mean/median:       PRE-MEAL Fasting Lunch Dinner Bedtime Overall  Glucose range: 109-120  <120    Mean/median:        POST-MEAL PC Breakfast PC Lunch PC Dinner  Glucose range: 117-160  ?  Mean/median:       Dietician visit, most recent: 10/2019  Weight history: Usually has not had much fluctuation in his weight  Wt Readings from Last 3 Encounters:  12/31/20 153 lb (69.4 kg)  07/24/20 160 lb 3.2 oz (72.7 kg)  04/08/20 158 lb (71.7 kg)    Glycemic control:   Lab Results  Component Value Date   HGBA1C 6.9 (H) 12/16/2020   HGBA1C 7.1 (H) 07/14/2020   HGBA1C 7.1 (H) 03/17/2020   Lab Results  Component Value Date   MICROALBUR <0.7 09/04/2019   LDLCALC 73 12/16/2020   CREATININE 0.95 12/16/2020   Lab Results  Component Value Date   MICRALBCREAT 0.8 09/04/2019    Lab Results  Component Value Date  FRUCTOSAMINE 239 10/03/2019    No visits with results within 1 Week(s) from this visit.  Latest known visit with results is:  Lab on 12/16/2020  Component Date Value Ref Range Status   Cholesterol 12/16/2020 137  0 - 200 mg/dL Final   ATP III Classification       Desirable:  < 200 mg/dL               Borderline High:  200 - 239 mg/dL          High:  > = 240 mg/dL   Triglycerides 12/16/2020 75.0  0.0 - 149.0 mg/dL Final   Normal:  <150 mg/dLBorderline High:  150 - 199 mg/dL   HDL 12/16/2020 49.60  >39.00 mg/dL Final   VLDL 12/16/2020 15.0  0.0 - 40.0 mg/dL Final   LDL Cholesterol 12/16/2020 73  0 - 99 mg/dL Final   Total CHOL/HDL Ratio 12/16/2020 3   Final                  Men          Women1/2 Average Risk     3.4          3.3Average Risk          5.0          4.42X Average Risk          9.6          7.13X Average Risk          15.0          11.0                        NonHDL 12/16/2020 87.61   Final   NOTE:  Non-HDL goal should be 30 mg/dL higher than patient's LDL goal (i.e. LDL goal of < 70 mg/dL, would have non-HDL goal of < 100 mg/dL)   Sodium 12/16/2020 138  135 - 145 mEq/L Final   Potassium 12/16/2020 4.2  3.5 - 5.1 mEq/L Final   Chloride 12/16/2020 100  96 - 112 mEq/L Final   CO2 12/16/2020 30  19 - 32 mEq/L Final   Glucose, Bld 12/16/2020 92  70 - 99 mg/dL Final   BUN 12/16/2020 12  6 - 23 mg/dL Final   Creatinine, Ser 12/16/2020 0.95  0.40 - 1.50 mg/dL Final   Total Bilirubin 12/16/2020 0.5  0.2 - 1.2 mg/dL Final   Alkaline Phosphatase 12/16/2020 80  39 - 117 U/L Final   AST 12/16/2020 26  0 - 37 U/L Final   ALT 12/16/2020 27  0 - 53 U/L Final   Total Protein 12/16/2020 6.6  6.0 - 8.3 g/dL Final   Albumin 12/16/2020 4.2  3.5 - 5.2 g/dL Final   GFR 12/16/2020 81.84  >60.00 mL/min Final   Calculated using the CKD-EPI Creatinine Equation (2021)   Calcium 12/16/2020 9.5  8.4 - 10.5 mg/dL Final   Hgb A1c MFr Bld 12/16/2020 6.9 (H)  4.6 - 6.5 % Final   Glycemic Control Guidelines for People with Diabetes:Non Diabetic:  <6%Goal of Therapy: <7%Additional Action Suggested:  >8%     Allergies as of 12/31/2020       Reactions   Actos [pioglitazone] Swelling   edema   Trandolapril Swelling    w/ Angioedema   Azithromycin Rash   Other Rash   MAGIC MOUTHWASH   Tetracycline Rash        Medication List  Accurate as of December 31, 2020  8:51 AM. If you have any questions, ask your nurse or doctor.          acyclovir 800 MG tablet Commonly known as: ZOVIRAX Take 800 mg by mouth daily as needed (for flare-up).   aspirin 81 MG tablet Take 81 mg by mouth daily.   atenolol 50 MG tablet Commonly known as: TENORMIN Take 1 tablet (50 mg total) by mouth daily.   clotrimazole-betamethasone cream Commonly known as: LOTRISONE Apply 1 application topically at bedtime. To feet   freestyle lancets 1 each by Other route  daily. Use to monitor glucose levels daily; E11.9   FREESTYLE LITE test strip Generic drug: glucose blood USE 1 STRIP DAILY TO MONITOR GLUCOSE LEVELS   Jardiance 25 MG Tabs tablet Generic drug: empagliflozin TAKE 1 TABLET DAILY   losartan 50 MG tablet Commonly known as: COZAAR   meclizine 12.5 MG tablet Commonly known as: ANTIVERT Take 1 tablet (12.5 mg total) by mouth 2 (two) times daily as needed for dizziness.   metFORMIN 500 MG 24 hr tablet Commonly known as: GLUCOPHAGE-XR Take 2 tablets (1,000 mg total) by mouth 2 (two) times daily.   multivitamin tablet Take 1 tablet by mouth daily.   olopatadine 0.1 % ophthalmic solution Commonly known as: PATANOL Apply 1 drop to eye daily as needed (for itchy and dry eyes).   rosuvastatin 10 MG tablet Commonly known as: CRESTOR TAKE 1 TABLET DAILY   Rybelsus 3 MG Tabs Generic drug: Semaglutide Take 1 tablet daily 30 minutes before breakfast with water   sildenafil 100 MG tablet Commonly known as: VIAGRA Take 100 mg by mouth daily as needed for erectile dysfunction. Use as directed   testosterone cypionate 200 MG/ML injection Commonly known as: DEPOTESTOSTERONE CYPIONATE Inject 200 mg into the muscle every 14 (fourteen) days.   triamcinolone cream 0.1 % Commonly known as: KENALOG Apply 1 application topically at bedtime.        Allergies:  Allergies  Allergen Reactions   Actos [Pioglitazone] Swelling    edema   Trandolapril Swelling     w/ Angioedema   Azithromycin Rash   Other Rash    MAGIC MOUTHWASH   Tetracycline Rash    Past Medical History:  Diagnosis Date   Arthritis    right shoulder   BPH (benign prostatic hyperplasia)    Hyperlipidemia    Hypertension    Impotence of organic origin    Iron deficiency anemia    Mild atherosclerosis of carotid artery, bilateral    last doppler 12-17-2010   bilateral ICA 1-39%   Nocturia more than twice per night    Testicular hypofunction    Type 2 diabetes  [facility])    endocrinologist-- dr Georgiana Shore urinary stream    Wears glasses     Past Surgical History:  Procedure Laterality Date   CYSTOSCOPY WITH INSERTION OF UROLIFT N/A 12/12/2017   Procedure: CYSTOSCOPY WITH INSERTION OF UROLIFT;  Surgeon: Festus Aloe, MD;  Location: [facility];  Service: Urology;  Laterality: N/A;   SHOULDER SURGERY Right 2004, 1990   WISDOM TOOTH EXTRACTION      Family History  Problem Relation Age of Onset   Heart disease Mother 73       Deceased   Heart attack Mother    Hypertension Mother    Diabetes Father 51       Deceased   Hypertension Father    Heart disease Sister  heart trouble   Heart attack Sister    Diabetes Brother    Heart attack Brother    Cancer Paternal Aunt        x1-Bone   Cancer Paternal Aunt        multiple   Heart attack Maternal Grandmother    Healthy Sister        x6   Heart attack Sister    Colon cancer Neg Hx     Social History:  reports that he has never smoked. He has never used smokeless tobacco. He reports that he does not currently use alcohol. He reports that he does not use drugs.   Review of Systems   Lipid history: Being treated with rosuvastatin 10 mg by PCP, lipid panel s follows    Lab Results  Component Value Date   CHOL 137 12/16/2020   HDL 49.60 12/16/2020   LDLCALC 73 12/16/2020   LDLDIRECT 146.5 10/29/2012   TRIG 75.0 12/16/2020   CHOLHDL 3 12/16/2020           Hypertension: Has been treated with atenolol only and followed by PCP  BP Readings from Last 3 Encounters:  12/31/20 118/70  07/24/20 140/70  04/08/20 124/72    Most recent eye exam was in 2/22, has had stable background retinopathy  Most recent foot exam: 12/22  Currently known complications of diabetes: Background retinopathy  LABS:  No visits with results within 1 Week(s) from this visit.  Latest known visit with results is:  Lab on 12/16/2020  Component Date Value Ref Range  Status   Cholesterol 12/16/2020 137  0 - 200 mg/dL Final   ATP III Classification       Desirable:  < 200 mg/dL               Borderline High:  200 - 239 mg/dL          High:  > = 240 mg/dL   Triglycerides 12/16/2020 75.0  0.0 - 149.0 mg/dL Final   Normal:  <150 mg/dLBorderline High:  150 - 199 mg/dL   HDL 12/16/2020 49.60  >39.00 mg/dL Final   VLDL 12/16/2020 15.0  0.0 - 40.0 mg/dL Final   LDL Cholesterol 12/16/2020 73  0 - 99 mg/dL Final   Total CHOL/HDL Ratio 12/16/2020 3   Final                  Men          Women1/2 Average Risk     3.4          3.3Average Risk          5.0          4.42X Average Risk          9.6          7.13X Average Risk          15.0          11.0                       NonHDL 12/16/2020 87.61   Final   NOTE:  Non-HDL goal should be 30 mg/dL higher than patient's LDL goal (i.e. LDL goal of < 70 mg/dL, would have non-HDL goal of < 100 mg/dL)   Sodium 12/16/2020 138  135 - 145 mEq/L Final   Potassium 12/16/2020 4.2  3.5 - 5.1 mEq/L Final   Chloride 12/16/2020 100  96 - 112 mEq/L Final  CO2 12/16/2020 30  19 - 32 mEq/L Final   Glucose, Bld 12/16/2020 92  70 - 99 mg/dL Final   BUN 12/16/2020 12  6 - 23 mg/dL Final   Creatinine, Ser 12/16/2020 0.95  0.40 - 1.50 mg/dL Final   Total Bilirubin 12/16/2020 0.5  0.2 - 1.2 mg/dL Final   Alkaline Phosphatase 12/16/2020 80  39 - 117 U/L Final   AST 12/16/2020 26  0 - 37 U/L Final   ALT 12/16/2020 27  0 - 53 U/L Final   Total Protein 12/16/2020 6.6  6.0 - 8.3 g/dL Final   Albumin 12/16/2020 4.2  3.5 - 5.2 g/dL Final   GFR 12/16/2020 81.84  >60.00 mL/min Final   Calculated using the CKD-EPI Creatinine Equation (2021)   Calcium 12/16/2020 9.5  8.4 - 10.5 mg/dL Final   Hgb A1c MFr Bld 12/16/2020 6.9 (H)  4.6 - 6.5 % Final   Glycemic Control Guidelines for People with Diabetes:Non Diabetic:  <6%Goal of Therapy: <7%Additional Action Suggested:  >8%     Physical Examination:  BP 118/70    Pulse 76    Ht 5\' 7"  (1.702 m)    Wt  153 lb (69.4 kg)    SpO2 99%    BMI 23.96 kg/m   Diabetic Foot Exam - Simple   Simple Foot Form Diabetic Foot exam was performed with the following findings: Yes   Visual Inspection No deformities, no ulcerations, no other skin breakdown bilaterally: Yes Sensation Testing Intact to touch and monofilament testing bilaterally: Yes Pulse Check Posterior Tibialis and Dorsalis pulse intact bilaterally: Yes Comments    No ankle edema    ASSESSMENT:  Diabetes type 2 non-insulin-dependent  See history of present illness for detailed discussion of current diabetes management, blood sugar patterns and problems identified  He is on a regimen of 3 mg Rybelsus, Metformin and Jardiance 25 mg  He is doing well with A1c now under 7% However his weight loss is likely related to significant changes in his diet he has done for other reasons Blood sugars are excellent in the mornings although has not checked many readings after meals but likely has good control Is trying to add more protein at breakfast as discussed However he is overall cutting back on calories and likely not getting consistent nutrition or balanced meals Recently not exercising also  LIPIDS: Excellent control on rosuvastatin  Mild hypertension: Well controlled and renal function also normal  PLAN:     No change in treatment regimen  Start checking readings after breakfast or dinner about 2 hours after eating  Discussed postprandial glucose levels  Needs to have some protein with every meal  He can add more snacks or boost in between meals especially with higher protein content We will discuss meal planning with dietitian to accommodate his low sodium needs However if he continues having more weight loss may consider stopping Jardiance  Given exercise regimen examples to be done indoors until he feels more comfortable walking outside  No change in rosuvastatin  There are no Patient Instructions on file for this  visit.    Elayne Snare 12/31/2020, 8:51 AM   Note: This office note was prepared with Dragon voice recognition system technology. Any transcriptional errors that result from this process are unintentional.

## 2020-12-31 NOTE — Patient Instructions (Addendum)
Unsalted nuts Ezekiel bread with unsalted peanut butter Apple with unsalted peanut butter Celery with unsalted peanut butter Chicken salad with low salt crackers or Ezekiel bread  Add to your salad:  (options)  Low sodium canned beans  Frozen green soybeans (shelled -Edemame)  Low sodium tuna  Homemade chicken salad  Unsalted sunflower seeds  Leftover boiled chicken  Diced apple  Green peas (thawed)   Add protein to all of your meals (fish, chicken, beans, eggs, nuts, peanut butter) Be sure to be drinking enough fluid

## 2020-12-31 NOTE — Progress Notes (Signed)
Diabetes Self-Management Education  Visit Type: First/Initial  Appt. Start Time: 1035 Appt. End Time: 1120  12/31/2020  [name], identified by name and date of birth, is a 70 y.o. male with a diagnosis of Diabetes: Type 2.   ASSESSMENT Patient is here today alone.  He was last seen by thi RD in 2021. He states that he was recently diagnosed with Meniere's disease which causes vertigo.  He would like to learn more about a low sodium diet (1500 mg) to help with this.  He has also been losing weight and wants to prevent further loss. He states that he has purchased Boost to supplement his diet.  History includes Type 2 Diabetes (1996), HTN, HLD. Medications include:  Rybelsus (but concerns that he is allergic due to itching), Jardiance, Metformin A1C 6.9% 12/14/2020 decreased from 7.1% 07/14/2020  Weight hx: 153 lbs 12/31/2020 160 lbs UBW  Patient lives with his wife.  She does the shopping and he does most of the cooking.  He eats simply.  Doesn't want to learn to cook much. He is retired from [employer]. Active some but states that he needs to resume the gym.  Weight 153 lb (69.4 kg). Body mass index is 23.96 kg/m.   Diabetes Self-Management Education - 12/31/20 1046       Visit Information   Visit Type First/Initial      Initial Visit   Diabetes Type Type 2    Are you currently following a meal plan? Yes   low sodium, diabetic   Are you taking your medications as prescribed? Yes      Psychosocial Assessment   Patient Belief/Attitude about Diabetes Motivated to manage diabetes    Self-care barriers Other (comment)   severe dizziness at times   Self-management support Doctor's office    Other persons present Patient    Patient Concerns Nutrition/Meal planning;Healthy Lifestyle    Special Needs None    Preferred Learning Style No preference indicated    Learning Readiness Ready    How often do you need to have someone help you when you read  instructions, pamphlets, or other written materials from your doctor or pharmacy? 1 - Never    What is the last grade level you completed in school? 4 years college      Pre-Education Assessment   Patient understands the diabetes disease and treatment process. Demonstrates understanding / competency    Patient understands incorporating nutritional management into lifestyle. Needs Review    Patient undertands incorporating physical activity into lifestyle. Needs Review    Patient understands using medications safely. Needs Review    Patient understands monitoring blood glucose, interpreting and using results Demonstrates understanding / competency    Patient understands prevention, detection, and treatment of acute complications. Demonstrates understanding / competency    Patient understands prevention, detection, and treatment of chronic complications. Demonstrates understanding / competency    Patient understands how to develop strategies to address psychosocial issues. Demonstrates understanding / competency    Patient understands how to develop strategies to promote health/change behavior. Needs Review      Complications   Last HgB A1C per patient/outside source 6.9 %   12/14/20 decreased form 7.1% 07/2020     Dietary Intake   Breakfast oatmeal, 1-2 eggs, occasional apple    Snack (morning) none    Lunch salad (lettuce, onions, other vegetables - no protein), LS popcorn   1-2   Snack (afternoon) occasional trail mix (low sodium) or celery  Dinner broiled chicken, sweet potato or baked potato, salad or broccoli    Snack (evening) none    Beverage(s) water, coffee with sugar free creamer with splenda (decaf), sugar free flavored water.      Exercise   Exercise Type Light (walking / raking leaves)   stationary bike   How many days per week to you exercise? 3      Patient Education   Previous Diabetes Education Yes (please comment)   2021   Nutrition management  Food label reading,  portion sizes and measuring food.;Meal options for control of blood glucose level and chronic complications.   low sodium, adequate nutrtion/protein   Physical activity and exercise  Role of exercise on diabetes management, blood pressure control and cardiac health.    Medications Reviewed patients medication for diabetes, action, purpose, timing of dose and side effects.    Personal strategies to promote health Lifestyle issues that need to be addressed for better diabetes care      Individualized Goals (developed by patient)   Nutrition Other (comment)   low sodium diabetic   Physical Activity Exercise 3-5 times per week;30 minutes per day    Medications take my medication as prescribed    Monitoring  test my blood glucose as discussed    Reducing Risk increase portions of healthy fats      Post-Education Assessment   Patient understands the diabetes disease and treatment process. Demonstrates understanding / competency    Patient understands incorporating nutritional management into lifestyle. Needs Review    Patient undertands incorporating physical activity into lifestyle. Demonstrates understanding / competency    Patient understands using medications safely. Demonstrates understanding / competency    Patient understands monitoring blood glucose, interpreting and using results Demonstrates understanding / competency    Patient understands prevention, detection, and treatment of acute complications. Demonstrates understanding / competency    Patient understands prevention, detection, and treatment of chronic complications. Demonstrates understanding / competency    Patient understands how to develop strategies to address psychosocial issues. Demonstrates understanding / competency    Patient understands how to develop strategies to promote health/change behavior. Needs Review      Outcomes   Expected Outcomes Demonstrated interest in learning. Expect positive outcomes    Future DMSE PRN     Program Status Completed             Individualized Plan for Diabetes Self-Management Training:   Learning Objective:  Patient will have a greater understanding of diabetes self-management. Patient education plan is to attend individual and/or group sessions per assessed needs and concerns.   Plan:   Patient Instructions  Unsalted nuts Ezekiel bread with unsalted peanut butter Apple with unsalted peanut butter Celery with unsalted peanut butter Chicken salad with low salt crackers or Ezekiel bread  Add to your salad:  (options)  Low sodium canned beans  Frozen green soybeans (shelled -Edemame)  Low sodium tuna  Homemade chicken salad  Unsalted sunflower seeds  Leftover boiled chicken  Diced apple  Green peas (thawed)   Add protein to all of your meals (fish, chicken, beans, eggs, nuts, peanut butter) Be sure to be drinking enough fluid  Expected Outcomes:  Demonstrated interest in learning. Expect positive outcomes  Education material provided: Low sodium nutrition therapy from AND, Heart Healthy label reading from AND  If problems or questions, patient to contact team via:  Phone  Future DSME appointment: PRN

## 2020-12-31 NOTE — Patient Instructions (Addendum)
Check blood sugars on waking up 2-3 days a week  Also check blood sugars about 2 hours after meals and do this after different meals by rotation  Recommended blood sugar levels on waking up are 90-130 and about 2 hours after meal is 130-160  Please bring your blood sugar monitor to each visit, thank you  May have protein snack in between meals  INDOOR EXERCISE IDEAS   Use the following examples for a creative indoor workout (perform each move for 2-3 minutes):  Warm up. Put on some music that makes you feel like moving, and dance around the living room. Watch exercise shows on TV and move along with them. There are tons of free cable channels that have daily exercise shows on them for all levels - beginner through advanced.  You can easily find a number of exercise videos but use one that will suit your liking and exercise level; you can do these on your own schedule. Walk up and down the steps. Do dumbbell curls and presses (if you don't have weights, use full water bottles). Do assisted squats, keeping your back on a fitness ball against the wall or using the back of the couch for support. Shadow box: Lift and lower the left leg; jab with the right arm, then the left; then lift and lower the right leg. Fence (you don't even need swords). Pretend you're holding a sword in each hand. Create an X pattern standing still, then moving forward and back. Hop on your exercise bike or treadmill -- or, for something different, use a weighted hula hoop. If you don't have any of those, just go back to dancing. Do abdominal crunches (hold a weighted ball for added resistance). Cool down with [employer] "I Feel Good" -- or whatever tune makes you feel good

## 2021-03-17 ENCOUNTER — Telehealth: Payer: Self-pay | Admitting: Internal Medicine

## 2021-03-17 NOTE — Telephone Encounter (Signed)
Pt with continuous, sharp chest pain since yesterday evening.  States it just gradually came on.  Does not radiate anywhere.  Pain is right around the left nipple area and stops when he pushes on the area.  Denies any heavy lifting or strenuous activity that could have caused a pulled muscle.  Denies nausea, diaphoresis, SOB, HA, vision disturbances or dizziness.  States had similar issues many years ago that eventually resolved on their own.  Vitals were 150/75, 78.  Advised I will get information to Dr.Chandrasekhar for review. ?

## 2021-03-17 NOTE — Telephone Encounter (Signed)
Pt c/o of Chest Pain: STAT if CP now or developed within 24 hours ? ?1. Are you having CP right now? Yes  ? ?2. Are you experiencing any other symptoms (ex. SOB, nausea, vomiting, sweating)? No  ? ?3. How long have you been experiencing CP? Yesterday evening ? ?4. Is your CP continuous or coming and going? continuous ? ?5. Have you taken Nitroglycerin? No  ??  ?

## 2021-03-17 NOTE — Telephone Encounter (Signed)
Called pt and scheduled appt in May.  Pt appreciative for call.  ?

## 2021-03-30 ENCOUNTER — Other Ambulatory Visit: Payer: Self-pay | Admitting: Endocrinology

## 2021-03-30 DIAGNOSIS — E1159 Type 2 diabetes mellitus with other circulatory complications: Secondary | ICD-10-CM

## 2021-04-29 ENCOUNTER — Other Ambulatory Visit: Payer: Medicare Other

## 2021-04-30 ENCOUNTER — Other Ambulatory Visit (INDEPENDENT_AMBULATORY_CARE_PROVIDER_SITE_OTHER): Payer: Medicare Other

## 2021-04-30 DIAGNOSIS — E119 Type 2 diabetes mellitus without complications: Secondary | ICD-10-CM | POA: Diagnosis not present

## 2021-04-30 LAB — BASIC METABOLIC PANEL
BUN: 12 mg/dL (ref 6–23)
CO2: 29 mEq/L (ref 19–32)
Calcium: 9.3 mg/dL (ref 8.4–10.5)
Chloride: 101 mEq/L (ref 96–112)
Creatinine, Ser: 0.91 mg/dL (ref 0.40–1.50)
GFR: 85.96 mL/min (ref >60.00)
Glucose, Bld: 99 mg/dL (ref 70–99)
Potassium: 4.2 mEq/L (ref 3.5–5.1)
Sodium: 138 mEq/L (ref 135–145)

## 2021-04-30 LAB — HEMOGLOBIN A1C: Hgb A1c MFr Bld: 7.1 % — ABNORMAL HIGH (ref 4.6–6.5)

## 2021-04-30 LAB — MICROALBUMIN / CREATININE URINE RATIO
Creatinine,U: 61.4 mg/dL
Microalb Creat Ratio: 1.1 mg/g (ref 0.0–30.0)
Microalb, Ur: <0.7 mg/dL (ref 0.0–1.9)

## 2021-05-04 ENCOUNTER — Encounter: Payer: Self-pay | Admitting: Endocrinology

## 2021-05-04 ENCOUNTER — Ambulatory Visit (INDEPENDENT_AMBULATORY_CARE_PROVIDER_SITE_OTHER): Payer: Medicare Other | Admitting: Endocrinology

## 2021-05-04 VITALS — BP 138/82 | HR 57 | Ht 66.5 in | Wt 156.0 lb

## 2021-05-04 DIAGNOSIS — E1165 Type 2 diabetes mellitus with hyperglycemia: Secondary | ICD-10-CM | POA: Diagnosis not present

## 2021-05-04 NOTE — Progress Notes (Signed)
?Patient ID: [name], male   DOB: 10-22-1950, 71 y.o.   MRN: [id number] ? ?       ? ? ?Reason for Appointment: Follow-up for Type 2 Diabetes ? ? ?History of Present Illness:  ?        ?Date of diagnosis of type 2 diabetes mellitus:    1996    ? ?Background history:  ?Initially was on Metformin and possibly other medication but details are not available ?His A1c since 2016 has ranged between 6 and 8.1, mostly in the low 7 range ?He was started on Invokana in 2017 and also was given acarbose and Prandin in 2018 in addition ?He has also been prescribed bromocriptine to take at bedtime ? ?Recent history:  ? ?A1c is 7.1 ? ?Previous fructosamine is 239 ? ?Non-insulin hypoglycemic drugs currently: Rybelsus 3 mg daily, Jardiance 25 mg, Metformin 2 g ? ?Current management, blood sugar patterns and problems identified: ?He has only checked blood sugars in the mornings despite reminders  ?He has somewhat variable fasting readings ?He thinks that most of his high readings fasting are related to his forgetting the metformin in the evening  ?He now says that he was understanding that he has to take metformin sometime after eating at night even though he takes his metformin with breakfast in the morning  ?His weight has improved  ?Currently not doing much exercise except for only once a week  ?He is trying to eat balanced meals and has protein like eggs in the morning  ?Renal function stable with Jardiance ? ?      ?Side effects from medications have been: None ? ?Typical meal intake: Breakfast is oatmeal with eggs   Lunch usually fruit.  Dinner is usually vegetables, usually avoiding fried food and carbohydrates like rice and pasta              ? ?Glucose monitoring:  done 1 times a day         Glucometer:  Freestyle lite.    ?   ?Blood Glucose readings by meter download ? ? ?PRE-MEAL Fasting Lunch Dinner Bedtime Overall  ?Glucose range: 98-143      ?Mean/median:     119  ? ?POST-MEAL PC Breakfast PC Lunch PC Dinner   ?Glucose range:     ?Mean/median:     ? ?Prior ? ?PRE-MEAL Fasting Lunch Dinner Bedtime Overall  ?Glucose range:     96-148  ?Mean/median:       ? ? ?Dietician visit, most recent: 10/2019 ? ?Weight history: ? ?Wt Readings from Last 3 Encounters:  ?05/04/21 156 lb (70.8 kg)  ?12/31/20 153 lb (69.4 kg)  ?12/31/20 153 lb (69.4 kg)  ? ? ?Glycemic control: ?  ?Lab Results  ?Component Value Date  ? HGBA1C 7.1 (H) 04/30/2021  ? HGBA1C 6.9 (H) 12/16/2020  ? HGBA1C 7.1 (H) 07/14/2020  ? ?Lab Results  ?Component Value Date  ? MICROALBUR <0.7 04/30/2021  ? [name] 74 12/16/2020  ? CREATININE 0.91 04/30/2021  ? ?Lab Results  ?Component Value Date  ? MICRALBCREAT 1.1 04/30/2021  ? ? ?Lab Results  ?Component Value Date  ? FRUCTOSAMINE 239 10/03/2019  ? ? ?Lab on 04/30/2021  ?Component Date Value Ref Range Status  ? Microalb, Ur 04/30/2021 <0.7  0.0 - 1.9 mg/dL Final  ? Creatinine,U 04/30/2021 61.4  mg/dL Final  ? Microalb Creat Ratio 04/30/2021 1.1  0.0 - 30.0 mg/g Final  ? Sodium 04/30/2021 138  135 - 145 mEq/L Final  ?  Potassium 04/30/2021 4.2  3.5 - 5.1 mEq/L Final  ? Chloride 04/30/2021 101  96 - 112 mEq/L Final  ? CO2 04/30/2021 29  19 - 32 mEq/L Final  ? Glucose, Bld 04/30/2021 99  70 - 99 mg/dL Final  ? BUN 04/30/2021 12  6 - 23 mg/dL Final  ? Creatinine, Ser 04/30/2021 0.91  0.40 - 1.50 mg/dL Final  ? GFR 04/30/2021 85.96  >60.00 mL/min Final  ? Calculated using the CKD-EPI Creatinine Equation (2021)  ? Calcium 04/30/2021 9.3  8.4 - 10.5 mg/dL Final  ? Hgb A1c MFr Bld 04/30/2021 7.1 (H)  4.6 - 6.5 % Final  ? Glycemic Control Guidelines for People with Diabetes:Non Diabetic:  <6%Goal of Therapy: <7%Additional Action Suggested:  >8%   ? ? ?Allergies as of 05/04/2021   ? ?   Reactions  ? Actos [pioglitazone] Swelling  ? edema  ? Trandolapril Swelling  ?  w/ Angioedema  ? Azithromycin Rash  ? Other Rash  ? MAGIC MOUTHWASH  ? Tetracycline Rash  ? ?  ? ?  ?Medication List  ?  ? ?  ? Accurate as of May 04, 2021 11:08 AM. If you  have any questions, ask your nurse or doctor.  ?  ?  ? ?  ? ?acyclovir 800 MG tablet ?Commonly known as: ZOVIRAX ?Take 800 mg by mouth daily as needed (for flare-up). ?  ?aspirin 81 MG tablet ?Take 81 mg by mouth daily. ?  ?atenolol 50 MG tablet ?Commonly known as: TENORMIN ?Take 1 tablet (50 mg total) by mouth daily. ?  ?clotrimazole-betamethasone cream ?Commonly known as: LOTRISONE ?Apply 1 application topically at bedtime. To feet ?  ?freestyle lancets ?1 each by Other route daily. Use to monitor glucose levels daily; E11.9 ?  ?FREESTYLE LITE test strip ?Generic drug: glucose blood ?USE 1 STRIP DAILY TO MONITOR GLUCOSE LEVELS ?  ?Jardiance 25 MG Tabs tablet ?Generic drug: empagliflozin ?TAKE 1 TABLET DAILY ?  ?losartan 50 MG tablet ?Commonly known as: COZAAR ?  ?meclizine 12.5 MG tablet ?Commonly known as: ANTIVERT ?Take 1 tablet (12.5 mg total) by mouth 2 (two) times daily as needed for dizziness. ?  ?metFORMIN 500 MG 24 hr tablet ?Commonly known as: GLUCOPHAGE-XR ?Take 2 tablets (1,000 mg total) by mouth 2 (two) times daily. ?  ?multivitamin tablet ?Take 1 tablet by mouth daily. ?  ?olopatadine 0.1 % ophthalmic solution ?Commonly known as: PATANOL ?Apply 1 drop to eye daily as needed (for itchy and dry eyes). ?  ?rosuvastatin 10 MG tablet ?Commonly known as: CRESTOR ?TAKE 1 TABLET DAILY ?  ?Rybelsus 3 MG Tabs ?Generic drug: Semaglutide ?Take 1 tablet daily 30 minutes before breakfast with water ?  ?sildenafil 100 MG tablet ?Commonly known as: VIAGRA ?Take 100 mg by mouth daily as needed for erectile dysfunction. Use as directed ?  ?testosterone cypionate 200 MG/ML injection ?Commonly known as: DEPOTESTOSTERONE CYPIONATE ?Inject 200 mg into the muscle every 14 (fourteen) days. ?  ?triamcinolone cream 0.1 % ?Commonly known as: KENALOG ?Apply 1 application topically at bedtime. ?  ? ?  ? ? ?Allergies:  ?Allergies  ?Allergen Reactions  ? Actos [Pioglitazone] Swelling  ?  edema  ? Trandolapril Swelling  ?   w/  Angioedema  ? Azithromycin Rash  ? Other Rash  ?  MAGIC MOUTHWASH  ? Tetracycline Rash  ? ? ?Past Medical History:  ?Diagnosis Date  ? Arthritis   ? right shoulder  ? BPH (benign prostatic hyperplasia)   ?  Hyperlipidemia   ? Hypertension   ? Impotence of organic origin   ? Iron deficiency anemia   ? Mild atherosclerosis of carotid artery, bilateral   ? last doppler 12-17-2010   bilateral ICA 1-39%  ? Nocturia more than twice per night   ? Testicular hypofunction   ? Type 2 diabetes mellitus ([place])   ? endocrinologist-- dr Loanne Drilling  ? Weak urinary stream   ? Wears glasses   ? ? ?Past Surgical History:  ?Procedure Laterality Date  ? CYSTOSCOPY WITH INSERTION OF UROLIFT N/A 12/12/2017  ? Procedure: CYSTOSCOPY WITH INSERTION OF UROLIFT;  Surgeon: Festus Aloe, MD;  Location: [facility];  Service: Urology;  Laterality: N/A;  ? SHOULDER SURGERY Right 2004, 1990  ? WISDOM TOOTH EXTRACTION    ? ? ?Family History  ?Problem Relation Age of Onset  ? Heart disease Mother 59  ?     Deceased  ? Heart attack Mother   ? Hypertension Mother   ? Diabetes Father 41  ?     Deceased  ? Hypertension Father   ? Heart disease Sister   ?     heart trouble  ? Heart attack Sister   ? Diabetes Brother   ? Heart attack Brother   ? Cancer Paternal Aunt   ?     x1-Bone  ? Cancer Paternal Aunt   ?     multiple  ? Heart attack Maternal Grandmother   ? Healthy Sister   ?     x6  ? Heart attack Sister   ? Colon cancer Neg Hx   ? ? ?Social History:  reports that he has never smoked. He has never used smokeless tobacco. He reports that he does not currently use alcohol. He reports that he does not use drugs. ? ? ?Review of Systems ? ? ?Lipid history: Being treated with rosuvastatin 10 mg by PCP, lipid panel s follows ? ?  ?Lab Results  ?Component Value Date  ? CHOL 137 12/16/2020  ? HDL 49.60 12/16/2020  ? [place] 73 12/16/2020  ? LDLDIRECT 146.5 10/29/2012  ? TRIG 75.0 12/16/2020  ? CHOLHDL 3 12/16/2020  ?     ?    ?Hypertension:  Has been treated with atenolol only and followed by PCP ? ?BP Readings from Last 3 Encounters:  ?05/04/21 138/82  ?12/31/20 118/70  ?07/24/20 140/70  ? ? ?Most recent eye exam was in 2/22, has had stable background r

## 2021-05-04 NOTE — Patient Instructions (Signed)
Check blood sugars on waking up 3 days a week  Also check blood sugars about 2 hours after meals and do this after different meals by rotation  Recommended blood sugar levels on waking up are 90-130 and about 2 hours after meal is 130-160  Please bring your blood sugar monitor to each visit, thank you   

## 2021-05-07 ENCOUNTER — Ambulatory Visit (INDEPENDENT_AMBULATORY_CARE_PROVIDER_SITE_OTHER): Payer: Medicare Other | Admitting: Internal Medicine

## 2021-05-07 ENCOUNTER — Encounter: Payer: Self-pay | Admitting: Internal Medicine

## 2021-05-07 VITALS — BP 110/62 | HR 63 | Ht 66.0 in | Wt 153.0 lb

## 2021-05-07 DIAGNOSIS — I358 Other nonrheumatic aortic valve disorders: Secondary | ICD-10-CM | POA: Diagnosis not present

## 2021-05-07 DIAGNOSIS — R002 Palpitations: Secondary | ICD-10-CM | POA: Diagnosis not present

## 2021-05-07 DIAGNOSIS — E1159 Type 2 diabetes mellitus with other circulatory complications: Secondary | ICD-10-CM

## 2021-05-07 DIAGNOSIS — I152 Hypertension secondary to endocrine disorders: Secondary | ICD-10-CM

## 2021-05-07 DIAGNOSIS — E1169 Type 2 diabetes mellitus with other specified complication: Secondary | ICD-10-CM

## 2021-05-07 DIAGNOSIS — E785 Hyperlipidemia, unspecified: Secondary | ICD-10-CM

## 2021-05-07 DIAGNOSIS — I471 Supraventricular tachycardia: Secondary | ICD-10-CM

## 2021-05-07 MED ORDER — DILTIAZEM HCL 30 MG PO TABS: 30.0000 mg | ORAL_TABLET | Freq: Four times a day (QID) | ORAL | 1 refills | Status: AC | PRN

## 2021-05-07 NOTE — Progress Notes (Signed)
?Cardiology Office Note:   ? ?Date:  05/07/2021  ? ?ID:  [name], DOB 1950-02-01, MRN [id number] ? ?PCP:  Pcp, No  ?[facility] Cardiologist:  Rudean Haskell MD ?[place] Electrophysiologist:  None  ? ?CC: Chest Pain follow up ? ?History of Present Illness:   ? ?[name] is a 71 y.o. male with a hx of DM with HTN, HLD who presents for evaluation 01/16/20.   ?2022: Two ED visits and has been diagnosed with vertigo.   Had ZioPatch and Echocardiogram that were relatively benign.LDL down to 74. ? ?Patient notes that he is doing pretty good.   ?Still has rare vertigo. ?There are no interval hospital/ED visit.   ? ?Called in last month because his heart was racing.  Called and and was told to watch this.  He has only had one spell.  No syncope or near syncope.  No further symptoms. ? ?No chest pain or pressure.  No SOB/DOE and no PND/Orthopnea.  No weight gain or leg swelling.   ? ? ?Past Medical History:  ?Diagnosis Date  ? Arthritis   ? right shoulder  ? BPH (benign prostatic hyperplasia)   ? Hyperlipidemia   ? Hypertension   ? Impotence of organic origin   ? Iron deficiency anemia   ? Mild atherosclerosis of carotid artery, bilateral   ? last doppler 12-17-2010   bilateral ICA 1-39%  ? Nocturia more than twice per night   ? Testicular hypofunction   ? Type 2 diabetes mellitus ([place])   ? endocrinologist-- dr Loanne Drilling  ? Weak urinary stream   ? Wears glasses   ? ? ?Past Surgical History:  ?Procedure Laterality Date  ? CYSTOSCOPY WITH INSERTION OF UROLIFT N/A 12/12/2017  ? Procedure: CYSTOSCOPY WITH INSERTION OF UROLIFT;  Surgeon: Festus Aloe, MD;  Location: [facility];  Service: Urology;  Laterality: N/A;  ? SHOULDER SURGERY Right 2004, 1990  ? WISDOM TOOTH EXTRACTION    ? ? ?Current Medications: ?Current Meds  ?Medication Sig  ? acyclovir (ZOVIRAX) 800 MG tablet Take 800 mg by mouth daily as needed (for flare-up).  ? aspirin 81 MG tablet Take 81 mg by mouth daily.  ? atenolol  (TENORMIN) 50 MG tablet Take 1 tablet (50 mg total) by mouth daily.  ? cetirizine (ZYRTEC) 10 MG tablet Take 10 mg by mouth as needed.  ? clotrimazole-betamethasone (LOTRISONE) cream Apply 1 application topically at bedtime. To feet  ? diltiazem (CARDIZEM) 30 MG tablet Take 1 tablet (30 mg total) by mouth 4 (four) times daily as needed (elevated HR).  ? FREESTYLE LITE test strip USE 1 STRIP DAILY TO MONITOR GLUCOSE LEVELS  ? JARDIANCE 25 MG TABS tablet TAKE 1 TABLET DAILY  ? Lancets (FREESTYLE) lancets 1 each by Other route daily. Use to monitor glucose levels daily; E11.9  ? losartan (COZAAR) 50 MG tablet   ? meclizine (ANTIVERT) 12.5 MG tablet Take 1 tablet (12.5 mg total) by mouth 2 (two) times daily as needed for dizziness.  ? metFORMIN (GLUCOPHAGE-XR) 500 MG 24 hr tablet Take 2 tablets (1,000 mg total) by mouth 2 (two) times daily.  ? Multiple Vitamin (MULTIVITAMIN) tablet Take 1 tablet by mouth daily.  ? olopatadine (PATANOL) 0.1 % ophthalmic solution Apply 1 drop to eye daily as needed (for itchy and dry eyes).  ? rosuvastatin (CRESTOR) 10 MG tablet TAKE 1 TABLET DAILY  ? Semaglutide (RYBELSUS) 3 MG TABS Take 1 tablet daily 30 minutes before breakfast with water  ?  sildenafil (VIAGRA) 100 MG tablet Take 100 mg by mouth daily as needed for erectile dysfunction. Use as directed  ? tamsulosin (FLOMAX) 0.4 MG CAPS capsule Take 0.4 mg by mouth daily.  ? testosterone cypionate (DEPOTESTOSTERONE CYPIONATE) 200 MG/ML injection Inject 200 mg into the muscle every 14 (fourteen) days.  ? triamcinolone (KENALOG) 0.1 % Apply 1 application topically at bedtime.  ?  ? ?Allergies:   Actos [pioglitazone], Trandolapril, Azithromycin, Other, and Tetracycline  ? ?Social History  ? ?Socioeconomic History  ? Marital status: Married  ?  Spouse name: [name]  ? Number of children: 0  ? Years of education: Not on file  ? Highest education level: Not on file  ?Occupational History  ?  Employer: [employer]  ?Tobacco Use  ?  Smoking status: Never  ? Smokeless tobacco: Never  ?Vaping Use  ? Vaping Use: Never used  ?Substance and Sexual Activity  ? Alcohol use: Not Currently  ?  Alcohol/week: 0.0 standard drinks  ? Drug use: No  ? Sexual activity: Yes  ?Other Topics Concern  ? Not on file  ?Social History Narrative  ? Regular exercise-yes  ? Caffeine-2 cups  ? Married=wife [name] 22 years  ? Cigar Smoker-quit smoking Jan 2010  ? ?Social Determinants of Health  ? ?Financial Resource Strain: Not on file  ?Food Insecurity: Not on file  ?Transportation Needs: Not on file  ?Physical Activity: Not on file  ?Stress: Not on file  ?Social Connections: Not on file  ?  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 ?Family History: ?The patient's family history includes Cancer in his paternal aunt and paternal aunt; Diabetes in his brother; Diabetes (age of onset: 47) in his father; Healthy in his sister; Heart attack in his brother, maternal grandmother, mother, sister, and sister; Heart disease in his sister; Heart disease (age of onset: 74) in his mother; Hypertension in his father and mother. There is no history of Colon cancer. ?History of coronary artery disease notable for brother, mother, grandmother, and sisters. ?History of heart failure notable for no members. ?History of arrhythmia notable for no members. ? ?ROS:   ?Please see the history of present illness.    ?All other systems reviewed and are negative. ? ?EKGs/Labs/Other Studies Reviewed:   ? ?The following studies were reviewed today: ? ?EKG:   ?05/07/21 SR 1st HB LBBB ?01/16/20: SR 1st HB rate 69 with inferolateral TWI and LVH (secondary repolarization ) ?12/12/2017: Sinus bradycardia 1st heart block rate 46, LVH ? ?Cardiac Event Monitoring: ?Date: 02/07/20 ?Results: ?Patient had a minimum heart rate of 51 bpm, maximum heart rate of 156 bpm, and average heart rate of 74 bpm. ?Predominant underlying rhythm was sinus rhythm. ?Twelve runs of  supraventricular tachycardia occurred lasting 15 beats at longest with a max rate of 156 bpm at fastest. ?Isolated PACs were rare (<1.0%), with rare couplets and triplets present. ?Isolated PVCs were rare (<1.0%), with rare couplets and triplets present. ?Bundle Branch Block/IVCD was present. ?No triggered and diary events. ?  ?No malignant arrhythmias. ? ? ?Transthoracic Echocardiogram: ?Date: 01/22/20 ?Results: No WMAs, while the is some calcification and annular calcium, PSAX looks OK ? 1. Left ventricular ejection fraction, by estimation, is 60 to 65%. The  ?left ventricle has normal function. The left ventricle has no regional  ?wall motion abnormalities. There is mild left ventricular hypertrophy.  ?Left ventricular diastolic parameters  ?were normal.  ? 2. Right  ventricular systolic function is normal. The right ventricular  ?size is normal.  ? 3. The mitral valve is abnormal. Mild mitral valve regurgitation. No  ?evidence of mitral stenosis.  ? 4. The aortic valve is tricuspid. There is moderate calcification of the  ?aortic valve. There is moderate thickening of the aortic valve. Aortic  ?valve regurgitation is not visualized. Mild to moderate aortic valve  ?sclerosis/calcification is present,  ?without any evidence of aortic stenosis.  ? 5. The inferior vena cava is normal in size with greater than 50%  ?respiratory variability, suggesting right atrial pressure of 3 mmHg.  ? ? ?Recent Labs: ?12/16/2020: ALT 27 ?04/30/2021: BUN 12; Creatinine, Ser 0.91; Potassium 4.2; Sodium 138  ?Recent Lipid Panel ?   ?Component Value Date/Time  ? CHOL 137 12/16/2020 0741  ? TRIG 75.0 12/16/2020 0741  ? HDL 49.60 12/16/2020 0741  ? CHOLHDL 3 12/16/2020 0741  ? VLDL 15.0 12/16/2020 0741  ? [place] 73 12/16/2020 0741  ? LDLDIRECT 146.5 10/29/2012 0733  ? ? ? ?Physical Exam:   ? ?VS:  BP 110/62   Pulse 63   Ht '5\' 6"'$  (1.676 m)   Wt 153 lb (69.4 kg)   SpO2 99%   BMI 24.69 kg/m?    ? ?Wt Readings from Last 3 Encounters:   ?05/07/21 153 lb (69.4 kg)  ?05/04/21 156 lb (70.8 kg)  ?12/31/20 153 lb (69.4 kg)  ?  ?Gen: No distress  ?Neck: No JVD ?Cardiac: No Rubs or Gallops, systolic murmur, RRR +2 radial pulses ?Respiratory: Clear to a

## 2021-05-07 NOTE — Patient Instructions (Signed)
Medication Instructions:  ?Your physician has recommended you make the following change in your medication:  ?TAKE- Diltiazem 30 mg by mouth every 6 hour as needed  ? ?*If you need a refill on your cardiac medications before your next appointment, please call your pharmacy* ? ?Lab Work: ?If you have labs (blood work) drawn today and your tests are completely normal, you will receive your results only by: ?MyChart Message (if you have MyChart) OR ?A paper copy in the mail ?If you have any lab test that is abnormal or we need to change your treatment, we will call you to review the results. ? ?Testing/Procedures: ?None ordered today. ? ?Follow-Up: ?At [facility], you and your health needs are our priority.  As part of our continuing mission to provide you with exceptional heart care, we have created designated Provider Care Teams.  These Care Teams include your primary Cardiologist (physician) and Advanced Practice Providers (APPs -  Physician Assistants and Nurse Practitioners) who all work together to provide you with the care you need, when you need it. ? ?We recommend signing up for the patient portal called "MyChart".  Sign up information is provided on this After Visit Summary.  MyChart is used to connect with patients for Virtual Visits (Telemedicine).  Patients are able to view lab/test results, encounter notes, upcoming appointments, etc.  Non-urgent messages can be sent to your provider as well.   ?To learn more about what you can do with MyChart, go to [email or web address].   ? ?Your next appointment:   ?1 year(s) ? ?The format for your next appointment:   ?In Person ? ?Provider:   ?Werner Lean, MD { ? ? ?Important Information About Sugar ? ? ? ? ?  ?

## 2021-05-20 ENCOUNTER — Ambulatory Visit: Payer: Medicare Other | Admitting: Internal Medicine

## 2021-05-26 ENCOUNTER — Other Ambulatory Visit: Payer: Self-pay | Admitting: Endocrinology

## 2021-06-23 ENCOUNTER — Other Ambulatory Visit: Payer: Self-pay | Admitting: Endocrinology

## 2021-07-10 ENCOUNTER — Encounter (HOSPITAL_COMMUNITY): Payer: Self-pay

## 2021-07-10 ENCOUNTER — Emergency Department (HOSPITAL_COMMUNITY)
Admission: EM | Admit: 2021-07-10 | Discharge: 2021-07-10 | Disposition: A | Discharge status: Home / Self Care | Payer: Medicare Other | Attending: Emergency Medicine | Admitting: Emergency Medicine

## 2021-07-10 ENCOUNTER — Other Ambulatory Visit: Payer: Self-pay

## 2021-07-10 DIAGNOSIS — T83091A Other mechanical complication of indwelling urethral catheter, initial encounter: Secondary | ICD-10-CM | POA: Insufficient documentation

## 2021-07-10 DIAGNOSIS — Z7982 Long term (current) use of aspirin: Secondary | ICD-10-CM | POA: Diagnosis not present

## 2021-07-10 DIAGNOSIS — Y732 Prosthetic and other implants, materials and accessory gastroenterology and urology devices associated with adverse incidents: Secondary | ICD-10-CM | POA: Diagnosis not present

## 2021-07-10 DIAGNOSIS — E119 Type 2 diabetes mellitus without complications: Secondary | ICD-10-CM | POA: Insufficient documentation

## 2021-07-10 DIAGNOSIS — I10 Essential (primary) hypertension: Secondary | ICD-10-CM | POA: Insufficient documentation

## 2021-07-10 DIAGNOSIS — Z79899 Other long term (current) drug therapy: Secondary | ICD-10-CM | POA: Insufficient documentation

## 2021-07-10 DIAGNOSIS — T839XXA Unspecified complication of genitourinary prosthetic device, implant and graft, initial encounter: Secondary | ICD-10-CM

## 2021-07-10 LAB — URINALYSIS, ROUTINE W REFLEX MICROSCOPIC
Bilirubin Urine: NEGATIVE
Glucose, UA: >=500 mg/dL — AB
Ketones, ur: NEGATIVE mg/dL
Nitrite: NEGATIVE
Protein, ur: 30 mg/dL — AB
Specific Gravity, Urine: 1.02 (ref 1.005–1.030)
WBC, UA: >50 WBC/hpf — ABNORMAL HIGH (ref 0–5)
pH: 5 (ref 5.0–8.0)

## 2021-07-10 NOTE — Discharge Instructions (Signed)
Continue your antibiotics as prescribed and follow-up with urology as scheduled.  Return to the emergency department at any time for any further issues of concern.

## 2021-07-10 NOTE — ED Provider Notes (Signed)
[facility] DEPT Provider Note   CSN: [id number] Arrival date & time: 07/10/21  5284     History  Chief Complaint  Patient presents with   Catheter Issues    [name] is a 71 y.o. male.  HPI Patient presents for Foley catheter issue.  Medical history includes DM, HLD, HTN, retinopathy, and BPH.  He underwent a procedure to his prostate approximately 6 weeks ago.  He had a Foley in place after that but only for about 5 days.  He has had good spontaneous voiding up until earlier this week.  He experienced some weak stream on Monday.  He was prescribed cefuroxime.  He was seen again on Wednesday due to worsening ability to urinate and antibiotic was switched to Bactrim.  He had a Foley catheter placed at that time.  Foley catheter was draining well up until last night.  He last drained his Foley bag at around 11 PM.  Today, he has had no output into his Foley bag.  When he does bear down, he will have leakage of urine from around the Foley catheter.  He has some mild discomfort with this as well.  Patient denies any other symptoms, including any systemic symptoms.     Home Medications Prior to Admission medications   Medication Sig Start Date End Date Taking? Authorizing Provider  acyclovir (ZOVIRAX) 800 MG tablet Take 800 mg by mouth daily as needed (for flare-up). 02/15/13   [provider]  aspirin 81 MG tablet Take 81 mg by mouth daily.    [provider]  atenolol (TENORMIN) 50 MG tablet Take 1 tablet (50 mg total) by mouth daily. 05/18/20   Shelda Pal, DO  cetirizine (ZYRTEC) 10 MG tablet Take 10 mg by mouth as needed. 03/08/21   [provider]  clotrimazole-betamethasone (LOTRISONE) cream Apply 1 application topically at bedtime. To feet 05/14/19   Wendling, [name], DO  diltiazem (CARDIZEM) 30 MG tablet Take 1 tablet (30 mg total) by mouth 4 (four) times daily as needed (elevated HR). 05/07/21    Chandrasekhar, Mahesh A, MD  FREESTYLE LITE test strip USE 1 STRIP DAILY TO MONITOR GLUCOSE LEVELS 03/30/21   Renato Shin, MD  JARDIANCE 25 MG TABS tablet TAKE 1 TABLET DAILY 06/29/20   Elayne Snare, MD  Lancets (FREESTYLE) lancets 1 each by Other route daily. Use to monitor glucose levels daily; E11.9 09/19/18   Renato Shin, MD  losartan (COZAAR) 50 MG tablet  12/14/20   [provider]  meclizine (ANTIVERT) 12.5 MG tablet Take 1 tablet (12.5 mg total) by mouth 2 (two) times daily as needed for dizziness. 02/26/20   Rayna Sexton, PA-C  metFORMIN (GLUCOPHAGE-XR) 500 MG 24 hr tablet TAKE 2 TABLETS TWICE A DAY 05/26/21   Elayne Snare, MD  Multiple Vitamin (MULTIVITAMIN) tablet Take 1 tablet by mouth daily.    [provider]  olopatadine (PATANOL) 0.1 % ophthalmic solution Apply 1 drop to eye daily as needed (for itchy and dry eyes).    [provider]  rosuvastatin (CRESTOR) 10 MG tablet TAKE 1 TABLET DAILY 06/29/20   Nani Ravens, [name], DO  RYBELSUS 3 MG TABS TAKE 1 TABLET DAILY [place] WITH WATER 06/24/21   Elayne Snare, MD  sildenafil (VIAGRA) 100 MG tablet Take 100 mg by mouth daily as needed for erectile dysfunction. Use as directed    [provider]  tamsulosin (FLOMAX) 0.4 MG CAPS capsule Take 0.4 mg by mouth  daily. 04/19/21   [provider]  testosterone cypionate (DEPOTESTOSTERONE CYPIONATE) 200 MG/ML injection Inject 200 mg into the muscle every 14 (fourteen) days. 11/19/19   [provider]  triamcinolone (KENALOG) 0.1 % Apply 1 application topically at bedtime. 12/06/19   [provider]      Allergies    Actos [pioglitazone], Trandolapril, Azithromycin, Other, and Tetracycline    Review of Systems   Review of Systems  Genitourinary:  Positive for difficulty urinating and dysuria.  All other systems reviewed and are negative.   Physical Exam Updated Vital Signs BP 128/71   Pulse 70   Temp  98.1 F (36.7 C) (Oral)   Resp 16   SpO2 100%  Physical Exam Vitals and nursing note reviewed.  Constitutional:      General: He is not in acute distress.    Appearance: Normal appearance. He is well-developed and normal weight. He is not ill-appearing, toxic-appearing or diaphoretic.  HENT:     Head: Normocephalic and atraumatic.     Right Ear: External ear normal.     Left Ear: External ear normal.     Nose: Nose normal.     Mouth/Throat:     Mouth: Mucous membranes are moist.     Pharynx: Oropharynx is clear.  Eyes:     Extraocular Movements: Extraocular movements intact.     Conjunctiva/sclera: Conjunctivae normal.  Cardiovascular:     Rate and Rhythm: Normal rate and regular rhythm.     Heart sounds: No murmur heard. Pulmonary:     Effort: Pulmonary effort is normal. No respiratory distress.  Abdominal:     General: There is no distension.     Palpations: Abdomen is soft.     Tenderness: There is no abdominal tenderness.  Genitourinary:    Comments: Foley catheter in place.  No suprapubic distention or tenderness.  On bedside ultrasound, approximately 450 cc of urine in bladder. Musculoskeletal:        General: No swelling. Normal range of motion.     Cervical back: Normal range of motion and neck supple.     Right lower leg: No edema.     Left lower leg: No edema.  Skin:    General: Skin is warm and dry.     Coloration: Skin is not jaundiced or pale.  Neurological:     General: No focal deficit present.     Mental Status: He is alert and oriented to person, place, and time.     Cranial Nerves: No cranial nerve deficit.     Sensory: No sensory deficit.     Motor: No weakness.     Coordination: Coordination normal.  Psychiatric:        Mood and Affect: Mood normal.     ED Results / Procedures / Treatments   Labs (all labs ordered are listed, but only abnormal results are displayed) Labs Reviewed  URINALYSIS, ROUTINE W REFLEX MICROSCOPIC - Abnormal; Notable  for the following components:      Result Value   APPearance HAZY (*)    Glucose, UA >=500 (*)    Hgb urine dipstick LARGE (*)    Protein, ur 30 (*)    Leukocytes,Ua LARGE (*)    WBC, UA >50 (*)    Bacteria, UA RARE (*)    All other components within normal limits  URINE CULTURE    EKG None  Radiology No results found.  Procedures Procedures    Medications Ordered in ED Medications - No  data to display  ED Course/ Medical Decision Making/ A&P                           Medical Decision Making  Patient presents for urinary retention.  He is followed by urology and did have a Foley catheter placed 3 days ago.  He has been on antibiotics for the past 5 days for UTI.  His Foley catheter was draining well up until last night.  He has not had any urinary output into Foley bag in the past 12 hours.  Patient is well-appearing on arrival in the ED.  He has no complaints of physical discomfort at this time.  He is currently on Bactrim for UTI.  His vital signs are normal.  He does not have any lower abdominal distention.  I did utilize bedside ultrasound to assess for bladder volume.  Patient is approximately 450 cc of bladder volume despite an empty Foley catheter bag.  Foley was flushed with normal saline.  Following this, he had appropriate urine output.  Patient was advised to continue antibiotics as prescribed and to follow-up with urology.  He was discharged in good condition.        Final Clinical Impression(s) / ED Diagnoses Final diagnoses:  Problem with Foley catheter, initial encounter [facility])    Rx / DC Orders ED Discharge Orders     None         Godfrey Pick, MD 07/10/21 1322

## 2021-07-10 NOTE — ED Provider Triage Note (Signed)
Emergency Medicine Provider Triage Evaluation Note  [name] , a 71 y.o. male  was evaluated in triage.  Pt complains of urinary catheter issue. States that he had a prostatectomy in may and had a catheter for some time after that but was able to have it removed and pass a void trial. However, states that Thursday he once again had difficulty voiding and therefore a catheter was placed again for management. States that last night he noticed urine dripping around the catheter and today urine is coming out almost exclusively around the catheter. States that urinating is painful. He is currently being treated for UTI. Denies fevers, chills, abdominal pain  Review of Systems  Positive:  Negative:   Physical Exam  BP (!) 146/82 (BP Location: Left Arm)   Pulse 68   Temp 98 F (36.7 C) (Oral)   Resp 18   SpO2 100%  Gen:   Awake, no distress   Resp:  Normal effort  MSK:   Moves extremities without difficulty  Other:    Medical Decision Making  Medically screening exam initiated at 8:52 AM.  Appropriate orders placed.  [name] was informed that the remainder of the evaluation will be completed by another provider, this initial triage assessment does not replace that evaluation, and the importance of remaining in the ED until their evaluation is complete.     Bud Face, PA-C 07/10/21 [phone]

## 2021-07-10 NOTE — ED Triage Notes (Signed)
Pt states his foley catheter was placed 2 days ago and last night pt noticed a decrease in urine in the bag and is c/o urine coming out of his penis instead of flowing in the bag. Pt c/o painful urination out of his penis.

## 2021-07-11 LAB — URINE CULTURE: Culture: NO GROWTH

## 2021-07-14 IMAGING — MR MR HEAD W/O CM
6 of 10 series · 27 of 48 positions shown · non-contrast
Comparison: None.

CLINICAL DATA: Intermittent dizziness

EXAM:
MRI HEAD WITHOUT CONTRAST
TECHNIQUE: Multiplanar, multiecho pulse sequences of the brain and surrounding
structures were obtained without intravenous contrast.

[Series 2: DWI · axial · 3.0mm · 0.94mm/px · z∈[-75,+67]mm · 8 of 100 slices shown (1 of 2)]
[im 1/100]
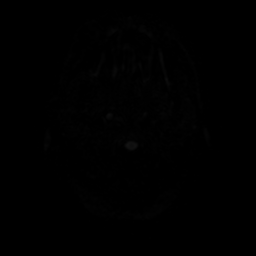
[im 12/100]
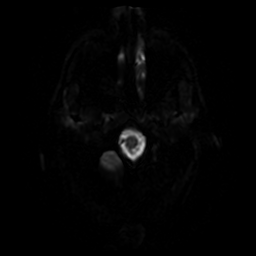
[im 34/100]
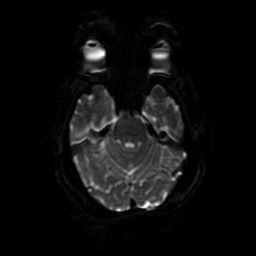
[im 45/100]
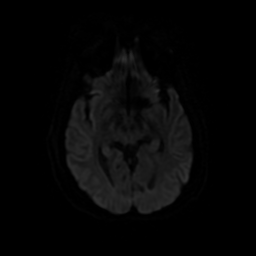
[im 56/100]
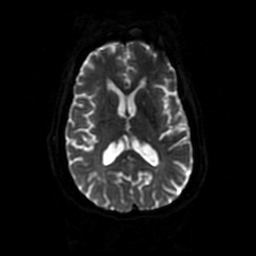
[im 67/100]
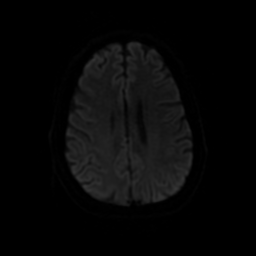
[im 89/100]
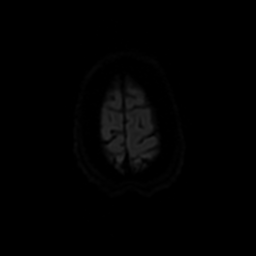
[im 100/100]
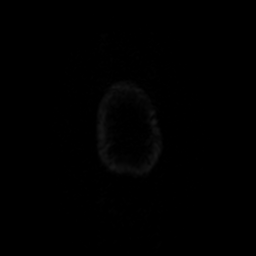

[Series 3: DWI · coronal · 4.0mm · 0.94mm/px · 7 of 74 slices shown (2 of 2)]
[im 1/74]
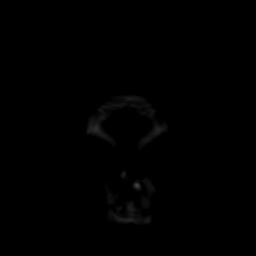
[im 13/74]
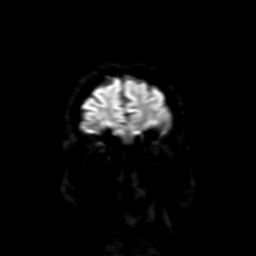
[im 25/74]
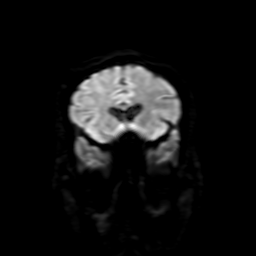
[im 37/74]
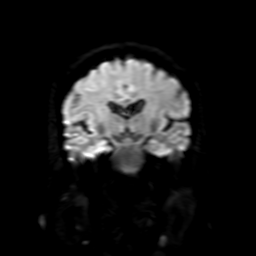
[im 49/74]
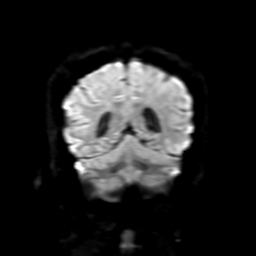
[im 61/74]
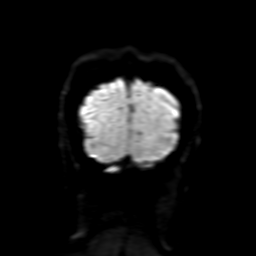
[im 74/74]
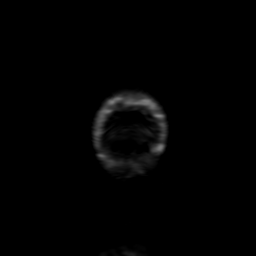

[Series 4: FLAIR · sagittal · 5.0mm · 0.23mm/px · 2 of 23 slices shown (1 of 2)]
[im 1/23]
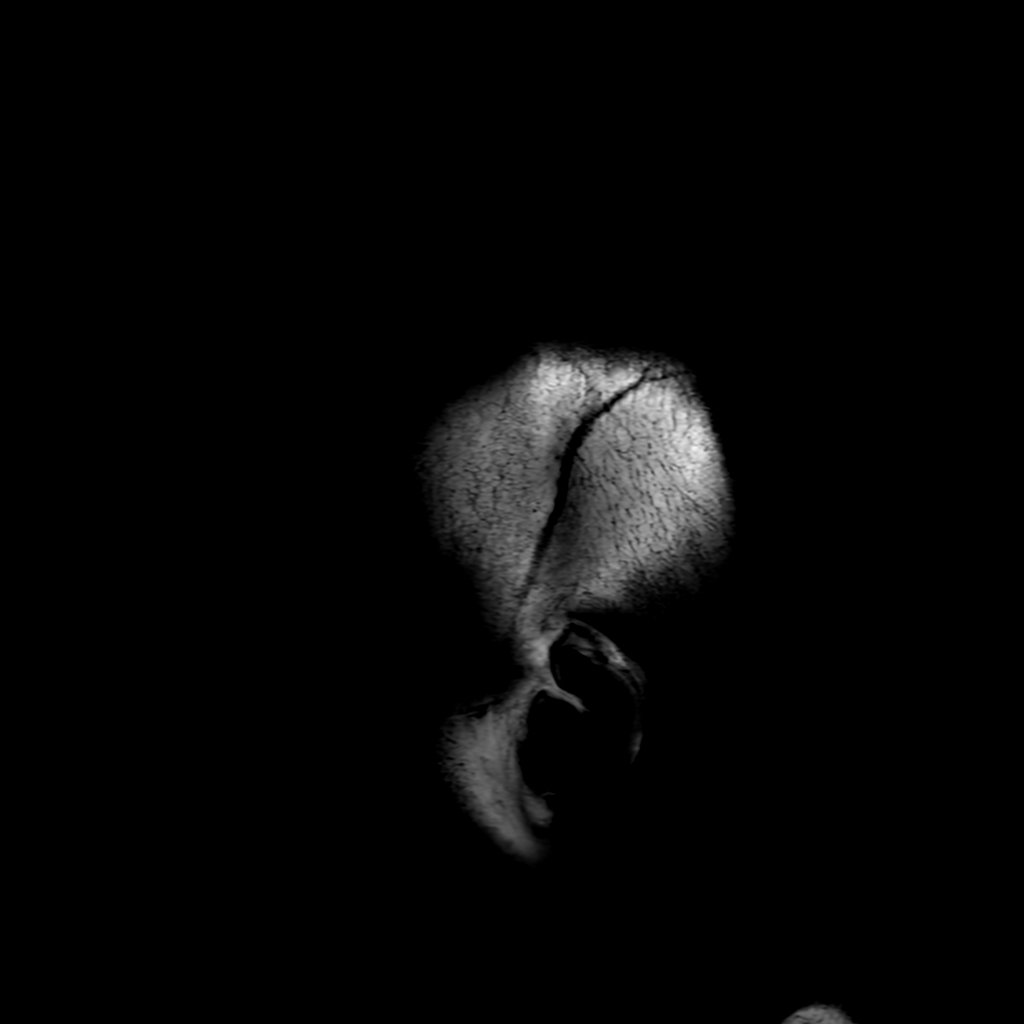
[im 23/23]
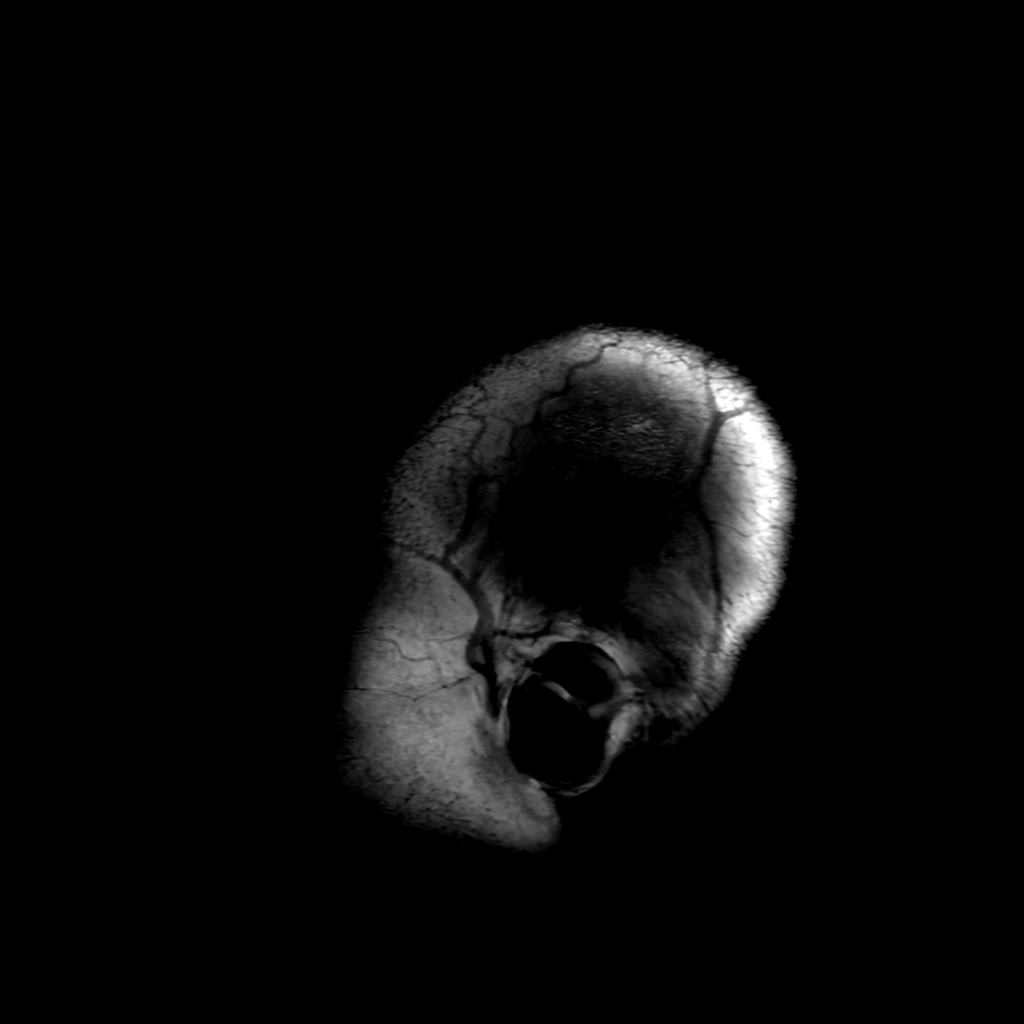

[Series 6: FLAIR · axial · 3.0mm · 0.45mm/px · z∈[-73,+66]mm · 2 of 25 slices shown (2 of 2)]
[im 1/25]
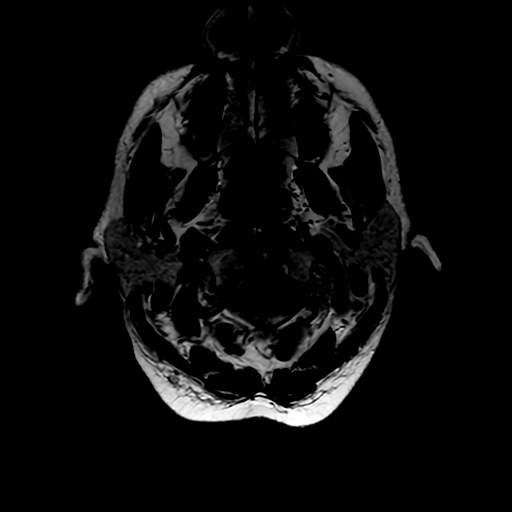
[im 25/25]
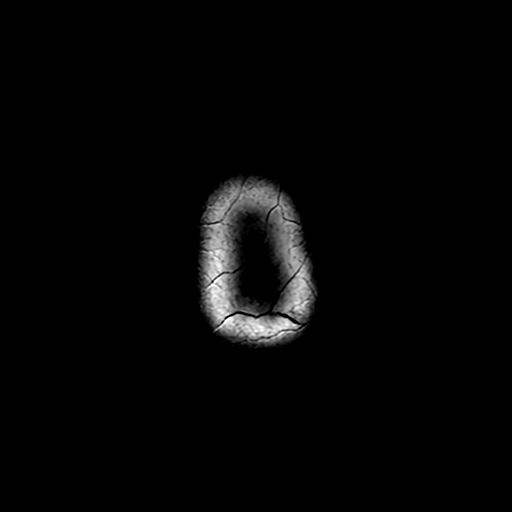

[Series 250: ADC · axial · 3.0mm · 0.94mm/px · z∈[-75,+67]mm · 5 of 50 slices shown (1 of 2)]
[im 1/50]
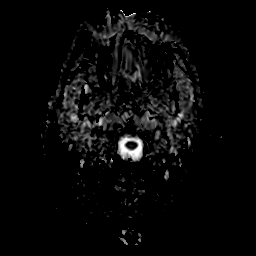
[im 13/50]
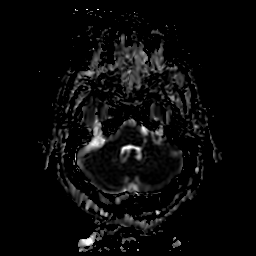
[im 25/50]
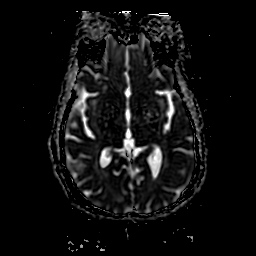
[im 37/50]
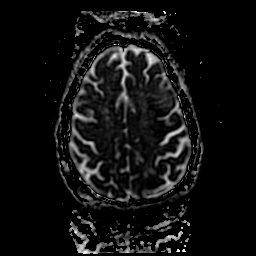
[im 50/50]
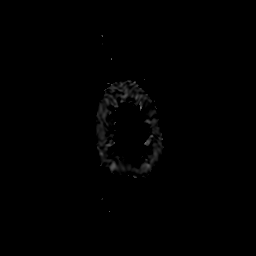

[Series 350: ADC · coronal · 4.0mm · 0.94mm/px · 3 of 37 slices shown (2 of 2)]
[im 1/37]
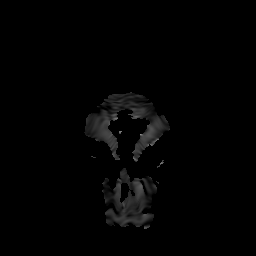
[im 19/37]
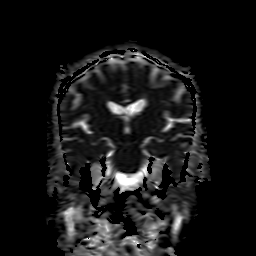
[im 37/37]
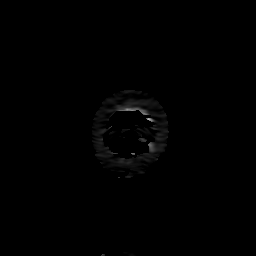

[27 of 48 positions shown; findings below may reference images not displayed]

FINDINGS: Brain: There is no acute infarction or intracranial hemorrhage.
There is no intracranial mass, mass effect, or edema. There is no
hydrocephalus or extra-axial fluid collection. Ventricles and sulci
are within normal limits in size and configuration. Patchy T2
hyperintensity in the supratentorial white matter is nonspecific
probably reflects mild to moderate chronic microvascular ischemic
changes.

Vascular: 9 mm T2 hypointense structure in the region of the
anterior communicating artery (series 5, image 11). Poor
visualization intracranial left vertebral artery flow void. This may
be related to congenitally small size or proximal stenosis.

Skull and upper cervical spine: Normal marrow signal is preserved.

Sinuses/Orbits: Paranasal sinuses are aerated. Orbits are
unremarkable.

Other: Sella is unremarkable.  Mastoid air cells are clear.
IMPRESSION: No evidence of recent infarction, hemorrhage, or mass.

Mild to moderate chronic microvascular ischemic changes.

Possible aneurysm in the region of the anterior communicating
artery. Poor visualization of intracranial left vertebral artery may
be related to congenitally small size or proximal stenosis. MRA/CTA
recommended for further evaluation.

## 2021-08-04 ENCOUNTER — Ambulatory Visit: Payer: Medicare Other | Admitting: Internal Medicine

## 2021-09-13 ENCOUNTER — Other Ambulatory Visit (INDEPENDENT_AMBULATORY_CARE_PROVIDER_SITE_OTHER): Payer: Medicare Other

## 2021-09-13 DIAGNOSIS — E1165 Type 2 diabetes mellitus with hyperglycemia: Secondary | ICD-10-CM

## 2021-09-13 LAB — BASIC METABOLIC PANEL
BUN: 16 mg/dL (ref 6–23)
CO2: 27 mEq/L (ref 19–32)
Calcium: 9.1 mg/dL (ref 8.4–10.5)
Chloride: 100 mEq/L (ref 96–112)
Creatinine, Ser: 1 mg/dL (ref 0.40–1.50)
GFR: 76.56 mL/min (ref >60.00)
Glucose, Bld: 98 mg/dL (ref 70–99)
Potassium: 4.2 mEq/L (ref 3.5–5.1)
Sodium: 135 mEq/L (ref 135–145)

## 2021-09-13 LAB — HEMOGLOBIN A1C: Hgb A1c MFr Bld: 7.5 % — ABNORMAL HIGH (ref 4.6–6.5)

## 2021-09-14 NOTE — Progress Notes (Unsigned)
Patient ID: [name], male   DOB: [birth year], 71 y.o.   MRN: [id number]           Reason for Appointment: Follow-up for Type 2 Diabetes   History of Present Illness:          Date of diagnosis of type 2 diabetes mellitus:    1996     Background history:  Initially was on Metformin and possibly other medication but details are not available His A1c since 2016 has ranged between 6 and 8.1, mostly in the low 7 range He was started on Invokana in 2017 and also was given acarbose and Prandin in 2018 in addition He has also been prescribed bromocriptine to take at bedtime  Recent history:   A1c is 7.5  Previous fructosamine is 239  Non-insulin hypoglycemic drugs currently: Rybelsus 3 mg daily, Jardiance 25 mg, Metformin 2 g  Current management, blood sugar patterns and problems identified: He appears to have progressively increasing A1c Again has only checked blood sugars in the mornings despite reminders  Also did not bring his meter today  He does not think he is going off his diet or eating more carbohydrates than usual His weight has gone down about 5 pounds since his last visit Currently not doing any formal exercise except occasional stationary bike Lab glucose 98 fasting       Side effects from medications have been: None  Typical meal intake: Breakfast is oatmeal with eggs   Lunch usually fruit.  Dinner is usually vegetables, usually avoiding fried food and carbohydrates like rice and pasta               Glucose monitoring:  done 1 times a day         Glucometer:  Freestyle lite.       Blood Glucose readings by recall   PRE-MEAL Fasting Lunch Dinner Bedtime Overall  Glucose range: 115-150      Mean/median:        POST-MEAL PC Breakfast PC Lunch PC Dinner  Glucose range: 160    Mean/median:      Prior    PRE-MEAL Fasting Lunch Dinner Bedtime Overall  Glucose range: 98-143      Mean/median:     119   Dietician visit, most recent: 10/2019  Weight  history:  Wt Readings from Last 3 Encounters:  09/15/21 151 lb 12.8 oz (68.9 kg)  05/07/21 153 lb (69.4 kg)  05/04/21 156 lb (70.8 kg)    Glycemic control:   Lab Results  Component Value Date   HGBA1C 7.5 (H) 09/13/2021   HGBA1C 7.1 (H) 04/30/2021   HGBA1C 6.9 (H) 12/16/2020   Lab Results  Component Value Date   MICROALBUR <0.7 04/30/2021   LDLCALC 73 12/16/2020   CREATININE 1.00 09/13/2021   Lab Results  Component Value Date   MICRALBCREAT 1.1 04/30/2021    Lab Results  Component Value Date   FRUCTOSAMINE 239 10/03/2019    Lab on 09/13/2021  Component Date Value Ref Range Status   Sodium 09/13/2021 135  135 - 145 mEq/L Final   Potassium 09/13/2021 4.2  3.5 - 5.1 mEq/L Final   Chloride 09/13/2021 100  96 - 112 mEq/L Final   CO2 09/13/2021 27  19 - 32 mEq/L Final   Glucose, Bld 09/13/2021 98  70 - 99 mg/dL Final   BUN 09/13/2021 16  6 - 23 mg/dL Final   Creatinine, Ser 09/13/2021 1.00  0.40 - 1.50 mg/dL Final  GFR 09/13/2021 76.56  >60.00 mL/min Final   Calculated using the CKD-EPI Creatinine Equation (2021)   Calcium 09/13/2021 9.1  8.4 - 10.5 mg/dL Final   Hgb A1c MFr Bld 09/13/2021 7.5 (H)  4.6 - 6.5 % Final   Glycemic Control Guidelines for People with Diabetes:Non Diabetic:  <6%Goal of Therapy: <7%Additional Action Suggested:  >8%     Allergies as of 09/15/2021       Reactions   Actos [pioglitazone] Swelling   edema   Trandolapril Swelling    w/ Angioedema   Azithromycin Rash   Other Rash   MAGIC MOUTHWASH   Tetracycline Rash        Medication List        Accurate as of September 15, 2021  8:15 AM. If you have any questions, ask your nurse or doctor.          acyclovir 800 MG tablet Commonly known as: ZOVIRAX Take 800 mg by mouth daily as needed (for flare-up).   aspirin 81 MG tablet Take 81 mg by mouth daily.   atenolol 50 MG tablet Commonly known as: TENORMIN Take 1 tablet (50 mg total) by mouth daily.   cetirizine 10 MG  tablet Commonly known as: ZYRTEC Take 10 mg by mouth as needed.   clotrimazole-betamethasone cream Commonly known as: LOTRISONE Apply 1 application topically at bedtime. To feet   diltiazem 30 MG tablet Commonly known as: Cardizem Take 1 tablet (30 mg total) by mouth 4 (four) times daily as needed (elevated HR).   freestyle lancets 1 each by Other route daily. Use to monitor glucose levels daily; E11.9   FREESTYLE LITE test strip Generic drug: glucose blood USE 1 STRIP DAILY TO MONITOR GLUCOSE LEVELS   Jardiance 25 MG Tabs tablet Generic drug: empagliflozin TAKE 1 TABLET DAILY   losartan 50 MG tablet Commonly known as: COZAAR   meclizine 12.5 MG tablet Commonly known as: ANTIVERT Take 1 tablet (12.5 mg total) by mouth 2 (two) times daily as needed for dizziness.   metFORMIN 500 MG 24 hr tablet Commonly known as: GLUCOPHAGE-XR TAKE 2 TABLETS TWICE A DAY   multivitamin tablet Take 1 tablet by mouth daily.   olopatadine 0.1 % ophthalmic solution Commonly known as: PATANOL Apply 1 drop to eye daily as needed (for itchy and dry eyes).   rosuvastatin 10 MG tablet Commonly known as: CRESTOR TAKE 1 TABLET DAILY   Rybelsus 3 MG Tabs Generic drug: Semaglutide TAKE 1 TABLET DAILY 30 MINUTES BEFORE BREAKFAST WITH WATER   sildenafil 100 MG tablet Commonly known as: VIAGRA Take 100 mg by mouth daily as needed for erectile dysfunction. Use as directed   tamsulosin 0.4 MG Caps capsule Commonly known as: FLOMAX Take 0.4 mg by mouth daily.   testosterone cypionate 200 MG/ML injection Commonly known as: DEPOTESTOSTERONE CYPIONATE Inject 200 mg into the muscle every 14 (fourteen) days.   triamcinolone cream 0.1 % Commonly known as: KENALOG Apply 1 application topically at bedtime.        Allergies:  Allergies  Allergen Reactions   Actos [Pioglitazone] Swelling    edema   Trandolapril Swelling     w/ Angioedema   Azithromycin Rash   Other Rash    MAGIC  MOUTHWASH   Tetracycline Rash    Past Medical History:  Diagnosis Date   Arthritis    right shoulder   BPH (benign prostatic hyperplasia)    Hyperlipidemia    Hypertension    Impotence of organic origin  Iron deficiency anemia    Mild atherosclerosis of carotid artery, bilateral    last doppler 12-17-2010   bilateral ICA 1-39%   Nocturia more than twice per night    Testicular hypofunction    Type 2 diabetes mellitus [facility])    endocrinologist-- dr Georgiana Shore urinary stream    Wears glasses     Past Surgical History:  Procedure Laterality Date   CYSTOSCOPY WITH INSERTION OF UROLIFT N/A 12/12/2017   Procedure: CYSTOSCOPY WITH INSERTION OF UROLIFT;  Surgeon: Festus Aloe, MD;  Location: [facility] - Inpatient;  Service: Urology;  Laterality: N/A;   SHOULDER SURGERY Right 2004, 1990   WISDOM TOOTH EXTRACTION      Family History  Problem Relation Age of Onset   Heart disease Mother 51       Deceased   Heart attack Mother    Hypertension Mother    Diabetes Father 49       Deceased   Hypertension Father    Heart disease Sister        heart trouble   Heart attack Sister    Diabetes Brother    Heart attack Brother    Cancer Paternal Aunt        x1-Bone   Cancer Paternal Aunt        multiple   Heart attack Maternal Grandmother    Healthy Sister        x6   Heart attack Sister    Colon cancer Neg Hx     Social History:  reports that he has never smoked. He has never used smokeless tobacco. He reports that he does not currently use alcohol. He reports that he does not use drugs.   Review of Systems   Lipid history: Being treated with rosuvastatin 10 mg by PCP, lipid panel s follows    Lab Results  Component Value Date   CHOL 137 12/16/2020   HDL 49.60 12/16/2020   LDLCALC 73 12/16/2020   LDLDIRECT 146.5 10/29/2012   TRIG 75.0 12/16/2020   CHOLHDL 3 12/16/2020           Hypertension: This is mild, has been treated with atenolol only and  followed by PCP  BP Readings from Last 3 Encounters:  09/15/21 118/68  07/10/21 128/71  05/07/21 110/62    Most recent eye exam was in 2/22, has had stable background retinopathy  Most recent foot exam: 12/22  Currently known complications of diabetes: Background retinopathy  LABS:  Lab on 09/13/2021  Component Date Value Ref Range Status   Sodium 09/13/2021 135  135 - 145 mEq/L Final   Potassium 09/13/2021 4.2  3.5 - 5.1 mEq/L Final   Chloride 09/13/2021 100  96 - 112 mEq/L Final   CO2 09/13/2021 27  19 - 32 mEq/L Final   Glucose, Bld 09/13/2021 98  70 - 99 mg/dL Final   BUN 09/13/2021 16  6 - 23 mg/dL Final   Creatinine, Ser 09/13/2021 1.00  0.40 - 1.50 mg/dL Final   GFR 09/13/2021 76.56  >60.00 mL/min Final   Calculated using the CKD-EPI Creatinine Equation (2021)   Calcium 09/13/2021 9.1  8.4 - 10.5 mg/dL Final   Hgb A1c MFr Bld 09/13/2021 7.5 (H)  4.6 - 6.5 % Final   Glycemic Control Guidelines for People with Diabetes:Non Diabetic:  <6%Goal of Therapy: <7%Additional Action Suggested:  >8%     Physical Examination:  BP 118/68   Pulse 62   Ht 5' 6.5" (1.689 m)  Wt 151 lb 12.8 oz (68.9 kg)   SpO2 98%   BMI 24.13 kg/m      ASSESSMENT:  Diabetes type 2 non-insulin-dependent  See history of present illness for detailed discussion of current diabetes management, blood sugar patterns and problems identified  He is on a regimen of 3 mg Rybelsus, Metformin and Jardiance 25 mg  Again his A1c is slightly higher Likely blood sugars are more consistently high after meals which she does not monitor despite repeated reminders Also A1c not improving despite is trying to do better with remembering his metformin in the evening Currently losing weight, this may be partly from use of Jardiance  Hypertension: Blood pressure well controlled  LIPIDS: Currently monitored by PCP  PLAN:     Trial of Actos 15 mg daily Although he has swelling with this before since he is  already on Jardiance and losartan hopefully will not have any edema Otherwise may be a candidate for Cycloset which she likely may not be able to afford Other options are very limited also except increasing Rybelsus which may cause further weight loss and this was explained  Discussed need to check blood sugars after meals consistently which she is not doing to help identify how often he is having hyperglycemia and with what kind of foods  Advised him to start going to the exercise facility or start using his exercise bike on a daily basis Start checking blood sugars about 2 hours after meals and discussed blood sugar targets Follow-up in 3 months   There are no Patient Instructions on file for this visit.    Elayne Snare 09/15/2021, 8:15 AM   Note: This office note was prepared with Dragon voice recognition system technology. Any transcriptional errors that result from this process are unintentional.

## 2021-09-15 ENCOUNTER — Ambulatory Visit (INDEPENDENT_AMBULATORY_CARE_PROVIDER_SITE_OTHER): Payer: Medicare Other | Admitting: Endocrinology

## 2021-09-15 ENCOUNTER — Encounter: Payer: Self-pay | Admitting: Endocrinology

## 2021-09-15 VITALS — BP 118/68 | HR 62 | Ht 66.5 in | Wt 151.8 lb

## 2021-09-15 DIAGNOSIS — E1165 Type 2 diabetes mellitus with hyperglycemia: Secondary | ICD-10-CM

## 2021-09-15 MED ORDER — PIOGLITAZONE HCL 15 MG PO TABS: 30.0000 mg | ORAL_TABLET | Freq: Every day | ORAL | 1 refills | Status: DC

## 2021-09-15 NOTE — Patient Instructions (Addendum)
Check blood sugars on waking up 3 days a week  Also check blood sugars about 2 hours after meals and do this after different meals by rotation  Recommended blood sugar levels on waking up are 90-130 and about 2 hours after meal is 130-160  Please bring your blood sugar monitor to each visit, thank you  Restart exercise

## 2021-09-19 ENCOUNTER — Emergency Department (HOSPITAL_BASED_OUTPATIENT_CLINIC_OR_DEPARTMENT_OTHER)
Admission: EM | Admit: 2021-09-19 | Discharge: 2021-09-19 | Disposition: A | Discharge status: Home / Self Care | Payer: Medicare Other | Attending: Emergency Medicine | Admitting: Emergency Medicine

## 2021-09-19 ENCOUNTER — Encounter (HOSPITAL_BASED_OUTPATIENT_CLINIC_OR_DEPARTMENT_OTHER): Payer: Self-pay

## 2021-09-19 ENCOUNTER — Other Ambulatory Visit: Payer: Self-pay

## 2021-09-19 ENCOUNTER — Emergency Department (HOSPITAL_BASED_OUTPATIENT_CLINIC_OR_DEPARTMENT_OTHER): Payer: Medicare Other

## 2021-09-19 DIAGNOSIS — Z79899 Other long term (current) drug therapy: Secondary | ICD-10-CM | POA: Diagnosis not present

## 2021-09-19 DIAGNOSIS — J069 Acute upper respiratory infection, unspecified: Secondary | ICD-10-CM | POA: Insufficient documentation

## 2021-09-19 DIAGNOSIS — Z7984 Long term (current) use of oral hypoglycemic drugs: Secondary | ICD-10-CM | POA: Diagnosis not present

## 2021-09-19 DIAGNOSIS — Z20822 Contact with and (suspected) exposure to covid-19: Secondary | ICD-10-CM | POA: Insufficient documentation

## 2021-09-19 DIAGNOSIS — I10 Essential (primary) hypertension: Secondary | ICD-10-CM | POA: Diagnosis not present

## 2021-09-19 DIAGNOSIS — Z7982 Long term (current) use of aspirin: Secondary | ICD-10-CM | POA: Diagnosis not present

## 2021-09-19 DIAGNOSIS — E119 Type 2 diabetes mellitus without complications: Secondary | ICD-10-CM | POA: Diagnosis not present

## 2021-09-19 DIAGNOSIS — R0981 Nasal congestion: Secondary | ICD-10-CM | POA: Diagnosis present

## 2021-09-19 LAB — SARS CORONAVIRUS 2 BY RT PCR: SARS Coronavirus 2 by RT PCR: NEGATIVE

## 2021-09-19 NOTE — Discharge Instructions (Addendum)
Your COVID-19 PCR testing was negative.  Your chest x-ray was negative for evidence of bacterial pneumonia.  Your symptoms are consistent with likely viral URI.  Recommend symptomatic management with over-the-counter Tylenol and ibuprofen for pain and muscle aches.  Return to the emergency department in the event of any worsening of your symptoms.  Continue to push oral fluid resuscitation to remain hydrated.

## 2021-09-19 NOTE — ED Provider Notes (Signed)
[facility] EMERGENCY DEPT Provider Note   CSN: [id number] Arrival date & time: 09/19/21  0841     History  Chief Complaint  Patient presents with   Cough   Sore Throat   Nasal Congestion    [name] is a 71 y.o. male.   Cough Sore Throat     71 year old male with medical history significant for HTN, DM 2, mild carotid atherosclerosis, HLD, BPH who presents to the emergency department with nasal congestion, chest congestion with a mild productive cough, sore throat for the past 3 days.  He has been taking Robitussin OTC without relief.  He denies any unilateral swelling in his throat.  He denies any chest pain.  Denies any heart palpitations.  He denies any fevers or chills.  Denies any sick contacts or COVID exposures.  Home Medications Prior to Admission medications   Medication Sig Start Date End Date Taking? Authorizing Provider  acyclovir (ZOVIRAX) 800 MG tablet Take 800 mg by mouth daily as needed (for flare-up). 02/15/13   [provider]  aspirin 81 MG tablet Take 81 mg by mouth daily.    [provider]  atenolol (TENORMIN) 50 MG tablet Take 1 tablet (50 mg total) by mouth daily. 05/18/20   Shelda Pal, DO  cetirizine (ZYRTEC) 10 MG tablet Take 10 mg by mouth as needed. 03/08/21   [provider]  clotrimazole-betamethasone (LOTRISONE) cream Apply 1 application topically at bedtime. To feet 05/14/19   Wendling, [name], DO  diltiazem (CARDIZEM) 30 MG tablet Take 1 tablet (30 mg total) by mouth 4 (four) times daily as needed (elevated HR). 05/07/21   Chandrasekhar, Mahesh A, MD  FREESTYLE LITE test strip USE 1 STRIP DAILY TO MONITOR GLUCOSE LEVELS 03/30/21   Renato Shin, MD  JARDIANCE 25 MG TABS tablet TAKE 1 TABLET DAILY 06/29/20   Elayne Snare, MD  Lancets (FREESTYLE) lancets 1 each by Other route daily. Use to monitor glucose levels daily; E11.9 09/19/18   Renato Shin, MD  losartan (COZAAR) 50 MG tablet   12/14/20   [provider]  meclizine (ANTIVERT) 12.5 MG tablet Take 1 tablet (12.5 mg total) by mouth 2 (two) times daily as needed for dizziness. 02/26/20   Rayna Sexton, PA-C  metFORMIN (GLUCOPHAGE-XR) 500 MG 24 hr tablet TAKE 2 TABLETS TWICE A DAY 05/26/21   Elayne Snare, MD  Multiple Vitamin (MULTIVITAMIN) tablet Take 1 tablet by mouth daily.    [provider]  olopatadine (PATANOL) 0.1 % ophthalmic solution Apply 1 drop to eye daily as needed (for itchy and dry eyes).    [provider]  pioglitazone (ACTOS) 15 MG tablet Take 2 tablets (30 mg total) by mouth daily. 09/15/21   Elayne Snare, MD  rosuvastatin (CRESTOR) 10 MG tablet TAKE 1 TABLET DAILY 06/29/20   Shelda Pal, DO  RYBELSUS 3 MG TABS TAKE 1 TABLET DAILY [place] WITH WATER 06/24/21   Elayne Snare, MD  sildenafil (VIAGRA) 100 MG tablet Take 100 mg by mouth daily as needed for erectile dysfunction. Use as directed    [provider]  tamsulosin (FLOMAX) 0.4 MG CAPS capsule Take 0.4 mg by mouth daily. 04/19/21   [provider]  testosterone cypionate (DEPOTESTOSTERONE CYPIONATE) 200 MG/ML injection Inject 200 mg into the muscle every 14 (fourteen) days. 11/19/19   [provider]  triamcinolone (KENALOG) 0.1 % Apply 1 application topically at bedtime. 12/06/19   [provider]  Allergies    Actos [pioglitazone], Trandolapril, Azithromycin, Other, and Tetracycline    Review of Systems   Review of Systems  HENT:  Positive for congestion.   Respiratory:  Positive for cough.   All other systems reviewed and are negative.   Physical Exam Updated Vital Signs BP 135/73 (BP Location: Right Arm)   Pulse 70   Temp 98.5 F (36.9 C) (Oral)   Resp 16   SpO2 98%  Physical Exam Vitals and nursing note reviewed.  Constitutional:      General: He is not in acute distress.    Appearance: He is well-developed.  HENT:     Head: Normocephalic  and atraumatic.     Right Ear: Tympanic membrane normal.     Left Ear: Tympanic membrane normal.     Nose: Congestion present.     Mouth/Throat:     Pharynx: Posterior oropharyngeal erythema present.     Tonsils: No tonsillar exudate or tonsillar abscesses.  Eyes:     Conjunctiva/sclera: Conjunctivae normal.  Cardiovascular:     Rate and Rhythm: Normal rate and regular rhythm.  Pulmonary:     Effort: Pulmonary effort is normal. No respiratory distress.     Breath sounds: Normal breath sounds.  Abdominal:     Palpations: Abdomen is soft.     Tenderness: There is no abdominal tenderness.  Musculoskeletal:        General: No swelling.     Cervical back: Neck supple.  Skin:    General: Skin is warm and dry.     Capillary Refill: Capillary refill takes less than 2 seconds.  Neurological:     Mental Status: He is alert.  Psychiatric:        Mood and Affect: Mood normal.     ED Results / Procedures / Treatments   Labs (all labs ordered are listed, but only abnormal results are displayed) Labs Reviewed  SARS CORONAVIRUS 2 BY RT PCR    EKG None  Radiology DG Chest Portable 1 View  Result Date: 09/19/2021 CLINICAL DATA:  Cough and congestion for 3 days EXAM: PORTABLE CHEST 1 VIEW COMPARISON:  06/28/2011 FINDINGS: Heart size and mediastinal contours are unremarkable. There is no pleural effusion or edema. No airspace opacities identified. Fractured orthopedic screw identified within the right shoulder. IMPRESSION: No active cardiopulmonary abnormalities. Electronically Signed   By: Kerby Moors M.D.   On: 09/19/2021 09:51    Procedures Procedures    Medications Ordered in ED Medications - No data to display  ED Course/ Medical Decision Making/ A&P                           Medical Decision Making Amount and/or Complexity of Data Reviewed Radiology: ordered.   71 year old male with medical history significant for HTN, DM 2, mild carotid atherosclerosis, HLD, BPH who  presents to the emergency department with nasal congestion, chest congestion with a mild productive cough, sore throat for the past 3 days.  He has been taking Robitussin OTC without relief.  He denies any unilateral swelling in his throat.  He denies any chest pain.  Denies any heart palpitations.  He denies any fevers or chills.  Denies any sick contacts or COVID exposures.  On arrival, the patient was vitally stable, afebrile, not tachycardic or tachypneic, saturating 98% on room air, hemodynamically stable.  Sinus rhythm noted on cardiac telemetry.  Physical exam generally unremarkable.  Lungs CTAB.  Presenting with roughly  3 days of a cough and URI symptoms.  COVID-19 and influenza PCR testing was collected and resulted negative.  Chest x-ray was performed and resulted negative for pneumothorax or pneumonia.  On my exam, the patient is well-appearing and well-hydrated.  The patient's lungs are clear to auscultation bilaterally. Additionally, the patient has a soft/non-tender abdomen, clear tympanic membranes, and no oropharyngeal exudates.  There are no signs of meningismus.  I see no signs of an acute bacterial infection.  The patient's presentation is most consistent with a viral upper respiratory infection.  I have a low suspicion for pneumonia as the patient's cough has been non-productive and the patient is neither tachypneic nor hypoxic on room air.  Additionally, the patient is CTAB.  I discussed symptomatic management, including hydration, motrin, and tylenol. The patient felt safe being discharged from the ED.  They agreed to followup with the PCP if needed.  I provided ED return precautions.  Final Clinical Impression(s) / ED Diagnoses Final diagnoses:  Viral URI with cough    Rx / DC Orders ED Discharge Orders     None         Regan Lemming, MD 09/19/21 1013

## 2021-09-19 NOTE — ED Triage Notes (Signed)
Pt presents POV from home for chest congestion, nasal congestion, sore throat and cough x3 days. Reports he gets "some stuff at times" Pt taking Robitussin OTC with no relief

## 2021-10-07 LAB — HM DIABETES EYE EXAM

## 2021-10-08 ENCOUNTER — Other Ambulatory Visit: Payer: Self-pay

## 2021-10-08 DIAGNOSIS — E1165 Type 2 diabetes mellitus with hyperglycemia: Secondary | ICD-10-CM

## 2021-10-08 MED ORDER — PIOGLITAZONE HCL 15 MG PO TABS: 30.0000 mg | ORAL_TABLET | Freq: Every day | ORAL | 1 refills | Status: DC

## 2021-10-11 ENCOUNTER — Encounter: Payer: Self-pay | Admitting: Endocrinology

## 2021-10-18 ENCOUNTER — Other Ambulatory Visit: Payer: Self-pay

## 2021-10-18 DIAGNOSIS — E1165 Type 2 diabetes mellitus with hyperglycemia: Secondary | ICD-10-CM

## 2021-10-18 MED ORDER — PIOGLITAZONE HCL 15 MG PO TABS: 30.0000 mg | ORAL_TABLET | Freq: Every day | ORAL | 3 refills | Status: DC

## 2022-01-10 ENCOUNTER — Ambulatory Visit (INDEPENDENT_AMBULATORY_CARE_PROVIDER_SITE_OTHER): Payer: Medicare Other | Admitting: Gastroenterology

## 2022-01-10 ENCOUNTER — Encounter: Payer: Self-pay | Admitting: Gastroenterology

## 2022-01-10 VITALS — BP 110/50 | HR 67 | Ht 66.5 in | Wt 160.6 lb

## 2022-01-10 DIAGNOSIS — J029 Acute pharyngitis, unspecified: Secondary | ICD-10-CM | POA: Insufficient documentation

## 2022-01-10 DIAGNOSIS — R059 Cough, unspecified: Secondary | ICD-10-CM

## 2022-01-10 DIAGNOSIS — K219 Gastro-esophageal reflux disease without esophagitis: Secondary | ICD-10-CM

## 2022-01-10 MED ORDER — OMEPRAZOLE 20 MG PO CPDR: 20.0000 mg | DELAYED_RELEASE_CAPSULE | Freq: Two times a day (BID) | ORAL | 3 refills | Status: DC

## 2022-01-10 NOTE — Progress Notes (Signed)
Addendum: Reviewed and agree with assessment and management plan. Rahul Malinak M, MD  

## 2022-01-10 NOTE — Patient Instructions (Signed)
We have sent the following medications to your pharmacy for you to pick up at your convenience: Omeprazole 20 mg twice daily 30-60 minutes before breakfast and dinner.   Call or send Mychart message in 4 weeks with an update.   _______________________________________________________  If you are age 71 or older, your body mass index should be between 23-30. Your Body mass index is 25.53 kg/m. If this is out of the aforementioned range listed, please consider follow up with your Primary Care Provider.  If you are age 91 or younger, your body mass index should be between 19-25. Your Body mass index is 25.53 kg/m. If this is out of the aformentioned range listed, please consider follow up with your Primary Care Provider.   ________________________________________________________  The [facility] providers would like to encourage you to use [facility] to communicate with providers for non-urgent requests or questions.  Due to long hold times on the telephone, sending your provider a message by [facility] may be a faster and more efficient way to get a response.  Please allow 48 business hours for a response.  Please remember that this is for non-urgent requests.  _______________________________________________________

## 2022-01-10 NOTE — Progress Notes (Signed)
01/10/2022 [name] [id number] 11/03/1950   HISTORY OF PRESENT ILLNESS: This is a 72 year old male who is a patient of Dr. Vena Rua.  He was seen here in 2018/2019 for evaluation of iron deficiency anemia and underwent EGD 01/2017 at that time, which showed the following:   - Normal esophagus. - Small hiatal hernia. - Gastritis. Biopsied. - Normal examined duodenum. Biopsied  1. Surgical [P], duodenum - BENIGN SMALL BOWEL MUCOSA. - NO ACTIVE INFLAMMATION OR VILLOUS ATROPHY IDENTIFIED. 2. Surgical [P], stomach - CHRONIC INACTIVE GASTRITIS, MILD. - THERE IS NO EVIDENCE OF HELICOBACTER PYLORI, DYSPLASIA, OR MALIGNANCY. - SEE COMMENT.  He is here today with complaints of what his PCP says is likely reflux.  He said that for several months he has been having irritation in his esophagus with soreness.  Says he has been waking up with sore throats in the morning.  Also he has been coughing.  No abdominal pain per se.  No refluxing of food material.  He was placed on omeprazole 20 mg daily and something else that sounds like may have been an H2 blocker.  Said his symptoms got better so he is no longer on the other medication, but is on the omeprazole 20 mg once daily.   Past Medical History:  Diagnosis Date   Arthritis    right shoulder   BPH (benign prostatic hyperplasia)    Hyperlipidemia    Hypertension    Impotence of organic origin    Iron deficiency anemia    Mild atherosclerosis of carotid artery, bilateral    last doppler 12-17-2010   bilateral ICA 1-39%   Nocturia more than twice per night    Testicular hypofunction    Type 2 diabetes mellitus [name])    endocrinologist-- dr Georgiana Shore urinary stream    Wears glasses    Past Surgical History:  Procedure Laterality Date   CYSTOSCOPY WITH INSERTION OF UROLIFT N/A 12/12/2017   Procedure: CYSTOSCOPY WITH INSERTION OF UROLIFT;  Surgeon: Festus Aloe, MD;  Location: [facility];  Service:  Urology;  Laterality: N/A;   SHOULDER SURGERY Right 2004, 1990   WISDOM TOOTH EXTRACTION      reports that he has never smoked. He has never used smokeless tobacco. He reports that he does not currently use alcohol. He reports that he does not use drugs. family history includes Cancer in his paternal aunt and paternal aunt; Diabetes in his brother; Diabetes (age of onset: 46) in his father; Healthy in his sister; Heart attack in his brother, maternal grandmother, mother, sister, and sister; Heart disease in his sister; Heart disease (age of onset: 85) in his mother; Hypertension in his father and mother. Allergies  Allergen Reactions   Actos [Pioglitazone] Swelling    edema   Trandolapril Swelling     w/ Angioedema   Azithromycin Rash   Other Rash    MAGIC MOUTHWASH   Tetracycline Rash      Outpatient Encounter Medications as of 01/10/2022  Medication Sig   acyclovir (ZOVIRAX) 800 MG tablet Take 800 mg by mouth daily as needed (for flare-up).   aspirin 81 MG tablet Take 81 mg by mouth daily.   atenolol (TENORMIN) 50 MG tablet Take 1 tablet (50 mg total) by mouth daily.   cetirizine (ZYRTEC) 10 MG tablet Take 10 mg by mouth as needed.   diltiazem (CARDIZEM) 30 MG tablet Take 1 tablet (30 mg total) by mouth 4 (four) times daily as needed (elevated  HR).   FREESTYLE LITE test strip USE 1 STRIP DAILY TO MONITOR GLUCOSE LEVELS   JARDIANCE 25 MG TABS tablet TAKE 1 TABLET DAILY   Lancets (FREESTYLE) lancets 1 each by Other route daily. Use to monitor glucose levels daily; E11.9   losartan (COZAAR) 50 MG tablet    meclizine (ANTIVERT) 12.5 MG tablet Take 1 tablet (12.5 mg total) by mouth 2 (two) times daily as needed for dizziness.   metFORMIN (GLUCOPHAGE-XR) 500 MG 24 hr tablet TAKE 2 TABLETS TWICE A DAY   Multiple Vitamin (MULTIVITAMIN) tablet Take 1 tablet by mouth daily.   olopatadine (PATANOL) 0.1 % ophthalmic solution Apply 1 drop to eye daily as needed (for itchy and dry eyes).    pioglitazone (ACTOS) 15 MG tablet Take 2 tablets (30 mg total) by mouth daily.   rosuvastatin (CRESTOR) 10 MG tablet TAKE 1 TABLET DAILY   RYBELSUS 3 MG TABS TAKE 1 TABLET DAILY 30 MINUTES BEFORE BREAKFAST WITH WATER   sildenafil (VIAGRA) 100 MG tablet Take 100 mg by mouth daily as needed for erectile dysfunction. Use as directed   testosterone cypionate (DEPOTESTOSTERONE CYPIONATE) 200 MG/ML injection Inject 200 mg into the muscle every 14 (fourteen) days.   triamcinolone (KENALOG) 0.1 % Apply 1 application topically at bedtime.   [DISCONTINUED] clotrimazole-betamethasone (LOTRISONE) cream Apply 1 application topically at bedtime. To feet   [DISCONTINUED] tamsulosin (FLOMAX) 0.4 MG CAPS capsule Take 0.4 mg by mouth daily.   No facility-administered encounter medications on file as of 01/10/2022.     REVIEW OF SYSTEMS  : All other systems reviewed and negative except where noted in the History of Present Illness.   PHYSICAL EXAM: BP (!) 110/50   Pulse 67   Ht 5' 6.5" (1.689 m)   Wt 160 lb 9.6 oz (72.8 kg)   SpO2 99%   BMI 25.53 kg/m  General: Well developed male AA male in no acute distress Head: Normocephalic and atraumatic Eyes:  Sclerae anicteric, conjunctiva pink. Ears: Normal auditory acuity Lungs: Clear throughout to auscultation; no W/R/R. Heart: Regular rate and rhythm; no M/R/G. Abdomen: Soft, non-distended.  BS present.  Minimal epigastric TTP. Musculoskeletal: Symmetrical with no gross deformities  Skin: No lesions on visible extremities Extremities: No edema  Neurological: Alert oriented x 4, grossly non-focal Psychological:  Alert and cooperative. Normal mood and affect  ASSESSMENT AND PLAN: *GERD, sore throat in AM, cough:  Symptoms improved, but not resolved yet with omeprazole 20 mg daily and another medication that he was taking for reflux (he does not know the name but sounds like maybe was an H2 blocker, but he is no longer using this one).  We discussed reflux  diet and he was given literature on this.  We are going to increase his omeprazole to 20 mg twice daily for a while to see if this helps with his symptoms further.  New prescription sent to pharmacy.  He will send a message via MyChart in approximately 4 weeks with an update.   CC:  No ref. provider found

## 2022-01-11 ENCOUNTER — Other Ambulatory Visit (INDEPENDENT_AMBULATORY_CARE_PROVIDER_SITE_OTHER): Payer: Medicare Other

## 2022-01-11 DIAGNOSIS — E1165 Type 2 diabetes mellitus with hyperglycemia: Secondary | ICD-10-CM | POA: Diagnosis not present

## 2022-01-11 LAB — BASIC METABOLIC PANEL
BUN: 16 mg/dL (ref 6–23)
CO2: 24 mEq/L (ref 19–32)
Calcium: 9.1 mg/dL (ref 8.4–10.5)
Chloride: 103 mEq/L (ref 96–112)
Creatinine, Ser: 0.93 mg/dL (ref 0.40–1.50)
GFR: 83.33 mL/min (ref >60.00)
Glucose, Bld: 121 mg/dL — ABNORMAL HIGH (ref 70–99)
Potassium: 3.9 mEq/L (ref 3.5–5.1)
Sodium: 140 mEq/L (ref 135–145)

## 2022-01-11 LAB — HEMOGLOBIN A1C: Hgb A1c MFr Bld: 7.1 % — ABNORMAL HIGH (ref 4.6–6.5)

## 2022-01-13 ENCOUNTER — Encounter: Payer: Self-pay | Admitting: Endocrinology

## 2022-01-13 ENCOUNTER — Ambulatory Visit (INDEPENDENT_AMBULATORY_CARE_PROVIDER_SITE_OTHER): Payer: Medicare Other | Admitting: Endocrinology

## 2022-01-13 VITALS — BP 120/70 | HR 62 | Ht 66.5 in | Wt 159.8 lb

## 2022-01-13 DIAGNOSIS — E1165 Type 2 diabetes mellitus with hyperglycemia: Secondary | ICD-10-CM

## 2022-01-13 NOTE — Progress Notes (Signed)
Patient ID: [name], male   DOB: 13-May-1950, 72 y.o.   MRN: [id number]           Reason for Appointment: Follow-up for Type 2 Diabetes   History of Present Illness:          Date of diagnosis of type 2 diabetes mellitus:    1996     Background history:  Initially was on Metformin and possibly other medication but details are not available His A1c since 2016 has ranged between 6 and 8.1, mostly in the low 7 range He was started on Invokana in 2017 and also was given acarbose and Prandin in 2018 in addition He has also been prescribed bromocriptine to take at bedtime  Recent history:   A1c is 7.1 compared to 7.5  Previous fructosamine is 239  Non-insulin hypoglycemic drugs currently: Actos 15 mg daily, Rybelsus 3 mg daily, Jardiance 25 mg, Metformin 2 g  Current management, blood sugar patterns and problems identified: He has checked blood sugars again only before his first meal late morning and not after meals despite reminders Also his lab glucose fasting was fairly good Highest glucose at home recently is only 136 He has gained back about 8 pounds with adding Actos, previously was tending to have some weight loss Also is able to do some exercise but only about twice a week on an average for now No side effects like edema with Actos He is concerned about multiple medications       Side effects from medications have been: None  Typical meal intake: Breakfast is oatmeal with eggs   Lunch usually fruit.  Dinner is usually vegetables, usually avoiding fried food and carbohydrates like rice and pasta               Glucose monitoring:  done 1 times a day         Glucometer:  Freestyle lite.       Blood Glucose readings by download show FASTING range 118-136    Dietician visit, most recent: 10/2019  Weight history:  Wt Readings from Last 3 Encounters:  01/13/22 159 lb 12.8 oz (72.5 kg)  01/10/22 160 lb 9.6 oz (72.8 kg)  09/15/21 151 lb 12.8 oz (68.9 kg)     Glycemic control:   Lab Results  Component Value Date   HGBA1C 7.1 (H) 01/11/2022   HGBA1C 7.5 (H) 09/13/2021   HGBA1C 7.1 (H) 04/30/2021   Lab Results  Component Value Date   MICROALBUR <0.7 04/30/2021   LDLCALC 73 12/16/2020   CREATININE 0.93 01/11/2022   Lab Results  Component Value Date   MICRALBCREAT 1.1 04/30/2021    Lab Results  Component Value Date   FRUCTOSAMINE 239 10/03/2019    Lab on 01/11/2022  Component Date Value Ref Range Status   Sodium 01/11/2022 140  135 - 145 mEq/L Final   Potassium 01/11/2022 3.9  3.5 - 5.1 mEq/L Final   Chloride 01/11/2022 103  96 - 112 mEq/L Final   CO2 01/11/2022 24  19 - 32 mEq/L Final   Glucose, Bld 01/11/2022 121 (H)  70 - 99 mg/dL Final   BUN 01/11/2022 16  6 - 23 mg/dL Final   Creatinine, Ser 01/11/2022 0.93  0.40 - 1.50 mg/dL Final   GFR 01/11/2022 83.33  >60.00 mL/min Final   Calculated using the CKD-EPI Creatinine Equation (2021)   Calcium 01/11/2022 9.1  8.4 - 10.5 mg/dL Final   Hgb A1c MFr Bld 01/11/2022 7.1 (H)  4.6 -  6.5 % Final   Glycemic Control Guidelines for People with Diabetes:Non Diabetic:  <6%Goal of Therapy: <7%Additional Action Suggested:  >8%     Allergies as of 01/13/2022       Reactions   Actos [pioglitazone] Swelling   edema   Trandolapril Swelling    w/ Angioedema   Azithromycin Rash   Other Rash   MAGIC MOUTHWASH   Tetracycline Rash        Medication List        Accurate as of January 13, 2022  8:51 AM. If you have any questions, ask your nurse or doctor.          acyclovir 800 MG tablet Commonly known as: ZOVIRAX Take 800 mg by mouth daily as needed (for flare-up).   aspirin 81 MG tablet Take 81 mg by mouth daily.   atenolol 50 MG tablet Commonly known as: TENORMIN Take 1 tablet (50 mg total) by mouth daily.   cetirizine 10 MG tablet Commonly known as: ZYRTEC Take 10 mg by mouth as needed.   diltiazem 30 MG tablet Commonly known as: Cardizem Take 1 tablet (30 mg  total) by mouth 4 (four) times daily as needed (elevated HR).   freestyle lancets 1 each by Other route daily. Use to monitor glucose levels daily; E11.9   FREESTYLE LITE test strip Generic drug: glucose blood USE 1 STRIP DAILY TO MONITOR GLUCOSE LEVELS   Jardiance 25 MG Tabs tablet Generic drug: empagliflozin TAKE 1 TABLET DAILY   losartan 50 MG tablet Commonly known as: COZAAR   meclizine 12.5 MG tablet Commonly known as: ANTIVERT Take 1 tablet (12.5 mg total) by mouth 2 (two) times daily as needed for dizziness.   metFORMIN 500 MG 24 hr tablet Commonly known as: GLUCOPHAGE-XR TAKE 2 TABLETS TWICE A DAY   multivitamin tablet Take 1 tablet by mouth daily.   olopatadine 0.1 % ophthalmic solution Commonly known as: PATANOL Apply 1 drop to eye daily as needed (for itchy and dry eyes).   omeprazole 20 MG capsule Commonly known as: PRILOSEC Take 1 capsule (20 mg total) by mouth 2 (two) times daily before a meal.   pioglitazone 15 MG tablet Commonly known as: Actos Take 2 tablets (30 mg total) by mouth daily.   rosuvastatin 10 MG tablet Commonly known as: CRESTOR TAKE 1 TABLET DAILY   Rybelsus 3 MG Tabs Generic drug: Semaglutide TAKE 1 TABLET DAILY 30 MINUTES BEFORE BREAKFAST WITH WATER   sildenafil 100 MG tablet Commonly known as: VIAGRA Take 100 mg by mouth daily as needed for erectile dysfunction. Use as directed   testosterone cypionate 200 MG/ML injection Commonly known as: DEPOTESTOSTERONE CYPIONATE Inject 200 mg into the muscle every 14 (fourteen) days.   triamcinolone cream 0.1 % Commonly known as: KENALOG Apply 1 application topically at bedtime.        Allergies:  Allergies  Allergen Reactions   Actos [Pioglitazone] Swelling    edema   Trandolapril Swelling     w/ Angioedema   Azithromycin Rash   Other Rash    MAGIC MOUTHWASH   Tetracycline Rash    Past Medical History:  Diagnosis Date   Arthritis    right shoulder   BPH (benign  prostatic hyperplasia)    Hyperlipidemia    Hypertension    Impotence of organic origin    Iron deficiency anemia    Mild atherosclerosis of carotid artery, bilateral    last doppler 12-17-2010   bilateral ICA 1-39%   Nocturia  more than twice per night    Testicular hypofunction    Type 2 diabetes mellitus [facility])    endocrinologist-- dr Georgiana Shore urinary stream    Wears glasses     Past Surgical History:  Procedure Laterality Date   CYSTOSCOPY WITH INSERTION OF UROLIFT N/A 12/12/2017   Procedure: CYSTOSCOPY WITH INSERTION OF UROLIFT;  Surgeon: Festus Aloe, MD;  Location: [facility];  Service: Urology;  Laterality: N/A;   SHOULDER SURGERY Right 2004, 1990   WISDOM TOOTH EXTRACTION      Family History  Problem Relation Age of Onset   Heart disease Mother 7       Deceased   Heart attack Mother    Hypertension Mother    Diabetes Father 97       Deceased   Hypertension Father    Heart disease Sister        heart trouble   Heart attack Sister    Healthy Sister        x6   Heart attack Sister    Diabetes Brother    Heart attack Brother    Heart attack Maternal Grandmother    Cancer Paternal Aunt        x1-Bone   Cancer Paternal Aunt        multiple   Colon cancer Neg Hx    Esophageal cancer Neg Hx    Liver cancer Neg Hx    Stomach cancer Neg Hx    Pancreatic cancer Neg Hx     Social History:  reports that he has never smoked. He has never used smokeless tobacco. He reports that he does not currently use alcohol. He reports that he does not use drugs.   Review of Systems   Lipid history: Being treated with rosuvastatin 10 mg by PCP, lipid panel s follows    Lab Results  Component Value Date   CHOL 137 12/16/2020   HDL 49.60 12/16/2020   LDLCALC 73 12/16/2020   LDLDIRECT 146.5 10/29/2012   TRIG 75.0 12/16/2020   CHOLHDL 3 12/16/2020           Hypertension: This is mild, has been treated with atenolol only and followed by  PCP  BP Readings from Last 3 Encounters:  01/13/22 120/70  01/10/22 (!) 110/50  09/19/21 136/75    Most recent eye exam was in 2/22, has had stable background retinopathy  Most recent foot exam: 12/22  Currently known complications of diabetes: Background retinopathy  LABS:  Lab on 01/11/2022  Component Date Value Ref Range Status   Sodium 01/11/2022 140  135 - 145 mEq/L Final   Potassium 01/11/2022 3.9  3.5 - 5.1 mEq/L Final   Chloride 01/11/2022 103  96 - 112 mEq/L Final   CO2 01/11/2022 24  19 - 32 mEq/L Final   Glucose, Bld 01/11/2022 121 (H)  70 - 99 mg/dL Final   BUN 01/11/2022 16  6 - 23 mg/dL Final   Creatinine, Ser 01/11/2022 0.93  0.40 - 1.50 mg/dL Final   GFR 01/11/2022 83.33  >60.00 mL/min Final   Calculated using the CKD-EPI Creatinine Equation (2021)   Calcium 01/11/2022 9.1  8.4 - 10.5 mg/dL Final   Hgb A1c MFr Bld 01/11/2022 7.1 (H)  4.6 - 6.5 % Final   Glycemic Control Guidelines for People with Diabetes:Non Diabetic:  <6%Goal of Therapy: <7%Additional Action Suggested:  >8%     Physical Examination:  BP 120/70   Pulse 62   Ht  5' 6.5" (1.689 m)   Wt 159 lb 12.8 oz (72.5 kg)   SpO2 97%   BMI 25.41 kg/m      ASSESSMENT:  Diabetes type 2 non-insulin-dependent  See history of present illness for detailed discussion of current diabetes management, blood sugar patterns and problems identified  He is on a regimen of Actos, 3 mg Rybelsus, Metformin and Jardiance 25 mg  He has gained weight from adding Actos for also likely from increased intake Currently no side effects like edema with this Also this is likely neutralizing any tendency to weight loss with Jardiance A1c is not at best but improving and he needs to check his blood sugars after meals which he does not Hypertension: Blood pressure well controlled  LIPIDS: monitored by PCP  PLAN:     No change in medication When he finishes Jardiance will switch him to [place] equivalent  doses  There are no Patient Instructions on file for this visit.    Elayne Snare 01/13/2022, 8:51 AM   Note: This office note was prepared with Dragon voice recognition system technology. Any transcriptional errors that result from this process are unintentional.

## 2022-01-13 NOTE — Patient Instructions (Addendum)
Check blood sugars on waking up 2 days a week  Also check blood sugars about 2 hours after meals and do this after different meals by rotation  Recommended blood sugar levels on waking up are 90-130 and about 2 hours after meal is 130-160  Please bring your blood sugar monitor to each visit, thank you  Combo name [email or web address], call

## 2022-01-27 ENCOUNTER — Other Ambulatory Visit: Payer: Self-pay

## 2022-01-27 DIAGNOSIS — E1165 Type 2 diabetes mellitus with hyperglycemia: Secondary | ICD-10-CM

## 2022-01-27 MED ORDER — PIOGLITAZONE HCL 15 MG PO TABS: 30.0000 mg | ORAL_TABLET | Freq: Every day | ORAL | 3 refills | Status: DC

## 2022-04-07 ENCOUNTER — Other Ambulatory Visit: Payer: Self-pay | Admitting: *Deleted

## 2022-04-07 DIAGNOSIS — E1165 Type 2 diabetes mellitus with hyperglycemia: Secondary | ICD-10-CM

## 2022-04-07 MED ORDER — EMPAGLIFLOZIN 25 MG PO TABS: 25.0000 mg | ORAL_TABLET | Freq: Every day | ORAL | 3 refills | Status: DC

## 2022-05-13 ENCOUNTER — Other Ambulatory Visit (INDEPENDENT_AMBULATORY_CARE_PROVIDER_SITE_OTHER): Payer: Medicare Other

## 2022-05-13 DIAGNOSIS — E1165 Type 2 diabetes mellitus with hyperglycemia: Secondary | ICD-10-CM | POA: Diagnosis not present

## 2022-05-13 LAB — MICROALBUMIN / CREATININE URINE RATIO
Creatinine,U: 86 mg/dL
Microalb Creat Ratio: 1.7 mg/g (ref 0.0–30.0)
Microalb, Ur: 1.5 mg/dL (ref 0.0–1.9)

## 2022-05-13 LAB — BASIC METABOLIC PANEL
BUN: 12 mg/dL (ref 6–23)
CO2: 27 mEq/L (ref 19–32)
Calcium: 9.2 mg/dL (ref 8.4–10.5)
Chloride: 102 mEq/L (ref 96–112)
Creatinine, Ser: 0.99 mg/dL (ref 0.40–1.50)
GFR: 77.13 mL/min (ref >60.00)
Glucose, Bld: 111 mg/dL — ABNORMAL HIGH (ref 70–99)
Potassium: 3.7 mEq/L (ref 3.5–5.1)
Sodium: 139 mEq/L (ref 135–145)

## 2022-05-13 LAB — HEMOGLOBIN A1C: Hgb A1c MFr Bld: 7 % — ABNORMAL HIGH (ref 4.6–6.5)

## 2022-05-17 ENCOUNTER — Encounter: Payer: Self-pay | Admitting: Endocrinology

## 2022-05-17 ENCOUNTER — Ambulatory Visit (INDEPENDENT_AMBULATORY_CARE_PROVIDER_SITE_OTHER): Payer: Medicare Other | Admitting: Endocrinology

## 2022-05-17 VITALS — BP 136/70 | HR 70 | Ht 66.5 in | Wt 163.0 lb

## 2022-05-17 DIAGNOSIS — E119 Type 2 diabetes mellitus without complications: Secondary | ICD-10-CM

## 2022-05-17 DIAGNOSIS — Z7984 Long term (current) use of oral hypoglycemic drugs: Secondary | ICD-10-CM | POA: Diagnosis not present

## 2022-05-17 NOTE — Progress Notes (Signed)
Patient ID: [name], male   DOB: 01/17/1950, 72 y.o.   MRN: [id number]           Reason for Appointment: Follow-up for Type 2 Diabetes   History of Present Illness:          Date of diagnosis of type 2 diabetes mellitus:    1996     Background history:  Initially was on Metformin and possibly other medication but details are not available His A1c since 2016 has ranged between 6 and 8.1, mostly in the low 7 range He was started on Invokana in 2017 and also was given acarbose and Prandin in 2018 in addition He has also been prescribed bromocriptine to take at bedtime  Recent history:   A1c is about the same at 7%  Previous fructosamine is 239  Non-insulin hypoglycemic drugs currently: Rybelsus 3 mg daily, Jardiance 25 mg, Metformin 2 g  Current management, blood sugar patterns and problems identified: He has checked blood sugars with a new monitor that is possibly being sent to him by the Medicare program and cannot be downloaded Unable to analyze the readings on this monitor and mostly checking fasting readings Blood sugars are generally between 110-120 5 in the morning He has not checked any readings after meals Also lab glucose was 111 fasting He is doing some rowing machine and exercise twice a week  He has had some restoration of his weight loss and this is up another 4 pounds with continuing Actos However about 2 weeks ago he started having a lot of swelling and last week this was started by PCP Has been trying to plan his meals fairly well as before       Side effects from medications have been: None  Typical meal intake: Breakfast is oatmeal with eggs   Lunch usually fruit.  Dinner is usually vegetables, usually avoiding fried food and carbohydrates like rice and pasta               Glucose monitoring:  done 1 times a day         Glucometer: Previously Freestyle lite.       Blood Glucose readings as above Previous readings: FASTING range 118-136  Dietician  visit, most recent: 12/2020  Weight history:  Wt Readings from Last 3 Encounters:  05/17/22 163 lb (73.9 kg)  01/13/22 159 lb 12.8 oz (72.5 kg)  01/10/22 160 lb 9.6 oz (72.8 kg)    Glycemic control:   Lab Results  Component Value Date   HGBA1C 7.0 (H) 05/13/2022   HGBA1C 7.1 (H) 01/11/2022   HGBA1C 7.5 (H) 09/13/2021   Lab Results  Component Value Date   MICROALBUR 1.5 05/13/2022   LDLCALC 73 12/16/2020   CREATININE 0.99 05/13/2022   Lab Results  Component Value Date   MICRALBCREAT 1.7 05/13/2022    Lab Results  Component Value Date   FRUCTOSAMINE 239 10/03/2019    Lab on 05/13/2022  Component Date Value Ref Range Status   Microalb, Ur 05/13/2022 1.5  0.0 - 1.9 mg/dL Final   Creatinine,U 40/98/1191 86.0  mg/dL Final   Microalb Creat Ratio 05/13/2022 1.7  0.0 - 30.0 mg/g Final   Sodium 05/13/2022 139  135 - 145 mEq/L Final   Potassium 05/13/2022 3.7  3.5 - 5.1 mEq/L Final   Chloride 05/13/2022 102  96 - 112 mEq/L Final   CO2 05/13/2022 27  19 - 32 mEq/L Final   Glucose, Bld 05/13/2022 111 (H)  70 -  99 mg/dL Final   BUN 91/47/8295 12  6 - 23 mg/dL Final   Creatinine, Ser 05/13/2022 0.99  0.40 - 1.50 mg/dL Final   GFR 62/13/0865 77.13  >60.00 mL/min Final   Calculated using the CKD-EPI Creatinine Equation (2021)   Calcium 05/13/2022 9.2  8.4 - 10.5 mg/dL Final   Hgb H8I MFr Bld 05/13/2022 7.0 (H)  4.6 - 6.5 % Final   Glycemic Control Guidelines for People with Diabetes:Non Diabetic:  <6%Goal of Therapy: <7%Additional Action Suggested:  >8%     Allergies as of 05/17/2022       Reactions   Actos [pioglitazone] Swelling   edema   Trandolapril Swelling    w/ Angioedema   Azithromycin Rash   Other Rash   MAGIC MOUTHWASH   Tetracycline Rash        Medication List        Accurate as of May 17, 2022  3:39 PM. If you have any questions, ask your nurse or doctor.          acyclovir 800 MG tablet Commonly known as: ZOVIRAX Take 800 mg by mouth daily as  needed (for flare-up).   aspirin 81 MG tablet Take 81 mg by mouth daily.   atenolol 50 MG tablet Commonly known as: TENORMIN Take 1 tablet (50 mg total) by mouth daily.   cetirizine 10 MG tablet Commonly known as: ZYRTEC Take 10 mg by mouth as needed.   diltiazem 30 MG tablet Commonly known as: Cardizem Take 1 tablet (30 mg total) by mouth 4 (four) times daily as needed (elevated HR).   empagliflozin 25 MG Tabs tablet Commonly known as: Jardiance Take 1 tablet (25 mg total) by mouth daily.   freestyle lancets 1 each by Other route daily. Use to monitor glucose levels daily; E11.9   FREESTYLE LITE test strip Generic drug: glucose blood USE 1 STRIP DAILY TO MONITOR GLUCOSE LEVELS   losartan 50 MG tablet Commonly known as: COZAAR   meclizine 12.5 MG tablet Commonly known as: ANTIVERT Take 1 tablet (12.5 mg total) by mouth 2 (two) times daily as needed for dizziness.   metFORMIN 500 MG 24 hr tablet Commonly known as: GLUCOPHAGE-XR TAKE 2 TABLETS TWICE A DAY   multivitamin tablet Take 1 tablet by mouth daily.   olopatadine 0.1 % ophthalmic solution Commonly known as: PATANOL Apply 1 drop to eye daily as needed (for itchy and dry eyes).   omeprazole 20 MG capsule Commonly known as: PRILOSEC Take 1 capsule (20 mg total) by mouth 2 (two) times daily before a meal.   pioglitazone 15 MG tablet Commonly known as: Actos Take 2 tablets (30 mg total) by mouth daily.   rosuvastatin 10 MG tablet Commonly known as: CRESTOR TAKE 1 TABLET DAILY   Rybelsus 3 MG Tabs Generic drug: Semaglutide TAKE 1 TABLET DAILY 30 MINUTES BEFORE BREAKFAST WITH WATER   sildenafil 100 MG tablet Commonly known as: VIAGRA Take 100 mg by mouth daily as needed for erectile dysfunction. Use as directed   testosterone cypionate 200 MG/ML injection Commonly known as: DEPOTESTOSTERONE CYPIONATE Inject 200 mg into the muscle every 14 (fourteen) days.   triamcinolone cream 0.1 % Commonly known  as: KENALOG Apply 1 application topically at bedtime.        Allergies:  Allergies  Allergen Reactions   Actos [Pioglitazone] Swelling    edema   Trandolapril Swelling     w/ Angioedema   Azithromycin Rash   Other Rash  MAGIC MOUTHWASH   Tetracycline Rash    Past Medical History:  Diagnosis Date   Arthritis    right shoulder   BPH (benign prostatic hyperplasia)    Hyperlipidemia    Hypertension    Impotence of organic origin    Iron deficiency anemia    Mild atherosclerosis of carotid artery, bilateral    last doppler 12-17-2010   bilateral ICA 1-39%   Nocturia more than twice per night    Testicular hypofunction    Type 2 diabetes mellitus [facility])    endocrinologist-- dr Merri Ray urinary stream    Wears glasses     Past Surgical History:  Procedure Laterality Date   CYSTOSCOPY WITH INSERTION OF UROLIFT N/A 12/12/2017   Procedure: CYSTOSCOPY WITH INSERTION OF UROLIFT;  Surgeon: Jerilee Field, MD;  Location: [facility];  Service: Urology;  Laterality: N/A;   SHOULDER SURGERY Right 2004, 1990   WISDOM TOOTH EXTRACTION      Family History  Problem Relation Age of Onset   Heart disease Mother 61       Deceased   Heart attack Mother    Hypertension Mother    Diabetes Father 49       Deceased   Hypertension Father    Heart disease Sister        heart trouble   Heart attack Sister    Healthy Sister        x6   Heart attack Sister    Diabetes Brother    Heart attack Brother    Heart attack Maternal Grandmother    Cancer Paternal Aunt        x1-Bone   Cancer Paternal Aunt        multiple   Colon cancer Neg Hx    Esophageal cancer Neg Hx    Liver cancer Neg Hx    Stomach cancer Neg Hx    Pancreatic cancer Neg Hx     Social History:  reports that he has never smoked. He has never used smokeless tobacco. He reports that he does not currently use alcohol. He reports that he does not use drugs.   Review of Systems   Lipid  history: Being treated with rosuvastatin 10 mg by PCP, lipid panel as follows Has had labs done through PCP recently but no results available   Lab Results  Component Value Date   CHOL 137 12/16/2020   HDL 49.60 12/16/2020   LDLCALC 73 12/16/2020   LDLDIRECT 146.5 10/29/2012   TRIG 75.0 12/16/2020   CHOLHDL 3 12/16/2020           Hypertension: This is mild, has been treated with atenolol only and followed by PCP  BP Readings from Last 3 Encounters:  05/17/22 136/70  01/13/22 120/70  01/10/22 (!) 110/50    Most recent eye exam was in 10/23, has had stable background retinopathy  Most recent foot exam: 5/24  Currently known complications of diabetes: Background retinopathy  LABS:  Lab on 05/13/2022  Component Date Value Ref Range Status   Microalb, Ur 05/13/2022 1.5  0.0 - 1.9 mg/dL Final   Creatinine,U 65/78/4696 86.0  mg/dL Final   Microalb Creat Ratio 05/13/2022 1.7  0.0 - 30.0 mg/g Final   Sodium 05/13/2022 139  135 - 145 mEq/L Final   Potassium 05/13/2022 3.7  3.5 - 5.1 mEq/L Final   Chloride 05/13/2022 102  96 - 112 mEq/L Final   CO2 05/13/2022 27  19 - 32 mEq/L  Final   Glucose, Bld 05/13/2022 111 (H)  70 - 99 mg/dL Final   BUN 16/10/9602 12  6 - 23 mg/dL Final   Creatinine, Ser 05/13/2022 0.99  0.40 - 1.50 mg/dL Final   GFR 54/09/8117 77.13  >60.00 mL/min Final   Calculated using the CKD-EPI Creatinine Equation (2021)   Calcium 05/13/2022 9.2  8.4 - 10.5 mg/dL Final   Hgb J4N MFr Bld 05/13/2022 7.0 (H)  4.6 - 6.5 % Final   Glycemic Control Guidelines for People with Diabetes:Non Diabetic:  <6%Goal of Therapy: <7%Additional Action Suggested:  >8%     Physical Examination:  BP 136/70 (BP Location: Left Arm, Patient Position: Sitting, Cuff Size: Normal)   Pulse 70   Ht 5' 6.5" (1.689 m)   Wt 163 lb (73.9 kg)   SpO2 99%   BMI 25.91 kg/m      Diabetic Foot Exam - Simple   Simple Foot Form Diabetic Foot exam was performed with the following findings: Yes    Visual Inspection No deformities, no ulcerations, no other skin breakdown bilaterally: Yes Sensation Testing Intact to touch and monofilament testing bilaterally: Yes Pulse Check Posterior Tibialis and Dorsalis pulse intact bilaterally: Yes Comments     ASSESSMENT:  Diabetes type 2 non-insulin-dependent  See history of present illness for detailed discussion of current diabetes management, blood sugar patterns and problems identified  He is on a regimen of 3 mg Rybelsus, Metformin and Jardiance 25 mg  He has gained back weight from adding Actos which however was stopped last week because of edema Did not previously have edema A1c is 7% but not clear what his postprandial readings are  Also does need to have more regular exercise program Has been generally following the diet recommended by nutritionist previously  Urine microalbumin normal  PLAN:    Continue 3 drug regimen as above Discussed needing to start checking his blood sugar with his FreeStyle meter again and alternate fasting and postprandial readings Regular exercise program both aerobic and strengthening  He will try to get labs faxed from his PCP office   Patient Instructions  Check blood sugars on waking up 2-3 days a week  Also check blood sugars about 2 hours after meals and do this after different meals by rotation  Recommended blood sugar levels on waking up are 90-130 and about 2 hours after meal is 130-160  Please bring your blood sugar monitor to each visit, thank you     Reather Littler 05/17/2022, 3:39 PM   Note: This office note was prepared with Dragon voice recognition system technology. Any transcriptional errors that result from this process are unintentional.

## 2022-05-17 NOTE — Patient Instructions (Signed)
Check blood sugars on waking up 2-3 days a week  Also check blood sugars about 2 hours after meals and do this after different meals by rotation  Recommended blood sugar levels on waking up are 90-130 and about 2 hours after meal is 130-160  Please bring your blood sugar monitor to each visit, thank you   

## 2022-05-18 NOTE — Progress Notes (Signed)
Cardiology Office Note:    Date:  05/31/2022   ID:  [name], DOB 02/26/1950, MRN [id number]  PCP:  Patient, No Pcp Per  [facility] Providers Cardiologist:  Christell Constant, MD     Referring MD: No ref. provider found   Chief Complaint:  Follow-up     History of Present Illness:   [name] is a 72 y.o. male with a hx of DM with HTN, HLD, LBBB .   2022: Two ED visits and has been diagnosed with vertigo.   Had ZioPatch and Echocardiogram that were relatively benign.LDL down to 74.  Patient last saw Dr. Izora Ribas 05/2021 was stable.  Patient comes in for yearly f/u. Denies chest pain, dyspnea, dizziness, palpitations. Gets leg swelling on actos and had to stop. A1C around 7. Dug a fence pole yesterday, yard work, exercise bike 15 min twice a week.  Sisters MI's in there 89's. Mother died MI 56's. Concerned about having CAD.       Past Medical History:  Diagnosis Date   Arthritis    right shoulder   BPH (benign prostatic hyperplasia)    Hyperlipidemia    Hypertension    Impotence of organic origin    Iron deficiency anemia    Mild atherosclerosis of carotid artery, bilateral    last doppler 12-17-2010   bilateral ICA 1-39%   Nocturia more than twice per night    Testicular hypofunction    Type 2 diabetes mellitus [facility])    endocrinologist-- dr Merri Ray urinary stream    Wears glasses    Current Medications: Current Meds  Medication Sig   acyclovir (ZOVIRAX) 800 MG tablet Take 800 mg by mouth daily as needed (for flare-up).   aspirin 81 MG tablet Take 81 mg by mouth daily.   atenolol (TENORMIN) 50 MG tablet Take 1 tablet (50 mg total) by mouth daily.   cetirizine (ZYRTEC) 10 MG tablet Take 10 mg by mouth as needed.   diltiazem (CARDIZEM) 30 MG tablet Take 1 tablet (30 mg total) by mouth 4 (four) times daily as needed (elevated HR).   empagliflozin (JARDIANCE) 25 MG TABS tablet Take 1 tablet (25 mg total) by mouth daily.   FREESTYLE  LITE test strip USE 1 STRIP DAILY TO MONITOR GLUCOSE LEVELS   Lancets (FREESTYLE) lancets 1 each by Other route daily. Use to monitor glucose levels daily; E11.9   losartan (COZAAR) 50 MG tablet    meclizine (ANTIVERT) 12.5 MG tablet Take 1 tablet (12.5 mg total) by mouth 2 (two) times daily as needed for dizziness.   metFORMIN (GLUCOPHAGE-XR) 500 MG 24 hr tablet TAKE 2 TABLETS TWICE A DAY   Multiple Vitamin (MULTIVITAMIN) tablet Take 1 tablet by mouth daily.   olopatadine (PATANOL) 0.1 % ophthalmic solution Apply 1 drop to eye daily as needed (for itchy and dry eyes).   omeprazole (PRILOSEC) 20 MG capsule Take 1 capsule (20 mg total) by mouth 2 (two) times daily before a meal.   rosuvastatin (CRESTOR) 10 MG tablet TAKE 1 TABLET DAILY   RYBELSUS 3 MG TABS TAKE 1 TABLET DAILY 30 MINUTES BEFORE BREAKFAST WITH WATER   sildenafil (VIAGRA) 100 MG tablet Take 100 mg by mouth daily as needed for erectile dysfunction. Use as directed   testosterone cypionate (DEPOTESTOSTERONE CYPIONATE) 200 MG/ML injection Inject 200 mg into the muscle every 14 (fourteen) days.   triamcinolone (KENALOG) 0.1 % Apply 1 application topically at bedtime.    Allergies:   Actos [  pioglitazone], Trandolapril, Azithromycin, Other, and Tetracycline   Social History   Tobacco Use   Smoking status: Never   Smokeless tobacco: Never  Vaping Use   Vaping Use: Never used  Substance Use Topics   Alcohol use: Not Currently    Alcohol/week: 0.0 standard drinks of alcohol   Drug use: No    Family Hx: The patient's family history includes Cancer in his paternal aunt and paternal aunt; Diabetes in his brother; Diabetes (age of onset: 25) in his father; Healthy in his sister; Heart attack in his brother, maternal grandmother, mother, sister, and sister; Heart disease in his sister; Heart disease (age of onset: 35) in his mother; Hypertension in his father and mother. There is no history of Colon cancer, Esophageal cancer, Liver  cancer, Stomach cancer, or Pancreatic cancer.  ROS     Physical Exam:    VS:  BP 110/62   Pulse 71   Ht 5' 6.5" (1.689 m)   Wt 160 lb (72.6 kg)   SpO2 96%   BMI 25.44 kg/m     Wt Readings from Last 3 Encounters:  05/31/22 160 lb (72.6 kg)  05/17/22 163 lb (73.9 kg)  01/13/22 159 lb 12.8 oz (72.5 kg)    Physical Exam  GEN: Well nourished, well developed, in no acute distress  HEENT: normal  Neck: no JVD, carotid bruits, or masses Cardiac:RRR; 2/6 systolic murmur LSB and apex Respiratory:  clear to auscultation bilaterally, normal work of breathing GI: soft, nontender, nondistended, + BS Ext: without cyanosis, clubbing, or edema, Good distal pulses bilaterally Neuro:  Alert and Oriented x 3,  Psych: euthymic mood, full affect        EKGs/Labs/Other Test Reviewed:    EKG:  EKG is   ordered today.  The ekg ordered today demonstrates NSR with LBBB  Recent Labs: 05/13/2022: BUN 12; Creatinine, Ser 0.99; Potassium 3.7; Sodium 139   Recent Lipid Panel No results for input(s): "CHOL", "TRIG", "HDL", "VLDL", "LDLCALC", "LDLDIRECT" in the last 8760 hours.   Prior CV Studies:    Cardiac Event Monitoring: Date: 02/07/20 Results: Patient had a minimum heart rate of 51 bpm, maximum heart rate of 156 bpm, and average heart rate of 74 bpm. Predominant underlying rhythm was sinus rhythm. Twelve runs of supraventricular tachycardia occurred lasting 15 beats at longest with a max rate of 156 bpm at fastest. Isolated PACs were rare (<1.0%), with rare couplets and triplets present. Isolated PVCs were rare (<1.0%), with rare couplets and triplets present. Bundle Branch Block/IVCD was present. No triggered and diary events.   No malignant arrhythmias.     Transthoracic Echocardiogram: Date: 01/22/20 Results: No WMAs, while the is some calcification and annular calcium, PSAX looks OK  1. Left ventricular ejection fraction, by estimation, is 60 to 65%. The  left ventricle has  normal function. The left ventricle has no regional  wall motion abnormalities. There is mild left ventricular hypertrophy.  Left ventricular diastolic parameters  were normal.   2. Right ventricular systolic function is normal. The right ventricular  size is normal.   3. The mitral valve is abnormal. Mild mitral valve regurgitation. No  evidence of mitral stenosis.   4. The aortic valve is tricuspid. There is moderate calcification of the  aortic valve. There is moderate thickening of the aortic valve. Aortic  valve regurgitation is not visualized. Mild to moderate aortic valve  sclerosis/calcification is present,  without any evidence of aortic stenosis.   5. The inferior vena cava  is normal in size with greater than 50%  respiratory variability, suggesting right atrial pressure of 3 mmHg.      Risk Assessment/Calculations/Metrics:              ASSESSMENT & PLAN:   No problem-specific Assessment & Plan notes found for this encounter.   PSVT - asymptomatic  - diltiazem 30 mg PO as a PRN-hasn't used - atenolol 50 mg tolerated well  LBBB   Hypertension with Diabetes-BP well controlled.  History of chest pain-asymptomatic, given his prior CP and strong family history of CAD(sister just died MI) will order coronary CTA.   Aortic Sclerosis without stenosis Mitral regurgitation echo 01/2020 - repeat echo in 2025  Hyperlipidemia (mixed) - ASA reasonable, with no sx and in the setting of DM -LDL goal less than 100-needs repeat FLP -continue current statin              Dispo:  No follow-ups on file.   Medication Adjustments/Labs and Tests Ordered: Current medicines are reviewed at length with the patient today.  Concerns regarding medicines are outlined above.  Tests Ordered: Orders Placed This Encounter  Procedures   CT CORONARY MORPH W/CTA COR W/SCORE W/CA W/CM &/OR WO/CM   Lipid panel   EKG 12-Lead   Medication Changes: No orders of the defined types were  placed in this encounter.  Elson Clan, PA-C  05/31/2022 10:05 AM    [facility] [address] [place], [place], [place]  [address] Phone: [phone]; Fax: [phone]

## 2022-05-31 ENCOUNTER — Encounter: Payer: Self-pay | Admitting: Physician Assistant

## 2022-05-31 ENCOUNTER — Ambulatory Visit: Payer: Medicare Other | Attending: Physician Assistant | Admitting: Physician Assistant

## 2022-05-31 VITALS — BP 110/62 | HR 71 | Ht 66.5 in | Wt 160.0 lb

## 2022-05-31 DIAGNOSIS — I358 Other nonrheumatic aortic valve disorders: Secondary | ICD-10-CM | POA: Insufficient documentation

## 2022-05-31 DIAGNOSIS — Z87898 Personal history of other specified conditions: Secondary | ICD-10-CM | POA: Diagnosis present

## 2022-05-31 DIAGNOSIS — I471 Supraventricular tachycardia, unspecified: Secondary | ICD-10-CM | POA: Insufficient documentation

## 2022-05-31 DIAGNOSIS — E1159 Type 2 diabetes mellitus with other circulatory complications: Secondary | ICD-10-CM | POA: Diagnosis present

## 2022-05-31 DIAGNOSIS — R079 Chest pain, unspecified: Secondary | ICD-10-CM | POA: Insufficient documentation

## 2022-05-31 DIAGNOSIS — I152 Hypertension secondary to endocrine disorders: Secondary | ICD-10-CM | POA: Diagnosis present

## 2022-05-31 DIAGNOSIS — Z7984 Long term (current) use of oral hypoglycemic drugs: Secondary | ICD-10-CM

## 2022-05-31 DIAGNOSIS — I447 Left bundle-branch block, unspecified: Secondary | ICD-10-CM | POA: Diagnosis present

## 2022-05-31 DIAGNOSIS — E1169 Type 2 diabetes mellitus with other specified complication: Secondary | ICD-10-CM | POA: Diagnosis present

## 2022-05-31 DIAGNOSIS — I2510 Atherosclerotic heart disease of native coronary artery without angina pectoris: Secondary | ICD-10-CM | POA: Insufficient documentation

## 2022-05-31 DIAGNOSIS — E785 Hyperlipidemia, unspecified: Secondary | ICD-10-CM | POA: Insufficient documentation

## 2022-05-31 NOTE — Patient Instructions (Addendum)
Medication Instructions:  Your physician recommends that you continue on your current medications as directed. Please refer to the Current Medication list given to you today.  *If you need a refill on your cardiac medications before your next appointment, please call your pharmacy*   Lab Work: TO BE DONE THE SAME TIME AS CORONARY CTA: FASTING LIPIDS  If you have labs (blood work) drawn today and your tests are completely normal, you will receive your results only by: MyChart Message (if you have MyChart) OR A paper copy in the mail If you have any lab test that is abnormal or we need to change your treatment, we will call you to review the results.   Testing/Procedures:   Your cardiac CT will be scheduled at one of the below locations:   [facility] [address] [place], [place] [address] [phone]  If scheduled at [facility], please arrive at the [facility] and Children's Entrance (Entrance C2) of [facility] 30 minutes prior to test start time. You can use the FREE valet parking offered at entrance C (encouraged to control the heart rate for the test)  Proceed to the [facility] Radiology Department (first floor) to check-in and test prep.  All radiology patients and guests should use entrance C2 at [facility], accessed from [facility], even though the hospital's physical address listed is [address].    If scheduled at [facility] or [facility], please arrive 15 mins early for check-in and test prep.   Please follow these instructions carefully (unless otherwise directed):  Hold all erectile dysfunction medications at least 3 days (72 hrs) prior to test. (Ie viagra, cialis, sildenafil, tadalafil, etc) We will administer nitroglycerin during this exam.   On the Night Before the Test: Be sure to Drink plenty of water. Do not consume any caffeinated/decaffeinated  beverages or chocolate 12 hours prior to your test. Do not take any antihistamines 12 hours prior to your test.  On the Day of the Test: Drink plenty of water until 1 hour prior to the test. Do not eat any food 1 hour prior to test. You may take your regular medications prior to the test.  Take metoprolol (Lopressor) two hours prior to test. If you take Furosemide/Hydrochlorothiazide/Spironolactone, please HOLD on the morning of the test.      After the Test: Drink plenty of water. After receiving IV contrast, you may experience a mild flushed feeling. This is normal. On occasion, you may experience a mild rash up to 24 hours after the test. This is not dangerous. If this occurs, you can take Benadryl 25 mg and increase your fluid intake. If you experience trouble breathing, this can be serious. If it is severe call 911 IMMEDIATELY. If it is mild, please call our office. If you take any of these medications: Glipizide/Metformin, Avandament, Glucavance, please do not take 48 hours after completing test unless otherwise instructed.  We will call to schedule your test 2-4 weeks out understanding that some insurance companies will need an authorization prior to the service being performed.   For non-scheduling related questions, please contact the cardiac imaging nurse navigator should you have any questions/concerns: Rockwell Alexandria, Cardiac Imaging Nurse Navigator Larey Brick, Cardiac Imaging Nurse Navigator [facility] Direct Office Dial: [phone]   For scheduling needs, including cancellations and rescheduling, please call [place], [phone].    Follow-Up: At [facility], you and  your health needs are our priority.  As part of our continuing mission to provide you with exceptional heart care, we have created designated Provider Care Teams.  These Care Teams include your primary Cardiologist (physician) and Advanced Practice Providers (APPs -   Physician Assistants and Nurse Practitioners) who all work together to provide you with the care you need, when you need it.  We recommend signing up for the patient portal called "MyChart".  Sign up information is provided on this After Visit Summary.  MyChart is used to connect with patients for Virtual Visits (Telemedicine).  Patients are able to view lab/test results, encounter notes, upcoming appointments, etc.  Non-urgent messages can be sent to your provider as well.   To learn more about what you can do with MyChart, go to [email or web address].    Your next appointment:   1 year(s)  Provider:   Christell Constant, MD

## 2022-06-08 ENCOUNTER — Telehealth (HOSPITAL_COMMUNITY): Payer: Self-pay | Admitting: *Deleted

## 2022-06-08 NOTE — Telephone Encounter (Signed)
Reaching out to patient to offer assistance regarding upcoming cardiac imaging study; pt verbalizes understanding of appt date/time, parking situation and where to check in, pre-test NPO status and medications ordered, and verified current allergies; name and call back number provided for further questions should they arise  Larey Brick RN Navigator Cardiac Imaging [facility] [phone] office [phone] cell  Patient to take his daily dose of atenolol two hours prior.  He is aware to arrive at 7:30am.

## 2022-06-09 ENCOUNTER — Ambulatory Visit (HOSPITAL_BASED_OUTPATIENT_CLINIC_OR_DEPARTMENT_OTHER)
Admission: RE | Admit: 2022-06-09 | Discharge: 2022-06-09 | Disposition: A | Discharge status: Home / Self Care | Payer: Medicare Other | Source: Ambulatory Visit | Attending: Cardiology | Admitting: Cardiology

## 2022-06-09 ENCOUNTER — Ambulatory Visit (HOSPITAL_COMMUNITY)
Admission: RE | Admit: 2022-06-09 | Discharge: 2022-06-09 | Disposition: A | Discharge status: Home / Self Care | Payer: Medicare Other | Source: Ambulatory Visit | Attending: Physician Assistant | Admitting: Physician Assistant

## 2022-06-09 ENCOUNTER — Other Ambulatory Visit: Payer: Self-pay | Admitting: Cardiology

## 2022-06-09 DIAGNOSIS — I2510 Atherosclerotic heart disease of native coronary artery without angina pectoris: Secondary | ICD-10-CM

## 2022-06-09 DIAGNOSIS — R931 Abnormal findings on diagnostic imaging of heart and coronary circulation: Secondary | ICD-10-CM

## 2022-06-09 DIAGNOSIS — Z87898 Personal history of other specified conditions: Secondary | ICD-10-CM | POA: Insufficient documentation

## 2022-06-09 DIAGNOSIS — R079 Chest pain, unspecified: Secondary | ICD-10-CM | POA: Insufficient documentation

## 2022-06-09 MED ORDER — NITROGLYCERIN 0.4 MG SL SUBL
SUBLINGUAL_TABLET | SUBLINGUAL | Status: AC
  Filled 2022-06-09: qty 2

## 2022-06-09 MED ORDER — IOHEXOL 350 MG/ML SOLN
95.0000 mL | Freq: Once | INTRAVENOUS | Status: AC | PRN
  Administered 2022-06-09: 95 mL via INTRAVENOUS

## 2022-06-09 MED ORDER — NITROGLYCERIN 0.4 MG SL SUBL
0.8000 mg | SUBLINGUAL_TABLET | Freq: Once | SUBLINGUAL | Status: AC
  Administered 2022-06-09: 0.8 mg via SUBLINGUAL

## 2022-06-09 NOTE — Progress Notes (Signed)
Pre-procedure vitals: 0740 HR 61 BP 108/69  IV started, 20g left AC, pt tolerated well.  Post-procedure vitals: 0823 HR 64 BP 126/69  IV removed, bleeding controlled, catheter intact, pt tolerated procedure well.  No new complaints, NAD.  Pt escorted to parking entrance via wheelchair.

## 2022-06-10 ENCOUNTER — Telehealth: Payer: Self-pay | Admitting: *Deleted

## 2022-06-10 DIAGNOSIS — Z79899 Other long term (current) drug therapy: Secondary | ICD-10-CM

## 2022-06-10 DIAGNOSIS — E1169 Type 2 diabetes mellitus with other specified complication: Secondary | ICD-10-CM

## 2022-06-10 DIAGNOSIS — E1159 Type 2 diabetes mellitus with other circulatory complications: Secondary | ICD-10-CM

## 2022-06-10 DIAGNOSIS — I2510 Atherosclerotic heart disease of native coronary artery without angina pectoris: Secondary | ICD-10-CM

## 2022-06-10 NOTE — Telephone Encounter (Signed)
-----   Message from Dyann Kief, PA-C sent at 06/10/2022  7:36 AM EDT ----- Coronary CTA shows moderated plaque in 2 arteries. We sent this off for FFR measurement which shows no significant stenosis. I ordered a lipid panel but don't see it. Being a diabetic his LDL goal is now less than 55. Please make sure FLP was done.   There will be a radiologist doing an over read of the CT to look at his lungs etc. It can take 1-2 weeks to get back. If it's normal we will not contact the patient. Please let him know he can view it on mychart. thanks

## 2022-06-10 NOTE — Telephone Encounter (Signed)
The patient has been notified of the result and verbalized understanding.  All questions (if any) were answered.  Pt is aware that he will need to come in soon to have his lipids checked, for we need to reassess his LDL level, with a goal of 55 and lower, to ensure in the future he doesn't throw down any additional plaque in those heart arteries.   Scheduled the pt for a lab appt to check lipids for next Monday 6/10, here at our office.  He is aware to come fasting to this lab appt.   Pt also aware that the Radiologist still needs to read the non-cardiac portion of this test.  Pt aware he will only get a call back from our office if the Radiologist notes an incidental finding on the CT results.   Advised him that the Radiologist is reading these about 1-2 weeks out.   He is aware he will receive the report via mychart as well.  Pt verbalized understanding and agrees with this plan.

## 2022-06-13 ENCOUNTER — Ambulatory Visit: Payer: Medicare Other | Attending: Physician Assistant

## 2022-06-13 DIAGNOSIS — I2510 Atherosclerotic heart disease of native coronary artery without angina pectoris: Secondary | ICD-10-CM

## 2022-06-13 DIAGNOSIS — E1169 Type 2 diabetes mellitus with other specified complication: Secondary | ICD-10-CM

## 2022-06-13 DIAGNOSIS — Z79899 Other long term (current) drug therapy: Secondary | ICD-10-CM

## 2022-06-13 DIAGNOSIS — E1159 Type 2 diabetes mellitus with other circulatory complications: Secondary | ICD-10-CM

## 2022-06-14 ENCOUNTER — Telehealth: Payer: Self-pay

## 2022-06-14 DIAGNOSIS — E1159 Type 2 diabetes mellitus with other circulatory complications: Secondary | ICD-10-CM

## 2022-06-14 LAB — LIPID PANEL
Chol/HDL Ratio: 3 ratio (ref 0.0–5.0)
Cholesterol, Total: 148 mg/dL (ref 100–199)
HDL: 49 mg/dL (ref >39)
LDL Chol Calc (NIH): 85 mg/dL (ref 0–99)
Triglycerides: 73 mg/dL (ref 0–149)
VLDL Cholesterol Cal: 14 mg/dL (ref 5–40)

## 2022-06-14 MED ORDER — ROSUVASTATIN CALCIUM 20 MG PO TABS
20.0000 mg | ORAL_TABLET | Freq: Every day | ORAL | 3 refills | Status: DC
Start: 2022-06-14 — End: 2022-06-28

## 2022-06-14 MED ORDER — EZETIMIBE 10 MG PO TABS: 10.0000 mg | ORAL_TABLET | Freq: Every day | ORAL | 3 refills | Status: DC

## 2022-06-14 NOTE — Telephone Encounter (Signed)
-----   Message from Dyann Kief, PA-C sent at 06/14/2022  8:00 AM EDT ----- LDL above goal of less than 55. Please increase crestor 20 mg daily and add zetia 10 mg daily. Repeat FLP in 3 months thanks

## 2022-06-14 NOTE — Telephone Encounter (Signed)
Pt returning call

## 2022-06-14 NOTE — Telephone Encounter (Signed)
Left voicemail to return call to office.

## 2022-06-14 NOTE — Telephone Encounter (Signed)
Spoke with patient and gave information regarding increase of Crestor and new medication Zetia.  He states understanding.  Zetia called to pharmacy he requested.  Crestor changed but not sent as patient states he has a large quantity of the 10mg  and will take two until refill is needed.

## 2022-06-17 LAB — HM DIABETES EYE EXAM

## 2022-06-19 ENCOUNTER — Other Ambulatory Visit: Payer: Self-pay | Admitting: Endocrinology

## 2022-06-28 ENCOUNTER — Ambulatory Visit (INDEPENDENT_AMBULATORY_CARE_PROVIDER_SITE_OTHER): Payer: Medicare Other | Admitting: Podiatry

## 2022-06-28 ENCOUNTER — Ambulatory Visit (INDEPENDENT_AMBULATORY_CARE_PROVIDER_SITE_OTHER): Payer: Medicare Other

## 2022-06-28 ENCOUNTER — Telehealth: Payer: Self-pay

## 2022-06-28 DIAGNOSIS — E119 Type 2 diabetes mellitus without complications: Secondary | ICD-10-CM | POA: Diagnosis not present

## 2022-06-28 DIAGNOSIS — M722 Plantar fascial fibromatosis: Secondary | ICD-10-CM

## 2022-06-28 DIAGNOSIS — M2142 Flat foot [pes planus] (acquired), left foot: Secondary | ICD-10-CM

## 2022-06-28 DIAGNOSIS — M2141 Flat foot [pes planus] (acquired), right foot: Secondary | ICD-10-CM

## 2022-06-28 DIAGNOSIS — E1159 Type 2 diabetes mellitus with other circulatory complications: Secondary | ICD-10-CM

## 2022-06-28 MED ORDER — ROSUVASTATIN CALCIUM 20 MG PO TABS
20.0000 mg | ORAL_TABLET | Freq: Every day | ORAL | 1 refills | Status: DC
Start: 2022-06-28 — End: 2022-06-28

## 2022-06-28 MED ORDER — MELOXICAM 15 MG PO TABS: 15.0000 mg | ORAL_TABLET | Freq: Every day | ORAL | 0 refills | Status: AC | PRN

## 2022-06-28 MED ORDER — ROSUVASTATIN CALCIUM 20 MG PO TABS
20.0000 mg | ORAL_TABLET | Freq: Every day | ORAL | 1 refills | Status: DC
Start: 2022-06-28 — End: 2023-02-07

## 2022-06-28 NOTE — Telephone Encounter (Signed)
Received a voicemail from the patient stating [name] increase his Crestor to 20mg  but when he went to the pharmacy they did not have it. He is requesting a new rx be sent to the pharmacy. He states his pharmacy has only gave him the 10mg  tablets and he is supposed to take the 20mg  tabs.

## 2022-06-28 NOTE — Telephone Encounter (Signed)
Rx(s) sent to pharmacy electronically.  Patient notified directly and voiced understanding.  

## 2022-06-28 NOTE — Patient Instructions (Signed)

## 2022-06-28 NOTE — Progress Notes (Signed)
Subjective:   Patient ID: [name], male   DOB: 72 y.o.   MRN: [id number]   HPI Chief Complaint  Patient presents with   Foot Pain    Bilateral foot pain starting at the bottom of both feet , on going for awhile patient states he is a diabetic with a A1C of 7.0 as of 05/13/22     72 year old male presents the office with above concerns.  Started to walk and after 4 loops on the track he gets pain to the whole bottom of the feet. No treatment. When he stops walking he feels fine. He had some cushion for his shoes a few years ago and he is not sure if they are worn out. He had some puffiness last night. He thought it was his medication that was causing a little swelling. No numbness, tingling, burning.  He does get a "itching" sensation which he thinks may be related with diabetes.  No open sores.      Last A1c was 7 on 5/102024   Review of Systems  All other systems reviewed and are negative.   Past Medical History:  Diagnosis Date   Arthritis    right shoulder   BPH (benign prostatic hyperplasia)    Hyperlipidemia    Hypertension    Impotence of organic origin    Iron deficiency anemia    Mild atherosclerosis of carotid artery, bilateral    last doppler 12-17-2010   bilateral ICA 1-39%   Nocturia more than twice per night    Testicular hypofunction    Type 2 diabetes mellitus [facility])    endocrinologist-- dr Merri Ray urinary stream    Wears glasses     Past Surgical History:  Procedure Laterality Date   CYSTOSCOPY WITH INSERTION OF UROLIFT N/A 12/12/2017   Procedure: CYSTOSCOPY WITH INSERTION OF UROLIFT;  Surgeon: Jerilee Field, MD;  Location: [facility];  Service: Urology;  Laterality: N/A;   SHOULDER SURGERY Right 2004, 1990   WISDOM TOOTH EXTRACTION       Current Outpatient Medications:    meloxicam (MOBIC) 15 MG tablet, Take 1 tablet (15 mg total) by mouth daily as needed for pain., Disp: 30 tablet, Rfl: 0   acyclovir (ZOVIRAX) 800  MG tablet, Take 800 mg by mouth daily as needed (for flare-up)., Disp: , Rfl:    aspirin 81 MG tablet, Take 81 mg by mouth daily., Disp: , Rfl:    atenolol (TENORMIN) 50 MG tablet, Take 1 tablet (50 mg total) by mouth daily., Disp: 30 tablet, Rfl: 0   cetirizine (ZYRTEC) 10 MG tablet, Take 10 mg by mouth as needed., Disp: , Rfl:    diltiazem (CARDIZEM) 30 MG tablet, Take 1 tablet (30 mg total) by mouth 4 (four) times daily as needed (elevated HR)., Disp: 10 tablet, Rfl: 1   empagliflozin (JARDIANCE) 25 MG TABS tablet, Take 1 tablet (25 mg total) by mouth daily., Disp: 90 tablet, Rfl: 3   ezetimibe (ZETIA) 10 MG tablet, Take 1 tablet (10 mg total) by mouth daily., Disp: 90 tablet, Rfl: 3   FREESTYLE LITE test strip, USE 1 STRIP DAILY TO MONITOR GLUCOSE LEVELS, Disp: 100 strip, Rfl: 3   Lancets (FREESTYLE) lancets, 1 each by Other route daily. Use to monitor glucose levels daily; E11.9, Disp: 100 each, Rfl: 2   losartan (COZAAR) 50 MG tablet, , Disp: , Rfl:    meclizine (ANTIVERT) 12.5 MG tablet, Take 1 tablet (12.5 mg total) by mouth 2 (  two) times daily as needed for dizziness., Disp: 10 tablet, Rfl: 0   metFORMIN (GLUCOPHAGE-XR) 500 MG 24 hr tablet, TAKE 2 TABLETS BY MOUTH TWICE DAILY, Disp: 360 tablet, Rfl: 0   Multiple Vitamin (MULTIVITAMIN) tablet, Take 1 tablet by mouth daily., Disp: , Rfl:    olopatadine (PATANOL) 0.1 % ophthalmic solution, Apply 1 drop to eye daily as needed (for itchy and dry eyes)., Disp: , Rfl:    omeprazole (PRILOSEC) 20 MG capsule, Take 1 capsule (20 mg total) by mouth 2 (two) times daily before a meal., Disp: 60 capsule, Rfl: 3   pioglitazone (ACTOS) 15 MG tablet, Take 2 tablets (30 mg total) by mouth daily. (Patient not taking: Reported on 05/31/2022), Disp: 180 tablet, Rfl: 3   rosuvastatin (CRESTOR) 20 MG tablet, Take 1 tablet (20 mg total) by mouth daily., Disp: 90 tablet, Rfl: 3   RYBELSUS 3 MG TABS, TAKE 1 TABLET DAILY 30 MINUTES BEFORE BREAKFAST WITH WATER, Disp:  90 tablet, Rfl: 3   sildenafil (VIAGRA) 100 MG tablet, Take 100 mg by mouth daily as needed for erectile dysfunction. Use as directed, Disp: , Rfl:    testosterone cypionate (DEPOTESTOSTERONE CYPIONATE) 200 MG/ML injection, Inject 200 mg into the muscle every 14 (fourteen) days., Disp: , Rfl:    triamcinolone (KENALOG) 0.1 %, Apply 1 application topically at bedtime., Disp: , Rfl:   Allergies  Allergen Reactions   Actos [Pioglitazone] Swelling    edema   Trandolapril Swelling     w/ Angioedema   Azithromycin Rash   Other Rash    MAGIC MOUTHWASH   Tetracycline Rash         Objective:  Physical Exam  General: AAO x3, NAD  Dermatological: Skin is warm, dry and supple bilateral.  There are no open sores, no preulcerative lesions, no rash or signs of infection present.  No open lesions.  There is no tinea pedis or maceration.    Vascular: Dorsalis Pedis artery and Posterior Tibial artery pedal pulses are 2/4 bilateral with immedate capillary fill time. . There is no pain with calf compression, swelling, warmth, erythema.   Neruologic: Grossly intact via light touch bilateral.  Sensation appears to be intact today.  Musculoskeletal: Decreased medial arch upon weightbearing bilaterally.  He does get some discomfort in the medial arch of the foot plantarly on both sides.  No significant pain on the insertion of the calcaneus.  Bunions are present.  No area of pinpoint tenderness.  Unable to appreciate significant edema today and there is no erythema or warmth.  MMT 5/5.  Gait: Unassisted, Nonantalgic.       Assessment:   72 year old male with plantar fascia, flatfoot     Plan:  -Treatment options discussed including all alternatives, risks, and complications -Etiology of symptoms were discussed -X-rays were obtained and reviewed with the patient.  3 views of the feet were obtained bilaterally.  No evidence of acute fracture.  Bunion present.  Joint space maintained. -His  orthotics are several years old.  He was measured for new orthotics today.  I did add a metatarsal pad to help increase the arch and is trying orthotics for now.  We discussed stretching, icing on a regular basis I prescribed Amoxil can take as needed.  Vivi Barrack DPM

## 2022-07-14 ENCOUNTER — Telehealth: Payer: Self-pay | Admitting: Internal Medicine

## 2022-07-14 ENCOUNTER — Other Ambulatory Visit: Payer: Self-pay | Admitting: Podiatry

## 2022-07-14 ENCOUNTER — Telehealth: Payer: Self-pay | Admitting: Podiatry

## 2022-07-14 DIAGNOSIS — E1149 Type 2 diabetes mellitus with other diabetic neurological complication: Secondary | ICD-10-CM

## 2022-07-14 DIAGNOSIS — M2141 Flat foot [pes planus] (acquired), right foot: Secondary | ICD-10-CM

## 2022-07-14 NOTE — Telephone Encounter (Signed)
Called pt to inform him that he needed to give his pharmacy a call, because he has refills at his pharmacy and that he would probably have to pay out of pocket to get more medication. I inform pt that we do not have samples of this medication in the office. I advised the pt that if he has any other problems, questions or concerns, to give our office a call back. Pt verbalized understanding.

## 2022-07-14 NOTE — Telephone Encounter (Signed)
Called pt to get him scheduled to pick up his orthotics and he asked if he had to pay anything and he did have a balance on 490.00 as he has medicare as primary and they do not cover orthotics.  He said he was told when he was measured that someone would call him to let him know if he would have to pay. He stated he does not have the 490.00 to pay for them.I did explain he could make payments.  He asked if medicare covers anything and I explained they cover diabetic shoes and inserts and he stated he was a diabetic.  How would you like me to proceed? We have the orthotics in the office.

## 2022-07-14 NOTE — Telephone Encounter (Signed)
Pt c/o medication issue:  1. Name of Medication:   rosuvastatin (CRESTOR) 20 MG tablet    2. How are you currently taking this medication (dosage and times per day)?   Take 1 tablet (20 mg total) by mouth daily.    3. Are you having a reaction (difficulty breathing--STAT)? No  4. What is your medication issue? Pt states he left his medication in [place] and needs to figure out how to get a prescription because it was just filled for 90 days. Please advise.

## 2022-07-20 NOTE — Telephone Encounter (Signed)
Pt called checking to see if insurance paid for his inserts.  I told pt I had left a message that we are going to order him diabetic shoes and have sent the paperwork to Dr Lucianne Muss on 7.12. He told me Dr Lucianne Muss is retiring in August. I explained once we get the paperwork back we will get pt scheduled to come in to pick out diabetic shoes.He said ok and Thank You.

## 2022-07-25 ENCOUNTER — Telehealth: Payer: Self-pay

## 2022-07-25 NOTE — Telephone Encounter (Signed)
Patient came in office this morning concerned as to why he was denied his orthotics. He's been diabetic since 1999 and has always received orthotics until now.  Patient stated that he is maintaining his diabetes with exercise and in order to do that he needs good foot support via orthotics. Is there a way we can revisit this issue. Last foot exam was 05/2022. Please advise.

## 2022-08-09 ENCOUNTER — Ambulatory Visit (INDEPENDENT_AMBULATORY_CARE_PROVIDER_SITE_OTHER): Payer: Medicare Other | Admitting: Podiatry

## 2022-08-09 DIAGNOSIS — M2142 Flat foot [pes planus] (acquired), left foot: Secondary | ICD-10-CM

## 2022-08-09 DIAGNOSIS — M722 Plantar fascial fibromatosis: Secondary | ICD-10-CM | POA: Diagnosis not present

## 2022-08-09 DIAGNOSIS — M2141 Flat foot [pes planus] (acquired), right foot: Secondary | ICD-10-CM | POA: Diagnosis not present

## 2022-08-09 NOTE — Progress Notes (Signed)
Subjective:   Patient ID: [name], male   DOB: 72 y.o.   MRN: [id number]   HPI No chief complaint on file.   72 year old male presents the office with above concerns.  States he is still getting pain to the arch of his feet.  He is on his feet yesterday by me concerned sooner but still hurting.  Then had added did help some.  He is not due to discomfort when he wears his oofos.    Last A1c was 7 on 5/102024   Review of Systems  All other systems reviewed and are negative.      Objective:  Physical Exam  General: AAO x3, NAD  Dermatological: Skin is warm, dry and supple bilateral.  There are no open sores, no preulcerative lesions, no rash or signs of infection present.  No open lesions.  There is no tinea pedis or maceration.    Vascular: Dorsalis Pedis artery and Posterior Tibial artery pedal pulses are 2/4 bilateral with immedate capillary fill time. There is no pain with calf compression, swelling, warmth, erythema.   Neruologic: Grossly intact via light touch bilateral.  Sensation appears to be intact today.  Musculoskeletal: Decreased medial arch upon weightbearing bilaterally.  Discomfort still along the medial band of plantar fascia in the arch of the foot bilaterally.  There is no area pinpoint tenderness.  No edema, erythema.  Posterior, extensor tendons are intact.  MMT/5.   Gait: Unassisted, Nonantalgic.       Assessment:   72 year old male with plantar fascia, flatfoot     Plan:  -Treatment options discussed including all alternatives, risks, and complications -Etiology of symptoms were discussed -He does not have his orthotics with him.  He will drop them off tomorrow he will modify them to increase the arch.  We discussed stretching, ice on a regular basis.  Offered steroid injection today but he decided to hold off as it is not that bad right now.   No follow-ups on file.  Vivi Barrack DPM

## 2022-08-09 NOTE — Patient Instructions (Signed)

## 2022-08-16 ENCOUNTER — Telehealth: Payer: Self-pay | Admitting: Student

## 2022-08-16 NOTE — Telephone Encounter (Signed)
Left message on vm for patient to call back about orthotics.

## 2022-08-17 ENCOUNTER — Other Ambulatory Visit: Payer: Self-pay | Admitting: Endocrinology

## 2022-09-01 ENCOUNTER — Other Ambulatory Visit (INDEPENDENT_AMBULATORY_CARE_PROVIDER_SITE_OTHER): Payer: Medicare Other

## 2022-09-01 DIAGNOSIS — E119 Type 2 diabetes mellitus without complications: Secondary | ICD-10-CM

## 2022-09-01 LAB — BASIC METABOLIC PANEL WITH GFR
BUN: 15 mg/dL (ref 6–23)
CO2: 24 meq/L (ref 19–32)
Calcium: 9.1 mg/dL (ref 8.4–10.5)
Chloride: 104 meq/L (ref 96–112)
Creatinine, Ser: 0.98 mg/dL (ref 0.40–1.50)
GFR: 77.91 mL/min
Glucose, Bld: 115 mg/dL — ABNORMAL HIGH (ref 70–99)
Potassium: 4.1 meq/L (ref 3.5–5.1)
Sodium: 139 meq/L (ref 135–145)

## 2022-09-01 LAB — HEMOGLOBIN A1C: Hgb A1c MFr Bld: 7.6 % — ABNORMAL HIGH (ref 4.6–6.5)

## 2022-09-07 ENCOUNTER — Encounter: Payer: Self-pay | Admitting: Endocrinology

## 2022-09-07 ENCOUNTER — Ambulatory Visit (INDEPENDENT_AMBULATORY_CARE_PROVIDER_SITE_OTHER): Payer: Medicare Other | Admitting: Endocrinology

## 2022-09-07 VITALS — BP 140/60 | HR 66 | Ht 66.5 in | Wt 158.4 lb

## 2022-09-07 DIAGNOSIS — Z7984 Long term (current) use of oral hypoglycemic drugs: Secondary | ICD-10-CM

## 2022-09-07 DIAGNOSIS — E1140 Type 2 diabetes mellitus with diabetic neuropathy, unspecified: Secondary | ICD-10-CM | POA: Diagnosis not present

## 2022-09-07 DIAGNOSIS — E1165 Type 2 diabetes mellitus with hyperglycemia: Secondary | ICD-10-CM

## 2022-09-07 NOTE — Addendum Note (Signed)
Addended by: Thailan Sava, [place] on: 09/07/2022 12:05 PM   Modules accepted: Orders

## 2022-09-07 NOTE — Patient Instructions (Signed)
No change on diabetes medications.  Check glucose fasting in the morning daily and at bedtime, few times a week.   Try best on diet and exercise.

## 2022-09-07 NOTE — Progress Notes (Signed)
Outpatient Endocrinology Note Harold Brendan Gadson, MD  09/07/22  Patient's Name: [name]    DOB: 1950-05-19    MRN: [id number]                                                    REASON OF VISIT: Follow up for type 2 diabetes mellitus  PCP: Raymon Mutton., FNP  HISTORY OF PRESENT ILLNESS:   [name] is a 72 y.o. old male with past medical history listed below, is here for follow up of type 2 diabetes mellitus.   Pertinent Diabetes History: Patient was diagnosed with type 2 diabetes mellitus in 1996.  He has been on different antidiabetic medications including metformin, acarbose, Prandin, Invokana at different times in the past.  He has relatively controlled diabetes mellitus with hemoglobin A1c mostly in the range of 7s%.  Chronic Diabetes Complications : Retinopathy: no, following with ophthalmology. Last ophthalmology exam was done on 05/2022. Nephropathy: no Peripheral neuropathy: yes, following with podiatry.  Coronary artery disease: no Stroke: no  Relevant comorbidities and cardiovascular risk factors: Obesity: no Body mass index is 25.18 kg/m.  Hypertension: yes Hyperlipidemia. Yes, on statin  Current / Home Diabetic regimen includes: Rybelsus 3 mg daily. Jardiance 25 mg daily. Metformin XR 1000 mg two times a day.  Prior diabetic medications: Actos : edema Rybelsus : lost too much weight on Rybelsus 7 mg.   Glycemic data:   Not able to download and review the glucometer data in the clinic today.  Hypoglycemia: Patient has no hypoglycemic episodes. Patient has hypoglycemia awareness.  Factors modifying glucose control: 1.  Diabetic diet assessment: 3 meals a day.  2.  Staying active or exercising: Limited exercise due to feet pain.  3.  Medication compliance: compliant all of the time.  # Hypogonadism on testosterone supplement topical, managed by primary care provider.  Interval history 09/07/22 Patient glucometer not able to download in the  clinic today to review.  His recent hemoglobin A1c 7.6% slightly worsening.  Patient complains of teeth pain and not able to exercise, numbness and tingling of the feet.  He reports he used to get insert for the shoes, no more getting it. No vision problem. No other complaints.   REVIEW OF SYSTEMS As per history of present illness.   PAST MEDICAL HISTORY: Past Medical History:  Diagnosis Date   Arthritis    right shoulder   BPH (benign prostatic hyperplasia)    Hyperlipidemia    Hypertension    Impotence of organic origin    Iron deficiency anemia    Mild atherosclerosis of carotid artery, bilateral    last doppler 12-17-2010   bilateral ICA 1-39%   Nocturia more than twice per night    Testicular hypofunction    Type 2 diabetes mellitus [facility])    endocrinologist-- dr Merri Ray urinary stream    Wears glasses     PAST SURGICAL HISTORY: Past Surgical History:  Procedure Laterality Date   CYSTOSCOPY WITH INSERTION OF UROLIFT N/A 12/12/2017   Procedure: CYSTOSCOPY WITH INSERTION OF UROLIFT;  Surgeon: Jerilee Field, MD;  Location: [facility];  Service: Urology;  Laterality: N/A;   SHOULDER SURGERY Right 2004, 1990   WISDOM TOOTH EXTRACTION      ALLERGIES: Allergies  Allergen Reactions   Actos [Pioglitazone] Swelling  edema   Trandolapril Swelling     w/ Angioedema   Azithromycin Rash   Other Rash    MAGIC MOUTHWASH   Tetracycline Rash    FAMILY HISTORY:  Family History  Problem Relation Age of Onset   Heart disease Mother 25       Deceased   Heart attack Mother    Hypertension Mother    Diabetes Father 28       Deceased   Hypertension Father    Heart disease Sister        heart trouble   Heart attack Sister    Healthy Sister        x6   Heart attack Sister    Diabetes Brother    Heart attack Brother    Heart attack Maternal Grandmother    Cancer Paternal Aunt        x1-Bone   Cancer Paternal Aunt        multiple   Colon  cancer Neg Hx    Esophageal cancer Neg Hx    Liver cancer Neg Hx    Stomach cancer Neg Hx    Pancreatic cancer Neg Hx     SOCIAL HISTORY: Social History   Socioeconomic History   Marital status: Married    Spouse name: [name]   Number of children: 0   Years of education: Not on file   Highest education level: Not on file  Occupational History    Employer: [employer]  Tobacco Use   Smoking status: Never   Smokeless tobacco: Never  Vaping Use   Vaping status: Never Used  Substance and Sexual Activity   Alcohol use: Not Currently    Alcohol/week: 0.0 standard drinks of alcohol   Drug use: No   Sexual activity: Yes  Other Topics Concern   Not on file  Social History Narrative   Regular exercise-yes   Caffeine-2 cups   Married=wife [name] 22 years   Cigar Smoker-quit smoking Jan 2010   Social Determinants of Health   Financial Resource Strain: Not on file  Food Insecurity: Not on file  Transportation Needs: Not on file  Physical Activity: Not on file  Stress: Not on file  Social Connections: Not on file    MEDICATIONS:  Current Outpatient Medications  Medication Sig Dispense Refill   acyclovir (ZOVIRAX) 800 MG tablet Take 800 mg by mouth daily as needed (for flare-up).     aspirin 81 MG tablet Take 81 mg by mouth daily.     atenolol (TENORMIN) 50 MG tablet Take 1 tablet (50 mg total) by mouth daily. 30 tablet 0   cetirizine (ZYRTEC) 10 MG tablet Take 10 mg by mouth as needed.     empagliflozin (JARDIANCE) 25 MG TABS tablet Take 1 tablet (25 mg total) by mouth daily. 90 tablet 3   ezetimibe (ZETIA) 10 MG tablet Take 1 tablet (10 mg total) by mouth daily. 90 tablet 3   losartan (COZAAR) 50 MG tablet      meloxicam (MOBIC) 15 MG tablet Take 1 tablet (15 mg total) by mouth daily as needed for pain. 30 tablet 0   metFORMIN (GLUCOPHAGE-XR) 500 MG 24 hr tablet TAKE 2 TABLETS BY MOUTH TWICE DAILY 360 tablet 0   Multiple Vitamin (MULTIVITAMIN) tablet Take 1  tablet by mouth daily.     olopatadine (PATANOL) 0.1 % ophthalmic solution Apply 1 drop to eye daily as needed (for itchy and dry eyes).     omeprazole (PRILOSEC) 20 MG  capsule Take 1 capsule (20 mg total) by mouth 2 (two) times daily before a meal. 60 capsule 3   rosuvastatin (CRESTOR) 20 MG tablet Take 1 tablet (20 mg total) by mouth daily. 90 tablet 1   Semaglutide (RYBELSUS) 3 MG TABS TAKE 1 TABLET DAILY 30 MINUTES BEFORE BREAKFAST WITH WATER 90 tablet 1   sildenafil (VIAGRA) 100 MG tablet Take 100 mg by mouth daily as needed for erectile dysfunction. Use as directed     testosterone cypionate (DEPOTESTOSTERONE CYPIONATE) 200 MG/ML injection Inject 200 mg into the muscle every 14 (fourteen) days.     triamcinolone (KENALOG) 0.1 % Apply 1 application topically at bedtime.     diltiazem (CARDIZEM) 30 MG tablet Take 1 tablet (30 mg total) by mouth 4 (four) times daily as needed (elevated HR). (Patient not taking: Reported on 09/07/2022) 10 tablet 1   FREESTYLE LITE test strip USE 1 STRIP DAILY TO MONITOR GLUCOSE LEVELS (Patient not taking: Reported on 09/07/2022) 100 strip 3   Lancets (FREESTYLE) lancets 1 each by Other route daily. Use to monitor glucose levels daily; E11.9 (Patient not taking: Reported on 09/07/2022) 100 each 2   meclizine (ANTIVERT) 12.5 MG tablet Take 1 tablet (12.5 mg total) by mouth 2 (two) times daily as needed for dizziness. (Patient not taking: Reported on 09/07/2022) 10 tablet 0   pioglitazone (ACTOS) 15 MG tablet Take 2 tablets (30 mg total) by mouth daily. (Patient not taking: Reported on 05/31/2022) 180 tablet 3   No current facility-administered medications for this visit.    PHYSICAL EXAM: Vitals:   09/07/22 0806  BP: (!) 140/60  Pulse: 66  SpO2: 98%  Weight: 158 lb 6.4 oz (71.8 kg)  Height: 5' 6.5" (1.689 m)   Body mass index is 25.18 kg/m.  Wt Readings from Last 3 Encounters:  09/07/22 158 lb 6.4 oz (71.8 kg)  05/31/22 160 lb (72.6 kg)  05/17/22 163 lb (73.9  kg)    General: Well developed, well nourished male in no apparent distress.  HEENT: AT/[place], no external lesions.  Eyes: Conjunctiva clear and no icterus. Neck: Neck supple  Lungs: Respirations not labored Neurologic: Alert, oriented, normal speech Extremities / Skin: Dry. No sores or rashes noted.  Psychiatric: Does not appear depressed or anxious  Diabetic Foot Exam: Monofilament sensory exam intact b/l, b/l mild callus and bilateral bunion on lateral great toes, no ulceration, Dorsalis Pedis palpable b/l  Diabetic Foot Exam - Simple   No data filed    LABS Reviewed Lab Results  Component Value Date   HGBA1C 7.6 (H) 09/01/2022   HGBA1C 7.0 (H) 05/13/2022   HGBA1C 7.1 (H) 01/11/2022   Lab Results  Component Value Date   FRUCTOSAMINE 239 10/03/2019   Lab Results  Component Value Date   CHOL 148 06/13/2022   HDL 49 06/13/2022   LDLCALC 85 06/13/2022   LDLDIRECT 146.5 10/29/2012   TRIG 73 06/13/2022   CHOLHDL 3.0 06/13/2022   Lab Results  Component Value Date   MICRALBCREAT 1.7 05/13/2022   MICRALBCREAT 1.1 04/30/2021   Lab Results  Component Value Date   CREATININE 0.98 09/01/2022   Lab Results  Component Value Date   GFR 77.91 09/01/2022    ASSESSMENT / PLAN  1. Uncontrolled type 2 diabetes mellitus with hyperglycemia, without long-term current use of insulin (HCC)   2. Type 2 diabetes mellitus with diabetic neuropathy, without long-term current use of insulin (HCC)   3. Long term (current) use of oral hypoglycemic drugs  Diabetes Mellitus type 2, complicated by diabetic neuropathy. - Diabetic status / severity: Uncontrolled  Lab Results  Component Value Date   HGBA1C 7.6 (H) 09/01/2022    - Hemoglobin A1c goal : <7%  Discussed about diet control and limiting high carbohydrate meals/snacks including ice cream/watermelon.  Discussed about adding antidiabetic medication versus try with diet and start exercising.  Patient preferred to start exercise  and diet control.  Patient reported limited exercise due to feet pain, used to do good exercise when he was on the insert for soles.  He has plan to go to gym and exercise regularly.  Tolerate higher dose of Rybelsus 7 mg.  - Medications: No change. Rybelsus 3 mg daily. Jardiance 25 mg daily. Metformin XR 1000 mg two times a day.  - Home glucose testing: Daily in the morning fasting and at bedtime few times a week. - Discussed/ Gave Hypoglycemia treatment plan.  # Consult : not required at this time.   # Annual urine for microalbuminuria/ creatinine ratio, no microalbuminuria currently, continue ACE/ARB /losartan. Last  Lab Results  Component Value Date   MICRALBCREAT 1.7 05/13/2022    # Foot check nightly / neuropathy.  Patient is also following with podiatry.  Patient will talk with podiatry clinic and ask to send a note to to our clinic to review.  He had changes with diabetic neuropathy with callus and bunions.  He will benefit from diabetic shoes and inserts.  # Annual dilated diabetic eye exams.  No retinopathy.  Following with ophthalmology.  - Diet: Make healthy diabetic food choices - Life style / activity / exercise: Discussed.  2. Blood pressure  -  BP Readings from Last 1 Encounters:  09/07/22 (!) 140/60    - Control is in target.  - No change in current plans.  3. Lipid status / Hyperlipidemia - Last  Lab Results  Component Value Date   LDLCALC 85 06/13/2022   - Continue rosuvastatin 20 mg daily.  Diagnoses and all orders for this visit:  Uncontrolled type 2 diabetes mellitus with hyperglycemia, without long-term current use of insulin (HCC)  Type 2 diabetes mellitus with diabetic neuropathy, without long-term current use of insulin (HCC)  Long term (current) use of oral hypoglycemic drugs    DISPOSITION Follow up in clinic in 3 months suggested.   All questions answered and patient verbalized understanding of the plan.  Harold Barrington Worley,  MD [facility] Endocrinology [facility] [address] [place], [address] [place], [place] [address] Phone # [phone]  At least part of this note was generated using voice recognition software. Inadvertent word errors may have occurred, which were not recognized during the proofreading process.

## 2022-09-09 ENCOUNTER — Telehealth: Payer: Self-pay | Admitting: Podiatry

## 2022-09-09 NOTE — Telephone Encounter (Signed)
Pt called and stated he had left message for the orthotic/diabetic shoe dept earlier this week but has not heard back. I did tell her she was back yesterday and is in [place] today.  I checked safestep and the pt stated he nno longer see's Dr Lucianne Muss and I am sending the paperwork to the new endo for pt. He said thank you!

## 2022-09-12 ENCOUNTER — Encounter: Payer: Self-pay | Admitting: Podiatry

## 2022-09-13 NOTE — Telephone Encounter (Signed)
Patient has Fo's that are being re-furbished and appt on 9/26th for DM shoe eval

## 2022-09-15 ENCOUNTER — Encounter: Payer: Medicare Other | Admitting: *Deleted

## 2022-09-15 VITALS — BP 123/65 | HR 79 | Temp 98.0°F | Resp 18 | Ht 66.0 in | Wt 158.3 lb

## 2022-09-15 DIAGNOSIS — Z006 Encounter for examination for normal comparison and control in clinical research program: Secondary | ICD-10-CM

## 2022-09-15 NOTE — Research (Cosign Needed Addendum)
AA HEART Informed Consent   Subject Name: [name]  Subject met inclusion and exclusion criteria.  The informed consent form, study requirements and expectations were reviewed with the subject and questions and concerns were addressed prior to the signing of the consent form.  The subject verbalized understanding of the trial requirements.  The subject agreed to participate in the AA HEART  trial and signed the informed consent at 10:04 AM on 09-15-2022.  The informed consent was obtained prior to performance of any protocol-specific procedures for the subject.  A copy of the signed informed consent was given to the subject and a copy was placed in the subject's medical record.   [place] Arine Foley, Research Coordinator    Are there any labs that are clinically significant?  Yes []  OR No[]      ACCESSION NO. [id number]                                             Page 1 of 1                                                        INVESTIGATOR: ([id number])                          PROTOCOL   [id number]                     Thomasene Ripple, M.D.                              INVESTIGATOR NO.: [id number]                     c/o [facility]                         SUBJECT NUMBER: [id number]                     [place]                 SUBJECT INITIALS NOT COLLECTED:                     [address]                          VISIT: D0                     [place], [place] 0                   SPONSOR REPORT TO:                 COLLECTION TIME:10:52 DATE:15-Sep-2022                     Cruzita Lederer                 DATE RECEIVED IN LABORATORY: 16-Sep-2022  c/o Sponsor(or Addtl) [facility].Study DATE REPORTED BY LABORATORY: 17-Sep-2022                     [facility]                          SEX: M  AGE: 35V                     [id number]                   [place], [place] [phone]                                                                                         Ref. Ranges               Clinical    Comments                                                                          Significance                                                                            Yes*  No                    HEMATOLOGY&DIFFERENTIAL PANEL                      HGB            13.6         12.5-17.0 g/dL                      HCT            43           37-51 %                      RBC            5.7          4.0-5.8 x106/uL                      MCH            24      L    26-34 pg                      MCHC  32           31-38 g/dL                      RDW            19.0    H    12.0-15.0 %                      RBC Morph      Microcytic                                         Anisocytosis                        MCV            75      L    80-100 fL                      WBC            5.40         3.80-10.70 x103/uL                      Neutrophil     3.26         1.96-7.23 x103/uL                      Lymphocyte     1.69         0.80-3.00 x103/uL                      Monocytes      0.28         0.12-0.92 x103/uL                      Eosinophil     0.13         0.00-0.57 x103/uL                      Basophils      0.04         0.00-0.20 x103/uL                      Neutrophil     60.4         40.5-75.0 %                      Lymphocyte     31.3         15.4-48.5%                      Monocytes      5.2          2.6-10.1 %                      Eosinophil     2.5          0.0-6.8 %                      Basophils      0.7          0.0-2.0 %  Platelets      222          130-394 x103/uL   ANC                      ANC            3.26         1.96-7.23 x103/uL                    HBA1C                      Fast HbA1c     7.5     H    <6.5%    RETICULOCYTES                      Retic %        1.5          0.6-2.5 %                      Retic Abs      0.084        0.030-0.130 x106/uL

## 2022-09-15 NOTE — Progress Notes (Signed)
This 72 year old gentleman is here for AA Heart.  Doing well.  He had a coronary CTA 06/09/2022.  There was evidence of aortic atherosclerosis.    The findings are as follows  Coronary Arteries:  Normal coronary origin.  Right dominance.   RCA is a large dominant artery that gives rise to PDA and PLA. Due to artifact unable to visualize the RCA.   Left main is a large artery that gives rise to LAD and LCX arteries.   LAD is a large vessel. There Proximal to mid LAD with a moderate (50-69%) calcified plaque. The mid LAD with mild (25-49%) calcified plaque. The distal LAD with no apparent plaques. D1 is a small vessel with a focal plaque in the proximal portion of the vessel that is not quantifiable due to size to the vessel and lumen not fully visualized.   LCX is a non-dominant artery that gives rise to one large OM1 branch. LCX with a mild plaque in the proximal portion of the vessel.   Coronary Calcium Score:   Left main: 0   Left anterior descending artery: 367   Left circumflex artery: 86.2   Right coronary artery: 0   Total: 453   Percentile: 87  FFR was mildly reduced in the distal LAD.    He has HTN and DM, and is seen by Dr. Izora Ribas.  Seems to be doing well.   Last LDL was 85 measured in June.   No current symptoms.   Alert, oriented male in NAD BP 123/65 P79 T98 R 18 Wt 158 Lungs clear Cor reg with 1/6 SEM Abd soft without obvious masses Ext   no edema  Impression  AA heart and objectives explained to patient.  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

## 2022-09-23 ENCOUNTER — Telehealth: Payer: Self-pay

## 2022-09-23 ENCOUNTER — Telehealth: Payer: Self-pay | Admitting: Endocrinology

## 2022-09-23 ENCOUNTER — Other Ambulatory Visit: Payer: Self-pay | Admitting: Endocrinology

## 2022-09-23 MED ORDER — RYBELSUS 3 MG PO TABS: ORAL_TABLET | ORAL | 3 refills | Status: DC

## 2022-09-23 NOTE — Telephone Encounter (Signed)
Patient is requesting updated RX be sent to [facility] for his Rybelsus. Our records reflect that a 90 day supply with 1 refill was sent to [facility] on 08/17/22, but what patient received from [facility] was 30 days worth. Per patient he has been trying to get this situation resolved, for several days, by contacting this office and [facility]. Advises that he has not received any calls from phone messages that he left and that per [facility] this could be resolved on their end with a call from our office to verify the RX.  Patient advises that he is going to be out of Rybelsus soon and needs assistance. Patient also advises that he spoke of this issue at is last appointment on 09/07/22 and was assured that this would be resolved.    Patient is requesting a call back today at [phone]

## 2022-09-23 NOTE — Telephone Encounter (Signed)
Spoke with [facility] regarding Rybelsus prescription, patient was sent new prescription by MD to clarify any errors with initial prescription that patient was encountering getting filled correctly. Patient called to make aware however received patient's VM. RN requested callback from patient.

## 2022-09-29 ENCOUNTER — Ambulatory Visit: Payer: Medicare Other

## 2022-09-29 DIAGNOSIS — M722 Plantar fascial fibromatosis: Secondary | ICD-10-CM

## 2022-09-29 DIAGNOSIS — M2141 Flat foot [pes planus] (acquired), right foot: Secondary | ICD-10-CM

## 2022-09-29 DIAGNOSIS — E119 Type 2 diabetes mellitus without complications: Secondary | ICD-10-CM

## 2022-09-29 NOTE — Progress Notes (Signed)
Patient presents to the office today for diabetic shoe and insole measuring.  Patient was measured with brannock device to determine size and width for 1 pair of extra depth shoes and foam casted for 3 pair of insoles.   Documentation of medical necessity will be sent to patient's treating diabetic doctor to verify and sign.   Patient's diabetic provider: [place] Thapa   Shoes and insoles will be ordered at that time and patient will be notified for an appointment for fitting when they arrive.   Shoe size (per patient): 11 2E Brannock measurement: 10.5 Patient shoe selection- Shoe choice:   [id number] [name] size ordered: 11WD

## 2022-09-30 ENCOUNTER — Other Ambulatory Visit: Payer: Self-pay | Admitting: Endocrinology

## 2022-09-30 DIAGNOSIS — Z794 Long term (current) use of insulin: Secondary | ICD-10-CM

## 2022-10-06 ENCOUNTER — Other Ambulatory Visit: Payer: Self-pay

## 2022-10-06 DIAGNOSIS — E1165 Type 2 diabetes mellitus with hyperglycemia: Secondary | ICD-10-CM

## 2022-10-06 MED ORDER — METFORMIN HCL ER 500 MG PO TB24
1000.0000 mg | ORAL_TABLET | Freq: Two times a day (BID) | ORAL | 2 refills | Status: DC
Start: 2022-10-06 — End: 2023-01-02

## 2022-10-06 NOTE — Telephone Encounter (Signed)
patient is in the lobby has been having ongoing problems with getting his Metformin and Rybelus prescriptions "corrected/updated". Multiple calls and visits to office to try and resolve per patient.   Per patient:  Metformin should be 1000 MG extended release - 2 pills twice daily for 90 days to [facility] - total of pills for 90 day supply should be 360  Rybelsus should be 3 MG - 1 daily for 90 days to [facility] - total of pills for 90 day supply should be 90  I did attempt to explain what had been sent and done at various other points of contact, however patient is not satisfied and is requesting to speak with someone in person today

## 2022-10-12 ENCOUNTER — Encounter: Payer: Self-pay | Admitting: Endocrinology

## 2022-10-12 LAB — HM DIABETES EYE EXAM

## 2022-10-25 ENCOUNTER — Telehealth: Payer: Self-pay

## 2022-10-25 NOTE — Telephone Encounter (Signed)
Patient came to pick up his refurbished orthotics. He didn't pay the $180 at the time that he dropped off his orthotics. I explained to him that each pair of refurbished orthotics is $90. He told me no one told him that. He was very upset that no one had called him to tell him his orthotics are in, I told him there is only one person working in our orthotics department. He said he will think about coming back to pick them up and left. Shelly double checked that he never paid.

## 2022-10-26 DIAGNOSIS — M79673 Pain in unspecified foot: Secondary | ICD-10-CM

## 2022-11-25 ENCOUNTER — Ambulatory Visit (INDEPENDENT_AMBULATORY_CARE_PROVIDER_SITE_OTHER): Payer: Medicare Other

## 2022-11-25 DIAGNOSIS — E1149 Type 2 diabetes mellitus with other diabetic neurological complication: Secondary | ICD-10-CM | POA: Diagnosis not present

## 2022-11-25 DIAGNOSIS — M722 Plantar fascial fibromatosis: Secondary | ICD-10-CM

## 2022-11-25 DIAGNOSIS — M2141 Flat foot [pes planus] (acquired), right foot: Secondary | ICD-10-CM

## 2022-11-25 DIAGNOSIS — M2142 Flat foot [pes planus] (acquired), left foot: Secondary | ICD-10-CM

## 2022-11-29 NOTE — Progress Notes (Signed)

## 2022-12-09 ENCOUNTER — Ambulatory Visit (INDEPENDENT_AMBULATORY_CARE_PROVIDER_SITE_OTHER): Payer: Medicare Other | Admitting: Endocrinology

## 2022-12-09 ENCOUNTER — Encounter: Payer: Self-pay | Admitting: Endocrinology

## 2022-12-09 VITALS — BP 118/60 | HR 64 | Resp 20 | Ht 66.0 in | Wt 158.8 lb

## 2022-12-09 DIAGNOSIS — E118 Type 2 diabetes mellitus with unspecified complications: Secondary | ICD-10-CM

## 2022-12-09 DIAGNOSIS — Z7984 Long term (current) use of oral hypoglycemic drugs: Secondary | ICD-10-CM

## 2022-12-09 LAB — POCT GLYCOSYLATED HEMOGLOBIN (HGB A1C): Hemoglobin A1C: 6.9 % — AB (ref 4.0–5.6)

## 2022-12-09 NOTE — Progress Notes (Signed)
Outpatient Endocrinology Note Harold Cyani Kallstrom, MD  12/09/22  Patient's Name: [name]    DOB: November 08, 1950    MRN: [id number]                                                    REASON OF VISIT: Follow up for type 2 diabetes mellitus  PCP: Kingsten Enfield, [place], [place]  HISTORY OF PRESENT ILLNESS:   [name] is a 72 y.o. old male with past medical history listed below, is here for follow up of type 2 diabetes mellitus.   Pertinent Diabetes History: Patient was diagnosed with type 2 diabetes mellitus in 1996.  He has been on different antidiabetic medications including metformin, acarbose, Prandin, Invokana at different times in the past.  He has relatively controlled diabetes mellitus with hemoglobin A1c mostly in the range of 7s%.  Chronic Diabetes Complications : Retinopathy: yes, following with ophthalmology. Last ophthalmology exam was done on 10/2022. Every 6 months.  Nephropathy: no Peripheral neuropathy: yes, following with podiatry.  Coronary artery disease: no Stroke: no  Relevant comorbidities and cardiovascular risk factors: Obesity: no Body mass index is 25.63 kg/m.  Hypertension: yes Hyperlipidemia. Yes, on statin  Current / Home Diabetic regimen includes: Rybelsus 3 mg daily. Jardiance 25 mg daily. Metformin XR 1000 mg two times a day.  Prior diabetic medications: Actos : edema Rybelsus : lost too much weight on Rybelsus 7 mg.   Glycemic data:   Not able to download and review the glucometer data in the clinic today.  He uses [employer] meter has not received the fax yet.  Hypoglycemia: Patient has no hypoglycemic episodes. Patient has hypoglycemia awareness.  Factors modifying glucose control: 1.  Diabetic diet assessment: 3 meals a day.  2.  Staying active or exercising: Exercising 3 to 4 days a week.  3.  Medication compliance: compliant all of the time.  # Hypogonadism on testosterone supplement topical, managed by primary care provider.  Interval  history  No glucose data to review.  Hemoglobin A1c improved to 6.9% today.  Diabetes regimen as noted above.  He has no GI issues regarding Rybelsus, taking properly.  No other complaints today.  REVIEW OF SYSTEMS As per history of present illness.   PAST MEDICAL HISTORY: Past Medical History:  Diagnosis Date   Arthritis    right shoulder   BPH (benign prostatic hyperplasia)    Hyperlipidemia    Hypertension    Impotence of organic origin    Iron deficiency anemia    Mild atherosclerosis of carotid artery, bilateral    last doppler 12-17-2010   bilateral ICA 1-39%   Nocturia more than twice per night    Testicular hypofunction    Type 2 diabetes mellitus [facility])    endocrinologist-- dr Merri Ray urinary stream    Wears glasses     PAST SURGICAL HISTORY: Past Surgical History:  Procedure Laterality Date   CYSTOSCOPY WITH INSERTION OF UROLIFT N/A 12/12/2017   Procedure: CYSTOSCOPY WITH INSERTION OF UROLIFT;  Surgeon: Jerilee Field, MD;  Location: [facility] [place];  Service: Urology;  Laterality: N/A;   SHOULDER SURGERY Right 2004, 1990   WISDOM TOOTH EXTRACTION      ALLERGIES: Allergies  Allergen Reactions   Actos [Pioglitazone] Swelling    edema   Trandolapril Swelling     w/ Angioedema  Azithromycin Rash   Other Rash    MAGIC MOUTHWASH   Tetracycline Rash    FAMILY HISTORY:  Family History  Problem Relation Age of Onset   Heart disease Mother 78       Deceased   Heart attack Mother    Hypertension Mother    Diabetes Father 76       Deceased   Hypertension Father    Heart disease Sister        heart trouble   Heart attack Sister    Healthy Sister        x6   Heart attack Sister    Diabetes Brother    Heart attack Brother    Heart attack Maternal Grandmother    Cancer Paternal Aunt        x1-Bone   Cancer Paternal Aunt        multiple   Colon cancer Neg Hx    Esophageal cancer Neg Hx    Liver cancer Neg Hx    Stomach cancer  Neg Hx    Pancreatic cancer Neg Hx     SOCIAL HISTORY: Social History   Socioeconomic History   Marital status: Married    Spouse name: [name]   Number of children: 0   Years of education: Not on file   Highest education level: Not on file  Occupational History    Employer: [employer]  Tobacco Use   Smoking status: Never   Smokeless tobacco: Never  Vaping Use   Vaping status: Never Used  Substance and Sexual Activity   Alcohol use: Not Currently    Alcohol/week: 0.0 standard drinks of alcohol   Drug use: No   Sexual activity: Yes  Other Topics Concern   Not on file  Social History Narrative   Regular exercise-yes   Caffeine-2 cups   Married=wife [name] 22 years   Cigar Smoker-quit smoking Jan 2010   Social Determinants of Health   Financial Resource Strain: Not on file  Food Insecurity: Not on file  Transportation Needs: Not on file  Physical Activity: Not on file  Stress: Not on file  Social Connections: Not on file    MEDICATIONS:  Current Outpatient Medications  Medication Sig Dispense Refill   acyclovir (ZOVIRAX) 800 MG tablet Take 800 mg by mouth daily as needed (for flare-up).     aspirin 81 MG tablet Take 81 mg by mouth daily.     atenolol (TENORMIN) 50 MG tablet Take 1 tablet (50 mg total) by mouth daily. 30 tablet 0   cetirizine (ZYRTEC) 10 MG tablet Take 10 mg by mouth as needed.     diltiazem (CARDIZEM) 30 MG tablet Take 1 tablet (30 mg total) by mouth 4 (four) times daily as needed (elevated HR). 10 tablet 1   empagliflozin (JARDIANCE) 25 MG TABS tablet Take 1 tablet (25 mg total) by mouth daily. 90 tablet 3   FREESTYLE LITE test strip USE 1 STRIP DAILY TO MONITOR GLUCOSE LEVELS 100 strip 3   Lancets (FREESTYLE) lancets 1 each by Other route daily. Use to monitor glucose levels daily; E11.9 100 each 2   losartan (COZAAR) 50 MG tablet      meclizine (ANTIVERT) 12.5 MG tablet Take 1 tablet (12.5 mg total) by mouth 2 (two) times daily as  needed for dizziness. 10 tablet 0   meloxicam (MOBIC) 15 MG tablet Take 1 tablet (15 mg total) by mouth daily as needed for pain. 30 tablet 0   metFORMIN (GLUCOPHAGE)  500 MG tablet Take 2 tablets (1,000 mg total) by mouth 2 (two) times daily with a meal. 120 tablet 2   metFORMIN (GLUCOPHAGE-XR) 500 MG 24 hr tablet Take 2 tablets (1,000 mg total) by mouth 2 (two) times daily with a meal. 120 tablet 2   Multiple Vitamin (MULTIVITAMIN) tablet Take 1 tablet by mouth daily.     olopatadine (PATANOL) 0.1 % ophthalmic solution Apply 1 drop to eye daily as needed (for itchy and dry eyes).     omeprazole (PRILOSEC) 20 MG capsule Take 1 capsule (20 mg total) by mouth 2 (two) times daily before a meal. 60 capsule 3   rosuvastatin (CRESTOR) 20 MG tablet Take 1 tablet (20 mg total) by mouth daily. 90 tablet 1   Semaglutide (RYBELSUS) 3 MG TABS Take 1 tablet daily 30 minutes before breakfast with water 90 tablet 3   sildenafil (VIAGRA) 100 MG tablet Take 100 mg by mouth daily as needed for erectile dysfunction. Use as directed     testosterone cypionate (DEPOTESTOSTERONE CYPIONATE) 200 MG/ML injection Inject 200 mg into the muscle every 14 (fourteen) days.     triamcinolone (KENALOG) 0.1 % Apply 1 application topically at bedtime.     ezetimibe (ZETIA) 10 MG tablet Take 1 tablet (10 mg total) by mouth daily. 90 tablet 3   No current facility-administered medications for this visit.    PHYSICAL EXAM: Vitals:   12/09/22 0821  BP: 118/60  Pulse: 64  Resp: 20  SpO2: 98%  Weight: 158 lb 12.8 oz (72 kg)  Height: 5\' 6"  (1.676 m)    Body mass index is 25.63 kg/m.  Wt Readings from Last 3 Encounters:  12/09/22 158 lb 12.8 oz (72 kg)  09/15/22 158 lb 4.6 oz (71.8 kg)  09/07/22 158 lb 6.4 oz (71.8 kg)    General: Well developed, well nourished male in no apparent distress.  HEENT: AT/[place], no external lesions.  Eyes: Conjunctiva clear and no icterus. Neck: Neck supple  Lungs: Respirations not  labored Neurologic: Alert, oriented, normal speech Extremities / Skin: Dry. No sores or rashes noted.  Psychiatric: Does not appear depressed or anxious   Diabetic Foot Exam - Simple   No data filed    LABS Reviewed Lab Results  Component Value Date   HGBA1C 6.9 (A) 12/09/2022   HGBA1C 7.6 (H) 09/01/2022   HGBA1C 7.0 (H) 05/13/2022   Lab Results  Component Value Date   FRUCTOSAMINE 239 10/03/2019   Lab Results  Component Value Date   CHOL 148 06/13/2022   HDL 49 06/13/2022   LDLCALC 85 06/13/2022   LDLDIRECT 146.5 10/29/2012   TRIG 73 06/13/2022   CHOLHDL 3.0 06/13/2022   Lab Results  Component Value Date   MICRALBCREAT 1.7 05/13/2022   MICRALBCREAT 1.1 04/30/2021   Lab Results  Component Value Date   CREATININE 0.98 09/01/2022   Lab Results  Component Value Date   GFR 77.91 09/01/2022    ASSESSMENT / PLAN  1. Controlled type 2 diabetes mellitus with complication, without long-term current use of insulin (HCC)      Diabetes Mellitus type 2, complicated by diabetic neuropathy. - Diabetic status / severity: Controlled  Lab Results  Component Value Date   HGBA1C 6.9 (A) 12/09/2022    - Hemoglobin A1c goal : <7%  Diabetes control has improved with hemoglobin A1c today 6.9%.  - Medications: No change. Rybelsus 3 mg daily. Jardiance 25 mg daily. Metformin XR 1000 mg two times a day.  Asked  to fax glucometer data and will review once received.  - Home glucose testing: Daily in the morning fasting and at bedtime few times a week. - Discussed/ Gave Hypoglycemia treatment plan.  # Consult : not required at this time.   # Annual urine for microalbuminuria/ creatinine ratio, no microalbuminuria currently, continue ACE/ARB /losartan. Last  Lab Results  Component Value Date   MICRALBCREAT 1.7 05/13/2022    # Foot check nightly / neuropathy.  Patient is also following with podiatry.  He has diabetic shoes.  He had changes with diabetic neuropathy  with callus and bunions.  He will benefit from diabetic shoes and inserts.  # Annual dilated diabetic eye exams.  He has retinopathy.  Following with ophthalmology every 6 months.  - Diet: Make healthy diabetic food choices - Life style / activity / exercise: Discussed.  2. Blood pressure  -  BP Readings from Last 1 Encounters:  12/09/22 118/60    - Control is in target.  - No change in current plans.  3. Lipid status / Hyperlipidemia - Last  Lab Results  Component Value Date   LDLCALC 85 06/13/2022   - Continue rosuvastatin 20 mg daily.  Diagnoses and all orders for this visit:  Controlled type 2 diabetes mellitus with complication, without long-term current use of insulin (HCC) -     POCT glycosylated hemoglobin (Hb A1C) -     Renal function panel -     Hemoglobin A1c -     Microalbumin / creatinine urine ratio     DISPOSITION Follow up in clinic in 4 months suggested, labs prior to visit.   All questions answered and patient verbalized understanding of the plan.  Harold Tor Tsuda, MD [facility] Endocrinology [facility] [address] [place], [address] [place], [place] [address] Phone # [phone]  At least part of this note was generated using voice recognition software. Inadvertent word errors may have occurred, which were not recognized during the proofreading process.

## 2023-01-02 ENCOUNTER — Other Ambulatory Visit: Payer: Self-pay | Admitting: Endocrinology

## 2023-01-02 DIAGNOSIS — E1165 Type 2 diabetes mellitus with hyperglycemia: Secondary | ICD-10-CM

## 2023-01-25 ENCOUNTER — Encounter: Payer: Self-pay | Admitting: Internal Medicine

## 2023-02-04 ENCOUNTER — Other Ambulatory Visit: Payer: Self-pay | Admitting: Nurse Practitioner

## 2023-02-04 DIAGNOSIS — E1159 Type 2 diabetes mellitus with other circulatory complications: Secondary | ICD-10-CM

## 2023-03-06 ENCOUNTER — Ambulatory Visit (AMBULATORY_SURGERY_CENTER): Payer: Medicare Other

## 2023-03-06 VITALS — Ht 66.0 in | Wt 154.0 lb

## 2023-03-06 DIAGNOSIS — Z1211 Encounter for screening for malignant neoplasm of colon: Secondary | ICD-10-CM

## 2023-03-06 MED ORDER — SUFLAVE 178.7 G PO SOLR: 1.0000 | Freq: Once | ORAL | 0 refills | Status: AC

## 2023-03-06 NOTE — Progress Notes (Signed)
 No egg or soy allergy known to patient  No issues known to pt with past sedation with any surgeries or procedures Patient denies ever being told they had issues or difficulty with intubation  No FH of Malignant Hyperthermia Pt is not on diet pills; does take GLP injection Pt is not on  home 02  Pt is not on blood thinners  Pt denies issues with constipation  No A fib or A flutter Have any cardiac testing pending--no Ambulates independently

## 2023-03-21 ENCOUNTER — Encounter: Payer: Self-pay | Admitting: Certified Registered Nurse Anesthetist

## 2023-03-24 ENCOUNTER — Ambulatory Visit: Payer: Medicare Other | Admitting: Internal Medicine

## 2023-03-24 ENCOUNTER — Encounter: Payer: Self-pay | Admitting: Internal Medicine

## 2023-03-24 VITALS — BP 137/75 | HR 67 | Temp 97.9°F | Resp 18 | Ht 66.0 in | Wt 154.0 lb

## 2023-03-24 DIAGNOSIS — K5730 Diverticulosis of large intestine without perforation or abscess without bleeding: Secondary | ICD-10-CM

## 2023-03-24 DIAGNOSIS — K648 Other hemorrhoids: Secondary | ICD-10-CM | POA: Diagnosis not present

## 2023-03-24 DIAGNOSIS — Z1211 Encounter for screening for malignant neoplasm of colon: Secondary | ICD-10-CM

## 2023-03-24 MED ORDER — SODIUM CHLORIDE 0.9 % IV SOLN
500.0000 mL | INTRAVENOUS | Status: DC
Start: 2023-03-24 — End: 2023-03-24

## 2023-03-24 NOTE — Op Note (Signed)
 [facility] Patient Name: [name] Procedure Date: 03/24/2023 11:25 AM MRN: [id number] Endoscopist: Iva Boop , MD, [id number] Age: 73 Referring MD:  Date of Birth: 1950/01/23 Gender: Male Account #: [id number] Procedure:                Colonoscopy Indications:              Screening for colorectal malignant neoplasm, Last                            colonoscopy: 2015 Medicines:                Monitored Anesthesia Care Procedure:                Pre-Anesthesia Assessment:                           - Prior to the procedure, a History and Physical                            was performed, and patient medications and                            allergies were reviewed. The patient's tolerance of                            previous anesthesia was also reviewed. The risks                            and benefits of the procedure and the sedation                            options and risks were discussed with the patient.                            All questions were answered, and informed consent                            was obtained. Prior Anticoagulants: The patient has                            taken no anticoagulant or antiplatelet agents. ASA                            Grade Assessment: III - A patient with severe                            systemic disease. After reviewing the risks and                            benefits, the patient was deemed in satisfactory                            condition to undergo the procedure.  After obtaining informed consent, the colonoscope                            was passed under direct vision. Throughout the                            procedure, the patient's blood pressure, pulse, and                            oxygen saturations were monitored continuously. The                            Olympus Scope SN [id number] was introduced through the                            anus and advanced to the the  cecum, identified by                            appendiceal orifice and ileocecal valve. The                            colonoscopy was performed without difficulty. The                            patient tolerated the procedure well. The quality                            of the bowel preparation was good. The ileocecal                            valve, appendiceal orifice, and rectum were                            photographed. The bowel preparation used was                            SUFLAVE via split dose instruction. Scope In: 11:34:43 AM Scope Out: 11:49:51 AM Scope Withdrawal Time: 0 hours 12 minutes 4 seconds  Total Procedure Duration: 0 hours 15 minutes 8 seconds  Findings:                 The perianal and digital rectal examinations were                            normal.                           Multiple diverticula were found in the sigmoid                            colon, descending colon and ascending colon.                           Internal hemorrhoids were found.  The exam was otherwise without abnormality on                            direct and retroflexion views. Complications:            No immediate complications. Estimated Blood Loss:     Estimated blood loss: none. Impression:               - Diverticulosis in the sigmoid colon, in the                            descending colon and in the ascending colon.                           - Internal hemorrhoids.                           - The examination was otherwise normal on direct                            and retroflexion views.                           - No specimens collected. Recommendation:           - Patient has a contact number available for                            emergencies. The signs and symptoms of potential                            delayed complications were discussed with the                            patient. Return to normal activities tomorrow.                             Written discharge instructions were provided to the                            patient.                           - Resume previous diet.                           - Continue present medications.                           - No repeat colonoscopy due to current age (43                            years or older) and the absence of colonic polyps. Iva Boop, MD 03/24/2023 12:02:32 PM This report has been signed electronically.

## 2023-03-24 NOTE — Progress Notes (Signed)
 Patient states there have been no changes to medical or surgical history since time of pre-visit.

## 2023-03-24 NOTE — Progress Notes (Signed)
 [facility] History and Physical   Primary Care Physician:  Thapa, [place], MD   Reason for Procedure:    Encounter Diagnosis  Name Primary?   Special screening for malignant neoplasms, colon Yes     Plan:    colonoscopy     HPI: [name] is a 73 y.o. male here last colonoscopy 03/2013   Past Medical History:  Diagnosis Date   Arthritis    right shoulder   BPH (benign prostatic hyperplasia)    Hyperlipidemia    Hypertension    Impotence of organic origin    Iron deficiency anemia    Mild atherosclerosis of carotid artery, bilateral    last doppler 12-17-2010   bilateral ICA 1-39%   Nocturia more than twice per night    Testicular hypofunction    Type 2 diabetes mellitus [facility])    endocrinologist-- dr Merri Ray urinary stream    Wears glasses     Past Surgical History:  Procedure Laterality Date   COLONOSCOPY     CYSTOSCOPY WITH INSERTION OF UROLIFT N/A 12/12/2017   Procedure: CYSTOSCOPY WITH INSERTION OF UROLIFT;  Surgeon: Jerilee Field, MD;  Location: [facility] [place];  Service: Urology;  Laterality: N/A;   SHOULDER SURGERY Right 2004, 1990   WISDOM TOOTH EXTRACTION      Prior to Admission medications   Medication Sig Start Date End Date Taking? Authorizing Provider  aspirin 81 MG tablet Take 81 mg by mouth daily.   Yes [provider]  atenolol (TENORMIN) 50 MG tablet Take 1 tablet (50 mg total) by mouth daily. 05/18/20  Yes Sharlene Dory, DO  cetirizine (ZYRTEC) 10 MG tablet Take 10 mg by mouth as needed. 03/08/21  Yes [provider]  diltiazem (CARDIZEM) 30 MG tablet Take 1 tablet (30 mg total) by mouth 4 (four) times daily as needed (elevated HR). 05/07/21  Yes Chandrasekhar, Mahesh A, MD  empagliflozin (JARDIANCE) 25 MG TABS tablet Take 1 tablet (25 mg total) by mouth daily. 04/07/22  Yes Reather Littler, MD  losartan (COZAAR) 50 MG tablet  12/14/20  Yes [provider]  metFORMIN  (GLUCOPHAGE-XR) 500 MG 24 hr tablet TAKE 2 TABLETS(1000 MG) BY MOUTH TWICE DAILY WITH A MEAL 01/02/23  Yes Thapa, [place], MD  Multiple Vitamin (MULTIVITAMIN) tablet Take 1 tablet by mouth daily.   Yes [provider]  omeprazole (PRILOSEC) 20 MG capsule Take 1 capsule (20 mg total) by mouth 2 (two) times daily before a meal. 01/10/22  Yes Zehr, Shanda Bumps D, PA-C  rosuvastatin (CRESTOR) 20 MG tablet TAKE 1 TABLET(20 MG) BY MOUTH DAILY 02/07/23  Yes Gaston Islam., NP  Semaglutide (RYBELSUS) 3 MG TABS Take 1 tablet daily 30 minutes before breakfast with water 09/23/22  Yes Thapa, [place], MD  acyclovir (ZOVIRAX) 800 MG tablet Take 800 mg by mouth daily as needed (for flare-up). Patient not taking: Reported on 03/24/2023 02/15/13   [provider]  ezetimibe (ZETIA) 10 MG tablet Take 1 tablet (10 mg total) by mouth daily. 06/14/22 03/06/23  Dyann Kief, PA-C  FREESTYLE LITE test strip USE 1 STRIP DAILY TO MONITOR GLUCOSE LEVELS 03/30/21   Romero Belling, MD  Lancets (FREESTYLE) lancets 1 each by Other route daily. Use to monitor glucose levels daily; E11.9 09/19/18   Romero Belling, MD  meclizine (ANTIVERT) 12.5 MG tablet Take 1 tablet (12.5 mg total) by mouth 2 (two) times daily as needed for dizziness. Patient not taking: Reported on 03/24/2023 02/26/20  Placido Sou, PA-C  meloxicam (MOBIC) 15 MG tablet Take 1 tablet (15 mg total) by mouth daily as needed for pain. Patient not taking: Reported on 03/24/2023 06/28/22 06/28/23  Vivi Barrack, DPM  olopatadine (PATANOL) 0.1 % ophthalmic solution Apply 1 drop to eye daily as needed (for itchy and dry eyes).    [provider]  sildenafil (VIAGRA) 100 MG tablet Take 100 mg by mouth daily as needed for erectile dysfunction. Use as directed    [provider]  triamcinolone (KENALOG) 0.1 % Apply 1 application topically at bedtime. Patient not taking: Reported on 03/24/2023 12/06/19   [provider]  XYOSTED 75  MG/0.5ML SOAJ Inject 75 mg as directed once a week. 11/07/22   [provider]    Current Outpatient Medications  Medication Sig Dispense Refill   aspirin 81 MG tablet Take 81 mg by mouth daily.     atenolol (TENORMIN) 50 MG tablet Take 1 tablet (50 mg total) by mouth daily. 30 tablet 0   cetirizine (ZYRTEC) 10 MG tablet Take 10 mg by mouth as needed.     diltiazem (CARDIZEM) 30 MG tablet Take 1 tablet (30 mg total) by mouth 4 (four) times daily as needed (elevated HR). 10 tablet 1   empagliflozin (JARDIANCE) 25 MG TABS tablet Take 1 tablet (25 mg total) by mouth daily. 90 tablet 3   losartan (COZAAR) 50 MG tablet      metFORMIN (GLUCOPHAGE-XR) 500 MG 24 hr tablet TAKE 2 TABLETS(1000 MG) BY MOUTH TWICE DAILY WITH A MEAL 360 tablet 4   Multiple Vitamin (MULTIVITAMIN) tablet Take 1 tablet by mouth daily.     omeprazole (PRILOSEC) 20 MG capsule Take 1 capsule (20 mg total) by mouth 2 (two) times daily before a meal. 60 capsule 3   rosuvastatin (CRESTOR) 20 MG tablet TAKE 1 TABLET(20 MG) BY MOUTH DAILY 90 tablet 3   Semaglutide (RYBELSUS) 3 MG TABS Take 1 tablet daily 30 minutes before breakfast with water 90 tablet 3   acyclovir (ZOVIRAX) 800 MG tablet Take 800 mg by mouth daily as needed (for flare-up). (Patient not taking: Reported on 03/24/2023)     ezetimibe (ZETIA) 10 MG tablet Take 1 tablet (10 mg total) by mouth daily. 90 tablet 3   FREESTYLE LITE test strip USE 1 STRIP DAILY TO MONITOR GLUCOSE LEVELS 100 strip 3   Lancets (FREESTYLE) lancets 1 each by Other route daily. Use to monitor glucose levels daily; E11.9 100 each 2   meclizine (ANTIVERT) 12.5 MG tablet Take 1 tablet (12.5 mg total) by mouth 2 (two) times daily as needed for dizziness. (Patient not taking: Reported on 03/24/2023) 10 tablet 0   meloxicam (MOBIC) 15 MG tablet Take 1 tablet (15 mg total) by mouth daily as needed for pain. (Patient not taking: Reported on 03/24/2023) 30 tablet 0   olopatadine (PATANOL) 0.1 %  ophthalmic solution Apply 1 drop to eye daily as needed (for itchy and dry eyes).     sildenafil (VIAGRA) 100 MG tablet Take 100 mg by mouth daily as needed for erectile dysfunction. Use as directed     triamcinolone (KENALOG) 0.1 % Apply 1 application topically at bedtime. (Patient not taking: Reported on 03/24/2023)     XYOSTED 75 MG/0.5ML SOAJ Inject 75 mg as directed once a week.     Current Facility-Administered Medications  Medication Dose Route Frequency Provider Last Rate Last Admin   0.9 %  sodium chloride infusion  500 mL Intravenous Continuous Leone Payor,  Maryjean Morn, MD        Allergies as of 03/24/2023 - Review Complete 03/24/2023  Allergen Reaction Noted   Trandolapril Swelling    Actos [pioglitazone] Swelling 01/31/2012   Azithromycin Rash    Other Rash 02/22/2013   Tetracycline Rash     Family History  Problem Relation Age of Onset   Heart disease Mother 85       Deceased   Heart attack Mother    Hypertension Mother    Diabetes Father 9       Deceased   Hypertension Father    Heart disease Sister        heart trouble   Heart attack Sister    Healthy Sister        x6   Heart attack Sister    Diabetes Brother    Heart attack Brother    Cancer Paternal Aunt        x1-Bone   Cancer Paternal Aunt        multiple   Heart attack Maternal Grandmother    Colon cancer Neg Hx    Esophageal cancer Neg Hx    Liver cancer Neg Hx    Stomach cancer Neg Hx    Pancreatic cancer Neg Hx    Colon polyps Neg Hx    Rectal cancer Neg Hx     Social History   Socioeconomic History   Marital status: Married    Spouse name: [name]   Number of children: 0   Years of education: Not on file   Highest education level: Not on file  Occupational History    Employer: [employer]  Tobacco Use   Smoking status: Never   Smokeless tobacco: Never  Vaping Use   Vaping status: Never Used  Substance and Sexual Activity   Alcohol use: Not Currently    Alcohol/week: 0.0  standard drinks of alcohol   Drug use: No   Sexual activity: Yes  Other Topics Concern   Not on file  Social History Narrative   Regular exercise-yes   Caffeine-2 cups   Married=wife [name] 22 years   Cigar Smoker-quit smoking Jan 2010   Social Drivers of Corporate investment banker Strain: Not on file  Food Insecurity: Not on file  Transportation Needs: Not on file  Physical Activity: Not on file  Stress: Not on file  Social Connections: Not on file  Intimate Partner Violence: Not on file    Review of Systems:  All other review of systems negative except as mentioned in the HPI.  Physical Exam: Vital signs BP (!) 155/72   Pulse (!) 59   Temp 97.9 F (36.6 C) (Skin)   Ht 5\' 6"  (1.676 m)   Wt 154 lb (69.9 kg)   SpO2 100%   BMI 24.86 kg/m   General:   Alert,  Well-developed, well-nourished, pleasant and cooperative in NAD Lungs:  Clear throughout to auscultation.   Heart:  Regular rate and rhythm; no murmurs, clicks, rubs,  or gallops. Abdomen:  Soft, nontender and nondistended. Normal bowel sounds.   Neuro/Psych:  Alert and cooperative. Normal mood and affect. A and O x 3   @Matteus Mcnelly  Sena Slate, MD, [facility] [phone] (pager) 03/24/2023 11:24 AM@

## 2023-03-24 NOTE — Progress Notes (Signed)
 Report given to PACU, vss

## 2023-03-24 NOTE — Patient Instructions (Addendum)
 No polyps or cancer were seen.  You do have a condition called diverticulosis - common and not usually a problem. Please read the handout provided.  Hemorrhoids also noted.  I appreciate the opportunity to care for you. Iva Boop, MD, [facility]  Resume previous diet Continue present medications There were no colon polyps seen today!  You will NOT need another screening colonoscopy, HOWEVER,  Please call us at [phone] if you have a change in bowel habits, change in family history of colo-rectal cancer, rectal bleeding or other GI concern in the future!  Handouts/information given for diverticulosis and hemorrhoids  YOU HAD AN ENDOSCOPIC PROCEDURE TODAY AT THE [facility]:   Refer to the procedure report that was given to you for any specific questions about what was found during the examination.  If the procedure report does not answer your questions, please call your gastroenterologist to clarify.  If you requested that your care partner not be given the details of your procedure findings, then the procedure report has been included in a sealed envelope for you to review at your convenience later.  YOU SHOULD EXPECT: Some feelings of bloating in the abdomen. Passage of more gas than usual.  Walking can help get rid of the air that was put into your GI tract during the procedure and reduce the bloating. If you had a lower endoscopy (such as a colonoscopy or flexible sigmoidoscopy) you may notice spotting of blood in your stool or on the toilet paper. If you underwent a bowel prep for your procedure, you may not have a normal bowel movement for a few days.  Please Note:  You might notice some irritation and congestion in your nose or some drainage.  This is from the oxygen used during your procedure.  There is no need for concern and it should clear up in a day or so.  SYMPTOMS TO REPORT IMMEDIATELY:  Following lower endoscopy (colonoscopy):  Excessive amounts of blood in  the stool  Significant tenderness or worsening of abdominal pains  Swelling of the abdomen that is new, acute  Fever of 100F or higher  For urgent or emergent issues, a gastroenterologist can be reached at any hour by calling [phone]. Do not use MyChart messaging for urgent concerns.   DIET:  We do recommend a small meal at first, but then you may proceed to your regular diet.  Drink plenty of fluids but you should avoid alcoholic beverages for 24 hours.  ACTIVITY:  You should plan to take it easy for the rest of today and you should NOT DRIVE or use heavy machinery until tomorrow (because of the sedation medicines used during the test).    FOLLOW UP: Our staff will call the number listed on your records the next business day following your procedure.  We will call around 7:15- 8:00 am to check on you and address any questions or concerns that you may have regarding the information given to you following your procedure. If we do not reach you, we will leave a message.     If any biopsies were taken you will be contacted by phone or by letter within the next 1-3 weeks.  Please call us at [phone] if you have not heard about the biopsies in 3 weeks.    SIGNATURES/CONFIDENTIALITY: You and/or your care partner have signed paperwork which will be entered into your electronic medical record.  These signatures attest to the fact that that the information above  on your After Visit Summary has been reviewed and is understood.  Full responsibility of the confidentiality of this discharge information lies with you and/or your care-partner.

## 2023-03-27 ENCOUNTER — Telehealth: Payer: Self-pay

## 2023-03-27 NOTE — Telephone Encounter (Signed)
 No answer, left message to call if having any issues or concerns, B.Vale Mousseau RN

## 2023-03-28 ENCOUNTER — Other Ambulatory Visit: Payer: Self-pay

## 2023-04-05 ENCOUNTER — Other Ambulatory Visit: Payer: Medicare Other

## 2023-04-06 ENCOUNTER — Encounter: Payer: Self-pay | Admitting: Endocrinology

## 2023-04-06 LAB — RENAL FUNCTION PANEL
Albumin: 4.1 g/dL (ref 3.6–5.1)
BUN: 11 mg/dL (ref 7–25)
CO2: 27 mmol/L (ref 20–32)
Calcium: 9.1 mg/dL (ref 8.6–10.3)
Chloride: 102 mmol/L (ref 98–110)
Creat: 0.83 mg/dL (ref 0.70–1.28)
Glucose, Bld: 109 mg/dL — ABNORMAL HIGH (ref 65–99)
Phosphorus: 3.9 mg/dL (ref 2.1–4.3)
Potassium: 4.3 mmol/L (ref 3.5–5.3)
Sodium: 138 mmol/L (ref 135–146)

## 2023-04-06 LAB — HEMOGLOBIN A1C
Hgb A1c MFr Bld: 7.7 %{Hb} — ABNORMAL HIGH (ref <5.7)
Mean Plasma Glucose: 174 mg/dL
eAG (mmol/L): 9.7 mmol/L

## 2023-04-10 ENCOUNTER — Encounter: Payer: Self-pay | Admitting: Endocrinology

## 2023-04-10 ENCOUNTER — Ambulatory Visit (INDEPENDENT_AMBULATORY_CARE_PROVIDER_SITE_OTHER): Payer: Medicare Other | Admitting: Endocrinology

## 2023-04-10 VITALS — BP 128/60 | HR 58 | Ht 66.0 in | Wt 159.0 lb

## 2023-04-10 DIAGNOSIS — E1165 Type 2 diabetes mellitus with hyperglycemia: Secondary | ICD-10-CM

## 2023-04-10 DIAGNOSIS — Z7984 Long term (current) use of oral hypoglycemic drugs: Secondary | ICD-10-CM

## 2023-04-10 LAB — MICROALBUMIN / CREATININE URINE RATIO
Creatinine, Urine: 84 mg/dL (ref 20–320)
Microalb Creat Ratio: 15 mg/g{creat} (ref <30)
Microalb, Ur: 1.3 mg/dL

## 2023-04-10 MED ORDER — RYBELSUS 7 MG PO TABS
7.0000 mg | ORAL_TABLET | Freq: Every day | ORAL | 3 refills | Status: DC
Start: 2023-04-10 — End: 2023-08-10

## 2023-04-10 NOTE — Progress Notes (Signed)
 Outpatient Endocrinology Note [place] Izek Corvino, MD  04/10/23  Patient's Name: [name]    DOB: 09/07/1950    MRN: [id number]                                                    REASON OF VISIT: Follow up for type 2 diabetes mellitus  PCP: Tam Savoia, [place], MD  HISTORY OF PRESENT ILLNESS:   [name] is a 73 y.o. old male with past medical history listed below, is here for follow up of type 2 diabetes mellitus.   Pertinent Diabetes History: Patient was diagnosed with type 2 diabetes mellitus in 1996.  He has been on different antidiabetic medications including metformin, acarbose, Prandin, Invokana at different times in the past.  He has relatively controlled diabetes mellitus with hemoglobin A1c mostly in the range of 7s%.  Chronic Diabetes Complications : Retinopathy: yes, following with ophthalmology. Last ophthalmology exam was done on 10/2022. Every 6 months.  Nephropathy: no Peripheral neuropathy: yes, following with podiatry.  Coronary artery disease: no Stroke: no  Relevant comorbidities and cardiovascular risk factors: Obesity: no Body mass index is 25.66 kg/m.  Hypertension: yes Hyperlipidemia. Yes, on statin  Current / Home Diabetic regimen includes: Rybelsus 3 mg daily. Jardiance 25 mg daily. Metformin XR 1000 mg two times a day.  Prior diabetic medications: Actos : edema Rybelsus : lost too much weight on Rybelsus 7 mg.   Glycemic data:   He uses Livongo meter.  Forget to bring glucose data.  He recalls blood sugar mostly 1 19-1 40 range in the morning fasting.  Hypoglycemia: Patient has no hypoglycemic episodes. Patient has hypoglycemia awareness.  Factors modifying glucose control: 1.  Diabetic diet assessment: 3 meals a day.  2.  Staying active or exercising: Exercising 3 to 4 days a week.  3.  Medication compliance: compliant all of the time.  # Hypogonadism on testosterone supplement topical, managed by primary care provider.  Interval history   Hemoglobin A1c is worsening 7.7%.  He has been taking Rybelsus and tolerating well.  Diabetes regimen as reviewed and noted above.  Body weight has remained stable.  He reports he has been eating bedtime snacks lately.  No other complaints today.  REVIEW OF SYSTEMS As per history of present illness.   PAST MEDICAL HISTORY: Past Medical History:  Diagnosis Date   Arthritis    right shoulder   BBB (bundle branch block)    BPH (benign prostatic hyperplasia)    Hyperlipidemia    Hypertension    Impotence of organic origin    Iron deficiency anemia    Mild atherosclerosis of carotid artery, bilateral    last doppler 12-17-2010   bilateral ICA 1-39%   Nocturia more than twice per night    Testicular hypofunction    Type 2 diabetes mellitus [facility])    endocrinologist-- dr Merri Ray urinary stream    Wears glasses     PAST SURGICAL HISTORY: Past Surgical History:  Procedure Laterality Date   COLONOSCOPY     CYSTOSCOPY WITH INSERTION OF UROLIFT N/A 12/12/2017   Procedure: CYSTOSCOPY WITH INSERTION OF UROLIFT;  Surgeon: Jerilee Field, MD;  Location: [facility] [place];  Service: Urology;  Laterality: N/A;   SHOULDER SURGERY Right 2004, 1990   WISDOM TOOTH EXTRACTION      ALLERGIES: Allergies  Allergen Reactions   Trandolapril Swelling     w/ Angioedema   Actos [Pioglitazone] Swelling    edema   Azithromycin Rash   Other Rash    MAGIC MOUTHWASH   Tetracycline Rash    FAMILY HISTORY:  Family History  Problem Relation Age of Onset   Heart disease Mother 24       Deceased   Heart attack Mother    Hypertension Mother    Diabetes Father 63       Deceased   Hypertension Father    Heart disease Sister        heart trouble   Heart attack Sister    Healthy Sister        x6   Heart attack Sister    Diabetes Brother    Heart attack Brother    Cancer Paternal Aunt        x1-Bone   Cancer Paternal Aunt        multiple   Heart attack Maternal Grandmother     Colon cancer Neg Hx    Esophageal cancer Neg Hx    Liver cancer Neg Hx    Stomach cancer Neg Hx    Pancreatic cancer Neg Hx    Colon polyps Neg Hx    Rectal cancer Neg Hx     SOCIAL HISTORY: Social History   Socioeconomic History   Marital status: Married    Spouse name: [name]   Number of children: 0   Years of education: Not on file   Highest education level: Not on file  Occupational History    Employer: [employer]  Tobacco Use   Smoking status: Never   Smokeless tobacco: Never  Vaping Use   Vaping status: Never Used  Substance and Sexual Activity   Alcohol use: Not Currently    Alcohol/week: 0.0 standard drinks of alcohol   Drug use: No   Sexual activity: Yes  Other Topics Concern   Not on file  Social History Narrative   Regular exercise-yes   Caffeine-2 cups   Married=wife [name] 22 years   Cigar Smoker-quit smoking Jan 2010   Social Drivers of Corporate investment banker Strain: Not on file  Food Insecurity: Not on file  Transportation Needs: Not on file  Physical Activity: Not on file  Stress: Not on file  Social Connections: Not on file    MEDICATIONS:  Current Outpatient Medications  Medication Sig Dispense Refill   acyclovir (ZOVIRAX) 800 MG tablet Take 800 mg by mouth daily as needed (for flare-up).     aspirin 81 MG tablet Take 81 mg by mouth daily.     atenolol (TENORMIN) 50 MG tablet Take 1 tablet (50 mg total) by mouth daily. 30 tablet 0   cetirizine (ZYRTEC) 10 MG tablet Take 10 mg by mouth as needed.     diltiazem (CARDIZEM) 30 MG tablet Take 1 tablet (30 mg total) by mouth 4 (four) times daily as needed (elevated HR). 10 tablet 1   empagliflozin (JARDIANCE) 25 MG TABS tablet Take 1 tablet (25 mg total) by mouth daily. 90 tablet 3   FREESTYLE LITE test strip USE 1 STRIP DAILY TO MONITOR GLUCOSE LEVELS 100 strip 3   Lancets (FREESTYLE) lancets 1 each by Other route daily. Use to monitor glucose levels daily; E11.9 100 each 2    losartan (COZAAR) 50 MG tablet      meclizine (ANTIVERT) 12.5 MG tablet Take 1 tablet (12.5 mg total) by mouth 2 (  two) times daily as needed for dizziness. 10 tablet 0   meloxicam (MOBIC) 15 MG tablet Take 1 tablet (15 mg total) by mouth daily as needed for pain. 30 tablet 0   metFORMIN (GLUCOPHAGE-XR) 500 MG 24 hr tablet TAKE 2 TABLETS(1000 MG) BY MOUTH TWICE DAILY WITH A MEAL 360 tablet 4   Multiple Vitamin (MULTIVITAMIN) tablet Take 1 tablet by mouth daily.     olopatadine (PATANOL) 0.1 % ophthalmic solution Apply 1 drop to eye daily as needed (for itchy and dry eyes).     omeprazole (PRILOSEC) 20 MG capsule Take 1 capsule (20 mg total) by mouth 2 (two) times daily before a meal. 60 capsule 3   rosuvastatin (CRESTOR) 20 MG tablet TAKE 1 TABLET(20 MG) BY MOUTH DAILY 90 tablet 3   Semaglutide (RYBELSUS) 3 MG TABS Take 1 tablet daily 30 minutes before breakfast with water 90 tablet 3   Semaglutide (RYBELSUS) 7 MG TABS Take 1 tablet (7 mg total) by mouth daily. 90 tablet 3   sildenafil (VIAGRA) 100 MG tablet Take 100 mg by mouth daily as needed for erectile dysfunction. Use as directed     triamcinolone (KENALOG) 0.1 % Apply 1 application  topically at bedtime.     XYOSTED 75 MG/0.5ML SOAJ Inject 75 mg as directed once a week.     ezetimibe (ZETIA) 10 MG tablet Take 1 tablet (10 mg total) by mouth daily. 90 tablet 3   No current facility-administered medications for this visit.    PHYSICAL EXAM: Vitals:   04/10/23 0824  BP: 128/60  Pulse: (!) 58  SpO2: 98%  Weight: 159 lb (72.1 kg)  Height: 5\' 6"  (1.676 m)    Body mass index is 25.66 kg/m.  Wt Readings from Last 3 Encounters:  04/10/23 159 lb (72.1 kg)  03/24/23 154 lb (69.9 kg)  03/06/23 154 lb (69.9 kg)    General: Well developed, well nourished male in no apparent distress.  HEENT: AT/, no external lesions.  Eyes: Conjunctiva clear and no icterus. Neck: Neck supple  Lungs: Respirations not labored Neurologic: Alert,  oriented, normal speech Extremities / Skin: Dry.   Psychiatric: Does not appear depressed or anxious   Diabetic Foot Exam - Simple   No data filed    LABS Reviewed Lab Results  Component Value Date   HGBA1C 7.7 (H) 04/05/2023   HGBA1C 6.9 (A) 12/09/2022   HGBA1C 7.6 (H) 09/01/2022   Lab Results  Component Value Date   FRUCTOSAMINE 239 10/03/2019   Lab Results  Component Value Date   CHOL 148 06/13/2022   HDL 49 06/13/2022   LDLCALC 85 06/13/2022   LDLDIRECT 146.5 10/29/2012   TRIG 73 06/13/2022   CHOLHDL 3.0 06/13/2022   Lab Results  Component Value Date   MICRALBCREAT 1.7 05/13/2022   MICRALBCREAT 1.1 04/30/2021   Lab Results  Component Value Date   CREATININE 0.83 04/05/2023   Lab Results  Component Value Date   GFR 77.91 09/01/2022    ASSESSMENT / PLAN  1. Uncontrolled type 2 diabetes mellitus with hyperglycemia, without long-term current use of insulin (HCC)     Diabetes Mellitus type 2, complicated by diabetic neuropathy. - Diabetic status / severity: Controlled  Lab Results  Component Value Date   HGBA1C 7.7 (H) 04/05/2023    - Hemoglobin A1c goal : <6.5%  Discussed about managing diet and avoiding bedtime snacks.  Increase Rybelsus as follows.  - Medications: No change. Rybelsus 3 mg daily, increase to 7 mg  daily. Jardiance 25 mg daily. Metformin XR 1000 mg two times a day.   - Home glucose testing: Daily in the morning fasting and at bedtime few times a week. - Discussed/ Gave Hypoglycemia treatment plan.  # Consult : not required at this time.   # Annual urine for microalbuminuria/ creatinine ratio, no microalbuminuria currently, continue ACE/ARB /losartan.  Waiting for result of urine microalbumin creatinine ratio today. Last  Lab Results  Component Value Date   MICRALBCREAT 1.7 05/13/2022    # Foot check nightly / neuropathy.  Patient is also following with podiatry.  He has diabetic shoes.  He had changes with diabetic  neuropathy with callus and bunions.  He will benefit from diabetic shoes and inserts.  # Annual dilated diabetic eye exams.  He has retinopathy.  Following with ophthalmology every 6 months.  - Diet: Make healthy diabetic food choices - Life style / activity / exercise: Discussed.  2. Blood pressure  -  BP Readings from Last 1 Encounters:  04/10/23 128/60    - Control is in target.  - No change in current plans.  3. Lipid status / Hyperlipidemia - Last  Lab Results  Component Value Date   LDLCALC 85 06/13/2022   - Continue rosuvastatin 20 mg daily.  Managed by primary care provider.  Diagnoses and all orders for this visit:  Uncontrolled type 2 diabetes mellitus with hyperglycemia, without long-term current use of insulin (HCC) -     Semaglutide (RYBELSUS) 7 MG TABS; Take 1 tablet (7 mg total) by mouth daily.    DISPOSITION Follow up in clinic in 4 months suggested.   All questions answered and patient verbalized understanding of the plan.  [place] Latosha Gaylord, MD [facility] [address] [place], [address] [place], [place] [address] Phone # [phone]  At least part of this note was generated using voice recognition software. Inadvertent word errors may have occurred, which were not recognized during the proofreading process.

## 2023-04-11 ENCOUNTER — Telehealth: Payer: Self-pay

## 2023-04-11 NOTE — Telephone Encounter (Signed)
 Patient dropped off refurbished orthotics to have scaphoid pad removed as he cannot tolerate

## 2023-05-03 ENCOUNTER — Telehealth: Payer: Self-pay

## 2023-05-03 NOTE — Telephone Encounter (Signed)
 Patient called stating he is seeing if his orthotics are fixed and ready for pick up

## 2023-05-23 ENCOUNTER — Other Ambulatory Visit: Payer: Self-pay

## 2023-05-23 MED ORDER — EZETIMIBE 10 MG PO TABS: 10.0000 mg | ORAL_TABLET | Freq: Every day | ORAL | 0 refills | Status: DC

## 2023-07-06 ENCOUNTER — Ambulatory Visit: Attending: Internal Medicine | Admitting: Internal Medicine

## 2023-07-06 ENCOUNTER — Encounter: Payer: Self-pay | Admitting: Internal Medicine

## 2023-07-06 VITALS — BP 118/50 | HR 55 | Ht 67.0 in | Wt 153.0 lb

## 2023-07-06 DIAGNOSIS — E1169 Type 2 diabetes mellitus with other specified complication: Secondary | ICD-10-CM | POA: Diagnosis not present

## 2023-07-06 DIAGNOSIS — E785 Hyperlipidemia, unspecified: Secondary | ICD-10-CM

## 2023-07-06 DIAGNOSIS — I340 Nonrheumatic mitral (valve) insufficiency: Secondary | ICD-10-CM

## 2023-07-06 DIAGNOSIS — E1159 Type 2 diabetes mellitus with other circulatory complications: Secondary | ICD-10-CM | POA: Diagnosis not present

## 2023-07-06 DIAGNOSIS — I471 Supraventricular tachycardia, unspecified: Secondary | ICD-10-CM

## 2023-07-06 DIAGNOSIS — I152 Hypertension secondary to endocrine disorders: Secondary | ICD-10-CM

## 2023-07-06 NOTE — Patient Instructions (Signed)
 Medication Instructions:  Your physician recommends that you continue on your current medications as directed. Please refer to the Current Medication list given to you today.  *If you need a refill on your cardiac medications before your next appointment, please call your pharmacy*  Lab Work: AT any [facility]: Fasting lipid panel   If you have labs (blood work) drawn today and your tests are completely normal, you will receive your results only by: MyChart Message (if you have MyChart) OR A paper copy in the mail If you have any lab test that is abnormal or we need to change your treatment, we will call you to review the results.  Testing/Procedures: Your physician has requested that you have an echocardiogram. Echocardiography is a painless test that uses sound waves to create images of your heart. It provides your doctor with information about the size and shape of your heart and how well your heart's chambers and valves are working. This procedure takes approximately one hour. There are no restrictions for this procedure. Please do NOT wear cologne, perfume, aftershave, or lotions (deodorant is allowed). Please arrive 15 minutes prior to your appointment time.  Please note: We ask at that you not bring children with you during ultrasound (echo/ vascular) testing. Due to room size and safety concerns, children are not allowed in the ultrasound rooms during exams. Our front office staff cannot provide observation of children in our lobby area while testing is being conducted. An adult accompanying a patient to their appointment will only be allowed in the ultrasound room at the discretion of the ultrasound technician under special circumstances. We apologize for any inconvenience.   Follow-Up: At [facility], you and your health needs are our priority.  As part of our continuing mission to provide you with exceptional heart care, our providers are all part of one team.  This team  includes your primary Cardiologist (physician) and Advanced Practice Providers or APPs (Physician Assistants and Nurse Practitioners) who all work together to provide you with the care you need, when you need it.  Your next appointment:   1 year(s)  Provider:   Olivia Pavy, PA-C      Then, Stanly DELENA Leavens, MD will plan to see you again in 2 year(s).

## 2023-07-06 NOTE — Progress Notes (Signed)
 Cardiology Office Note:  .    Date:  07/06/2023  ID:  [name], DOB Jun 04, 1950, MRN [id number] PCP: [place], [place], MD  [facility] Providers Cardiologist:  Stanly DELENA Leavens, MD     CC: Follow up CAD and prevention  History of Present Illness: .    [name] is a 73 y.o. male  with coronary artery disease, hyperlipidemia, and hypertension who presents for aggressive secondary prevention. He was referred by Dr. Morris for research and aggressive secondary prevention.  He has a history of nonobstructive coronary artery disease, hyperlipidemia, and hypertension. He is currently on aspirin for secondary prevention and rosuvastatin  20 mg daily, although his LDL was above goal last year. He is also on atenolol  for supraventricular tachycardia and blood pressure control, and takes losartan 50 mg daily with well-controlled blood pressure. He has not needed to use diltiazem  for palpitations.  He has mild mitral regurgitation, which was last assessed in 2022. He also has a history of left bundle branch block and asymptomatic paroxysmal supraventricular tachycardia, as well as first-degree heart block. His EKG shows first-degree heart block and left bundle branch block, similar to previous findings.  He has type 2 diabetes and is on Jardiance , with his A1c under 8. His blood sugar levels have been fluctuating, and he is trying to control his eating habits, particularly late-night snacking. He often eats only two meals a day due to a lack of hunger and a later breakfast time since he no longer works.  He has a significant family history of heart disease, with his mother, grandmother, father, sister, and brother all having experienced heart problems or heart attacks.  Socially, he participates in an exercise class for older adults four days a week, which includes weight lifting and jogging in place. He started this class earlier this year and is enjoying it.  No symptoms related to  his heart rhythm issues.  Discussed the use of AI scribe software for clinical note transcription with the patient, who gave verbal consent to proceed.   Relevant histories: .  Social  2022: Two ED visits and has been diagnosed with vertigo.   Had ZioPatch and Echocardiogram with mild MR.LDL down to 74.  2023: No cardiac symptoms 2024: CCTA moderate non obstructive CAD; aims for more aggressive LDL goal. Started Research. Saw Michele Lenze  ROS: As per HPI.   Studies Reviewed: .     Cardiac Studies & Procedures   ______________________________________________________________________________________________     ECHOCARDIOGRAM  ECHOCARDIOGRAM COMPLETE 01/22/2020  Narrative ECHOCARDIOGRAM REPORT    Patient Name:   [name] Date of Exam: 01/22/2020 Medical Rec #:  [id number]      Height:       66.5 in Accession #:    [id number]     Weight:       157.6 lb Date of Birth:  29-Oct-1950      BSA:          1.817 m Patient Age:    69 years       BP:           126/88 mmHg Patient Gender: M              HR:           66 bpm. Exam Location:  [address]  Procedure: 2D Echo, Cardiac Doppler and Color Doppler  Indications:    R01.1 Murmur; R07.9* Chest pain, unspecified  History:        Patient has no  prior history of Echocardiogram examinations. Risk Factors:Hypertension, Diabetes and Dyslipidemia. Palpitations. Edema. Fibromyalgia.  Sonographer:    Jon Hacker RCS Referring Phys: [id number] [facility] A Ruthellen Tippy  IMPRESSIONS   1. Left ventricular ejection fraction, by estimation, is 60 to 65%. The left ventricle has normal function. The left ventricle has no regional wall motion abnormalities. There is mild left ventricular hypertrophy. Left ventricular diastolic parameters were normal. 2. Right ventricular systolic function is normal. The right ventricular size is normal. 3. The mitral valve is abnormal. Mild mitral valve regurgitation. No evidence of mitral  stenosis. 4. The aortic valve is tricuspid. There is moderate calcification of the aortic valve. There is moderate thickening of the aortic valve. Aortic valve regurgitation is not visualized. Mild to moderate aortic valve sclerosis/calcification is present, without any evidence of aortic stenosis. 5. The inferior vena cava is normal in size with greater than 50% respiratory variability, suggesting right atrial pressure of 3 mmHg.  FINDINGS Left Ventricle: Left ventricular ejection fraction, by estimation, is 60 to 65%. The left ventricle has normal function. The left ventricle has no regional wall motion abnormalities. The left ventricular internal cavity size was normal in size. There is mild left ventricular hypertrophy. Left ventricular diastolic parameters were normal.  Right Ventricle: The right ventricular size is normal. No increase in right ventricular wall thickness. Right ventricular systolic function is normal.  Left Atrium: Left atrial size was normal in size.  Right Atrium: Right atrial size was normal in size.  Pericardium: There is no evidence of pericardial effusion.  Mitral Valve: The mitral valve is abnormal. There is mild thickening of the mitral valve leaflet(s). Mild mitral valve regurgitation. No evidence of mitral valve stenosis.  Tricuspid Valve: The tricuspid valve is normal in structure. Tricuspid valve regurgitation is trivial. No evidence of tricuspid stenosis.  Aortic Valve: The aortic valve is tricuspid. There is moderate calcification of the aortic valve. There is moderate thickening of the aortic valve. There is moderate aortic valve annular calcification. Aortic valve regurgitation is not visualized. Mild to moderate aortic valve sclerosis/calcification is present, without any evidence of aortic stenosis.  Pulmonic Valve: The pulmonic valve was normal in structure. Pulmonic valve regurgitation is mild. No evidence of pulmonic stenosis.  Aorta: The aortic  root is normal in size and structure.  Venous: The inferior vena cava is normal in size with greater than 50% respiratory variability, suggesting right atrial pressure of 3 mmHg.  IAS/Shunts: No atrial level shunt detected by color flow Doppler.   LEFT VENTRICLE PLAX 2D LVIDd:         3.80 cm  Diastology LVIDs:         2.00 cm  LV e' medial:    5.91 cm/s LV PW:         1.40 cm  LV E/e' medial:  16.8 LV IVS:        1.20 cm  LV e' lateral:   6.87 cm/s LVOT diam:     2.00 cm  LV E/e' lateral: 14.5 LV SV:         58 LV SV Index:   32 LVOT Area:     3.14 cm   RIGHT VENTRICLE RV Basal diam:  2.00 cm RV S prime:     12.00 cm/s TAPSE (M-mode): 1.5 cm  LEFT ATRIUM             Index       RIGHT ATRIUM           Index  LA diam:        3.10 cm 1.71 cm/m  RA Area:     10.00 cm LA Vol (A2C):   46.9 ml 25.81 ml/m RA Volume:   17.70 ml  9.74 ml/m LA Vol (A4C):   36.4 ml 20.03 ml/m LA Biplane Vol: 45.1 ml 24.82 ml/m AORTIC VALVE LVOT Vmax:   90.80 cm/s LVOT Vmean:  54.800 cm/s LVOT VTI:    0.184 m  AORTA Ao Root diam: 3.10 cm  MITRAL VALVE MV Area (PHT): 4.87 cm     SHUNTS MV Decel Time: 156 msec     Systemic VTI:  0.18 m MV E velocity: 99.40 cm/s   Systemic Diam: 2.00 cm MV A velocity: 104.00 cm/s MV E/A ratio:  0.96  Maude Emmer MD Electronically signed by Maude Emmer MD Signature Date/Time: 01/22/2020/10:08:08 AM    Final    MONITORS  LONG TERM MONITOR (3-14 DAYS) 02/07/2020  Narrative  Patient had a minimum heart rate of 51 bpm, maximum heart rate of 156 bpm, and average heart rate of 74 bpm.  Predominant underlying rhythm was sinus rhythm.  Twelve runs of supraventricular tachycardia occurred lasting 15 beats at longest with a max rate of 156 bpm at fastest.  Isolated PACs were rare (<1.0%), with rare couplets and triplets present.  Isolated PVCs were rare (<1.0%), with rare couplets and triplets present.  Bundle Branch Block/IVCD was present.  No  triggered and diary events.  No malignant arrhythmias.   CT SCANS  CT CORONARY MORPH W/CTA COR W/SCORE 06/09/2022  Addendum 06/17/2022 10:28 AM ADDENDUM REPORT: 06/17/2022 10:26  EXAM: OVER-READ INTERPRETATION  CT CHEST  The following report is an over-read performed by radiologist Dr. Andrea Gasman of [facility] on 06/17/2022. This over-read does not include interpretation of cardiac or coronary anatomy or pathology. The coronary CTA interpretation by the cardiologist is attached.  COMPARISON:  None.  FINDINGS: Vascular: Mild aortic atherosclerosis. The included aorta is normal in caliber.  Mediastinum/nodes: No adenopathy or mass. Unremarkable esophagus.  Lungs: No focal airspace disease. No pulmonary nodule. No pleural fluid. The included airways are patent.  Upper abdomen: No acute or unexpected findings.  Musculoskeletal: There are no acute or suspicious osseous abnormalities.  IMPRESSION: Aortic Atherosclerosis (ICD10-I70.0).   Electronically Signed By: Andrea Gasman M.D. On: 06/17/2022 10:26  Narrative CLINICAL DATA:  This is a 73 year old male with anginal symptoms.  EXAM: Cardiac/Coronary  CTA  TECHNIQUE: The patient was scanned on a [employer].  FINDINGS: A 100 kV prospective scan was triggered in the descending thoracic aorta at 111 HU's. Axial non-contrast 3 mm slices were carried out through the heart. The data set was analyzed on a dedicated work station and scored using the Agatson method. Gantry rotation speed was 250 msecs and collimation was .6 mm. No beta blockade and 0.8 mg of sl NTG was given. The 3D data set was reconstructed in 5% intervals of the 67-82 % of the R-R cycle. Diastolic phases were analyzed on a dedicated work station using MPR, MIP and VRT modes. The patient received 80 cc of contrast.  Image quality: Fair with misregistration artifact.  Aorta: Normal size.  No calcifications.  No  dissection.  Aortic Valve:  Trileaflet.  No calcifications.  Coronary Arteries:  Normal coronary origin.  Right dominance.  RCA is a large dominant artery that gives rise to PDA and PLA. Due to artifact unable to visualize the RCA.  Left main is a large  artery that gives rise to LAD and LCX arteries.  LAD is a large vessel. There Proximal to mid LAD with a moderate (50-69%) calcified plaque. The mid LAD with mild (25-49%) calcified plaque. The distal LAD with no apparent plaques. D1 is a small vessel with a focal plaque in the proximal portion of the vessel that is not quantifiable due to size to the vessel and lumen not fully visualized.  LCX is a non-dominant artery that gives rise to one large OM1 branch. LCX with a mild plaque in the proximal portion of the vessel.  Coronary Calcium  Score:  Left main: 0  Left anterior descending artery: 367  Left circumflex artery: 86.2  Right coronary artery: 0  Total: 453  Percentile: 87  Other findings:  Normal pulmonary vein drainage into the left atrium.  Normal left atrial appendage without a thrombus.  Normal size of the pulmonary artery.  IMPRESSION: 1. Coronary calcium  score of 453. This was 68 percentile for age and sex matched control.  2. Normal coronary origin with right dominance.  3. CAD-RADS 3. Moderate stenosis. Consider symptom-guided anti-ischemic pharmacotherapy as well as risk factor modification per guideline directed care. Additional analysis with CT FFR will be submitted.  The noncardiac portion of this study will be interpreted in separate report by the radiologist.  Electronically Signed: By: Kardie  Tobb D.O. On: 06/09/2022 11:50     ______________________________________________________________________________________________        Physical Exam:    VS:  BP (!) 118/50 (BP Location: Left Arm)   Pulse (!) 55   Ht 5' 7 (1.702 m)   Wt 153 lb (69.4 kg)   SpO2 97%   BMI 23.96  kg/m    Wt Readings from Last 3 Encounters:  07/06/23 153 lb (69.4 kg)  04/10/23 159 lb (72.1 kg)  03/24/23 154 lb (69.9 kg)    Gen: no distress   Neck: No JVD Cardiac: No Rubs or Gallops, no Murmur, regular bradycardia, +2 radial pulses Respiratory: Clear to auscultation bilaterally, normal effort, normal  respiratory rate GI: Soft, nontender, non-distended  MS: No  edema;  moves all extremities Integument: Skin feels warm Neuro:  At time of evaluation, alert and oriented to person/place/time/situation Psych: Normal affect, patient feels ok   ASSESSMENT AND PLAN: .     An EKG was ordered for SVT and shows stable LBBB and 1st HB from out prior office  Coronary artery disease Nonobstructive coronary artery disease with plaque buildup on CT scan. Asymptomatic and well-managed. Family history of coronary artery disease necessitates aggressive secondary prevention. - Order fasting lipid panel - Discuss aggressive LDL goal (if LDL is above goal I will offer him either recheck in 3 months with dietary changes we discussed or increase in statin) - Continue aspirin for secondary prevention  Hyperlipidemia with T2DM LDL was above goal last year. Aggressive cholesterol management is necessary due to plaque presence. - Order fasting lipid panel - Consider increasing rosuvastatin  dose if LDL remains above goal - Recheck cholesterol in three months if needed - A1c is under 8 with Jardiance  and semaglutide . Discussed dietary habits and meal timing challenges. Encouraged lifestyle modifications for improved glycemic control.  Hypertension Blood pressure is well-controlled with losartan and atenolol .  Mild mitral regurgitation Mild mitral regurgitation noted in 2022. No significant progression expected. - Order echocardiogram to assess mitral regurgitation  Paroxysmal supraventricular tachycardia Asymptomatic with current regimen. No episodes requiring diltiazem  since last visit.  Well-managed with atenolol .  Left bundle branch block First  degree heart block - can continue BB; No symptoms related to conduction abnormalities.  Longitudinal care: The evaluation and management services provided today reflect the complexity inherent in caring for this patient, including the ongoing longitudinal relationship and management of multiple chronic conditions and/or the need for care coordination. The visit required a comprehensive assessment and management plan tailored to the patient's unique needs Time was spent addressing not only the acute concerns but also the broader context of the patient's health, including preventive care, chronic disease management, and care coordination as appropriate.  Complex longitudinal is necessary for conditions including: holistic secondary prevention of CAD given his family history   Follow-up Plan for follow-up visits to monitor and manage cardiovascular health and secondary prevention strategies. - Schedule follow-up with Rosaline Pavy in one year - Schedule follow-up with cardiologist in two years    Stanly Leavens, MD FASE [facility] Cardiologist [facility]  [address] [place], [address] [place], [place] [address] [phone]  9:30 AM

## 2023-07-07 ENCOUNTER — Ambulatory Visit: Payer: Self-pay | Admitting: Internal Medicine

## 2023-07-07 LAB — LIPID PANEL
Chol/HDL Ratio: 2.6 ratio (ref 0.0–5.0)
Cholesterol, Total: 86 mg/dL — ABNORMAL LOW (ref 100–199)
HDL: 33 mg/dL — ABNORMAL LOW (ref >39)
LDL Chol Calc (NIH): 39 mg/dL (ref 0–99)
Triglycerides: 63 mg/dL (ref 0–149)
VLDL Cholesterol Cal: 14 mg/dL (ref 5–40)

## 2023-07-24 ENCOUNTER — Telehealth (INDEPENDENT_AMBULATORY_CARE_PROVIDER_SITE_OTHER): Payer: Self-pay | Admitting: Otolaryngology

## 2023-07-24 ENCOUNTER — Encounter (INDEPENDENT_AMBULATORY_CARE_PROVIDER_SITE_OTHER): Payer: Self-pay | Admitting: Otolaryngology

## 2023-07-24 ENCOUNTER — Ambulatory Visit (INDEPENDENT_AMBULATORY_CARE_PROVIDER_SITE_OTHER): Admitting: Otolaryngology

## 2023-07-24 VITALS — BP 133/68 | HR 60

## 2023-07-24 DIAGNOSIS — H6122 Impacted cerumen, left ear: Secondary | ICD-10-CM

## 2023-07-24 DIAGNOSIS — R42 Dizziness and giddiness: Secondary | ICD-10-CM

## 2023-07-24 DIAGNOSIS — H8102 Meniere's disease, left ear: Secondary | ICD-10-CM

## 2023-07-24 DIAGNOSIS — H9042 Sensorineural hearing loss, unilateral, left ear, with unrestricted hearing on the contralateral side: Secondary | ICD-10-CM

## 2023-07-24 NOTE — Progress Notes (Unsigned)
 Patient ID: [name], male   DOB: 01/16/1950, 74 y.o.   MRN: [id number]  Follow-up: Recurrent dizziness, left ear Mnire's disease, left ear hearing loss  HPI:  The patient is a 73 year old male who returns today for his follow-up evaluation. The patient has a history of recurrent dizziness and sudden left-sided hearing loss.  His brain MRI scan was negative.  He was diagnosed with left ear Mnire's disease. He was treated with low-salt diet and left chemical labyrinthotomy with dexamethasone  infusion.  At his last visit in August 2024, his left ear Mnire's disease was under control.  He was noted to have asymmetric left ear sensorineural hearing loss.  He was continued on his low-salt diet.  The patient returns today reporting 2 episodes of mild dizziness over the past year.  He still has left ear hearing difficulty, but denies any recent change in his hearing.  Currently he denies any otalgia, otorrhea, or vertigo.  Exam: General: Communicates without difficulty, well nourished, no acute distress. Head: Normocephalic, no evidence injury, no tenderness, facial buttresses intact without stepoff. Eyes: PERRL, EOMI. No scleral icterus, conjunctivae clear. Neuro: CN II exam reveals vision grossly intact. No nystagmus at any point of gaze. Ears: Auricles well formed without lesions.  Left ear cerumen impaction.  The left tube has extruded into the ear canal, and is encased within the cerumen.  The right TM is intact without fluid. Nose: External evaluation reveals normal support and skin without lesions. Dorsum is intact. Anterior rhinoscopy reveals healthy pink mucosa over anterior aspect of inferior turbinates and intact septum. No purulence noted. Oral:  Oral cavity and oropharynx are intact, symmetric, without erythema or edema. Mucosa is moist without lesions. Neck: Full range of motion without pain. There is no significant lymphadenopathy. No masses palpable. Thyroid  bed within normal limits to  palpation. Parotid glands and submandibular glands equal bilaterally without mass. Trachea is midline. Neuro:  CN 2-12 grossly intact. Gait normal. Vestibular: No nystagmus at any point of gaze. The cerebellar examination is unremarkable.   Procedure: Left ear cerumen disimpaction Anesthesia: None Description: Under the operating microscope, the cerumen is carefully removed with a combination of cerumen currette, alligator forceps, and suction catheters.  The extruded tube is also removed.  After the cerumen is removed, the TMs are noted to be normal.  No mass, erythema, or lesions. The patient tolerated the procedure well.   Assessment:  1.  Recurrent dizziness.  The patient's left ear Mnire's disease is currently under control. 2.  Left ear cerumen impaction.  The left ear ventilating tube has extruded into the ear canal, and is encased within the cerumen.  After the cerumen and tube removal procedure, both tympanic membranes are noted to be intact and mobile. 3.  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  His previous MRI scan was negative.  Plan: 1.  The physical exam findings are reviewed with the patient. 2.  Otomicroscopy with left ear cerumen disimpaction and tube removal. 3.  Continue with 1500 mg low-salt diet. 4.  Continue the use of his hearing aids. 5.  The clinical course and pathophysiology of Mnire's disease are discussed with the patient. 6.  The patient will return for reevaluation in 1 year, sooner if needed.

## 2023-07-24 NOTE — Telephone Encounter (Signed)
 07/24/23 Patient called to get an earlier appt w/[facility]. Patient went to get hearing aids, but unable to due to tubes is 1/2 out but embedded in wax. Hearing aid facility advise patient to make appt with ENT provider soon as possible to remove.

## 2023-07-24 NOTE — Telephone Encounter (Signed)
 Returned patient 's phone call. Left him a voice mail. Made him an appointment for today 07/24/23 at 2 pm.

## 2023-07-25 DIAGNOSIS — H9042 Sensorineural hearing loss, unilateral, left ear, with unrestricted hearing on the contralateral side: Secondary | ICD-10-CM | POA: Insufficient documentation

## 2023-07-25 DIAGNOSIS — H6122 Impacted cerumen, left ear: Secondary | ICD-10-CM | POA: Insufficient documentation

## 2023-08-10 ENCOUNTER — Encounter: Payer: Self-pay | Admitting: Endocrinology

## 2023-08-10 ENCOUNTER — Ambulatory Visit (INDEPENDENT_AMBULATORY_CARE_PROVIDER_SITE_OTHER): Admitting: Endocrinology

## 2023-08-10 ENCOUNTER — Ambulatory Visit: Payer: Self-pay | Admitting: Endocrinology

## 2023-08-10 VITALS — BP 118/62 | HR 64 | Resp 20 | Ht 67.0 in | Wt 154.6 lb

## 2023-08-10 DIAGNOSIS — Z794 Long term (current) use of insulin: Secondary | ICD-10-CM

## 2023-08-10 DIAGNOSIS — E1165 Type 2 diabetes mellitus with hyperglycemia: Secondary | ICD-10-CM

## 2023-08-10 LAB — POCT GLYCOSYLATED HEMOGLOBIN (HGB A1C): Hemoglobin A1C: 7.5 % — AB (ref 4.0–5.6)

## 2023-08-10 MED ORDER — RYBELSUS 3 MG PO TABS: 3.0000 mg | ORAL_TABLET | Freq: Every day | ORAL | 3 refills | Status: AC

## 2023-08-10 MED ORDER — EMPAGLIFLOZIN 25 MG PO TABS: 25.0000 mg | ORAL_TABLET | Freq: Every day | ORAL | 3 refills | Status: AC

## 2023-08-10 MED ORDER — METFORMIN HCL ER 500 MG PO TB24: 1000.0000 mg | ORAL_TABLET | Freq: Two times a day (BID) | ORAL | 4 refills | Status: AC

## 2023-08-10 NOTE — Progress Notes (Signed)
 Outpatient Endocrinology Note [place] Derwood Becraft, MD  08/10/23  Patient's Name: [name]    DOB: 08-May-1950    MRN: [id number]                                                    REASON OF VISIT: Follow up for type 2 diabetes mellitus  PCP: Shyheem Whitham, [place], MD  HISTORY OF PRESENT ILLNESS:   Johanna Stafford is a 73 y.o. old male with past medical history listed below, is here for follow up of type 2 diabetes mellitus.   Pertinent Diabetes History: Patient was diagnosed with type 2 diabetes mellitus in 1996.  He has been on different antidiabetic medications including metformin , acarbose , Prandin , Invokana  at different times in the past.  He has relatively controlled diabetes mellitus with hemoglobin A1c mostly in the range of 7s%.  Chronic Diabetes Complications : Retinopathy: yes, following with ophthalmology. Last ophthalmology exam was done on 10/2022. Every 6 months.  Nephropathy: no Peripheral neuropathy: yes, following with podiatry.  Coronary artery disease: no Stroke: no  Relevant comorbidities and cardiovascular risk factors: Obesity: no Body mass index is 24.21 kg/m.  Hypertension: yes Hyperlipidemia. Yes, on statin  Current / Home Diabetic regimen includes: Rybelsus  3 mg daily. Jardiance  25 mg daily. Metformin  XR 1000 mg two times a day.  Prior diabetic medications: Actos  : edema Rybelsus  : lost too much weight on Rybelsus  7 mg.   Glycemic data:   Not able to download glucometer in the clinic today, blood sugar directly reviewed from the meter.  Some of the blood sugars are as follows.  Occasional hyperglycemia.    118, 186, 155, 127, 135, 113, 153, 136, 140  Hypoglycemia: Patient has no hypoglycemic episodes. Patient has hypoglycemia awareness.  Factors modifying glucose control: 1.  Diabetic diet assessment: 3 meals a day.  2.  Staying active or exercising: Exercising 3 to 4 days a week.  3.  Medication compliance: compliant all of the time.  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  Interval history  Patient continued to take Rybelsus  3 mg daily, did not want to increase to 7 mg due to concern of significant weight loss.  Glucometer to test reviewed above.  Hemoglobin A1c today 7.5%.  He denies numbness and ting of the feet.  He occasionally gets muscle cramps in the leg otherwise no complaints today.  REVIEW OF SYSTEMS As per history of present illness.   PAST MEDICAL HISTORY: Past Medical History:  Diagnosis Date   Arthritis    right shoulder   BBB (bundle branch block)    BPH (benign prostatic hyperplasia)    Hyperlipidemia    Hypertension    Impotence of organic origin    Iron deficiency anemia    Mild atherosclerosis of carotid artery, bilateral    last doppler 12-17-2010   bilateral ICA 1-39%   Nocturia more than twice per night    Testicular hypofunction    Type 2 diabetes mellitus [facility])    endocrinologist-- dr kassie Aguas urinary stream    Wears glasses     PAST SURGICAL HISTORY: Past Surgical History:  Procedure Laterality Date   COLONOSCOPY     CYSTOSCOPY WITH INSERTION OF UROLIFT N/A 12/12/2017   Procedure: CYSTOSCOPY WITH INSERTION OF UROLIFT;  Surgeon: Nieves Cough, MD;  Location:  [facility];  Service: Urology;  Laterality: N/A;   SHOULDER SURGERY Right 2004, 1990   WISDOM TOOTH EXTRACTION      ALLERGIES: Allergies  Allergen Reactions   Trandolapril Swelling     w/ Angioedema   Actos  [Pioglitazone ] Swelling    edema   Azithromycin Rash   Other Rash    MAGIC MOUTHWASH   Tetracycline Rash    FAMILY HISTORY:  Family History  Problem Relation Age of Onset   Heart disease Mother 24       Deceased   Heart attack Mother    Hypertension Mother    Diabetes Father 36       Deceased   Hypertension Father    Heart disease Sister        heart trouble   Heart attack Sister    Healthy Sister        x6   Heart attack Sister     Diabetes Brother    Heart attack Brother    Cancer Paternal Aunt        x1-Bone   Cancer Paternal Aunt        multiple   Heart attack Maternal Grandmother    Colon cancer Neg Hx    Esophageal cancer Neg Hx    Liver cancer Neg Hx    Stomach cancer Neg Hx    Pancreatic cancer Neg Hx    Colon polyps Neg Hx    Rectal cancer Neg Hx     SOCIAL HISTORY: Social History   Socioeconomic History   Marital status: Married    Spouse name: [name]   Number of children: 0   Years of education: Not on file   Highest education level: Not on file  Occupational History    Employer: [employer]  Tobacco Use   Smoking status: Never   Smokeless tobacco: Never  Vaping Use   Vaping status: Never Used  Substance and Sexual Activity   Alcohol use: Not Currently    Alcohol/week: 0.0 standard drinks of alcohol   Drug use: No   Sexual activity: Yes  Other Topics Concern   Not on file  Social History Narrative   Regular exercise-yes   Caffeine-2 cups   Married=wife [name] 22 years   Cigar Smoker-quit smoking Jan 2010   Social Drivers of Corporate investment banker Strain: Not on file  Food Insecurity: Not on file  Transportation Needs: Not on file  Physical Activity: Not on file  Stress: Not on file  Social Connections: Not on file    MEDICATIONS:  Current Outpatient Medications  Medication Sig Dispense Refill   acyclovir (ZOVIRAX) 800 MG tablet Take 800 mg by mouth daily as needed (for flare-up).     aspirin 81 MG tablet Take 81 mg by mouth daily.     atenolol  (TENORMIN ) 50 MG tablet Take 1 tablet (50 mg total) by mouth daily. 30 tablet 0   cetirizine (ZYRTEC) 10 MG tablet Take 10 mg by mouth as needed.     diltiazem  (CARDIZEM ) 30 MG tablet Take 1 tablet (30 mg total) by mouth 4 (four) times daily as needed (elevated HR). 10 tablet 1   ezetimibe  (ZETIA ) 10 MG tablet Take 1 tablet (10 mg total) by mouth daily. 90 tablet 0   fluticasone (FLONASE) 50 MCG/ACT nasal spray  Place 1 spray into both nostrils daily as needed.     FREESTYLE LITE test strip USE 1 STRIP DAILY TO MONITOR GLUCOSE LEVELS 100 strip  3   gabapentin (NEURONTIN) 100 MG capsule Take 100 mg by mouth at bedtime.     Lancets (FREESTYLE) lancets 1 each by Other route daily. Use to monitor glucose levels daily; E11.9 100 each 2   losartan (COZAAR) 50 MG tablet Take 50 mg by mouth daily.     meclizine  (ANTIVERT ) 12.5 MG tablet Take 1 tablet (12.5 mg total) by mouth 2 (two) times daily as needed for dizziness. 10 tablet 0   Multiple Vitamin (MULTIVITAMIN) tablet Take 1 tablet by mouth daily.     olopatadine (PATANOL) 0.1 % ophthalmic solution Apply 1 drop to eye daily as needed (for itchy and dry eyes).     pantoprazole (PROTONIX) 40 MG tablet Take 40 mg by mouth 2 (two) times daily.     rosuvastatin  (CRESTOR ) 20 MG tablet TAKE 1 TABLET(20 MG) BY MOUTH DAILY 90 tablet 3   Semaglutide  (RYBELSUS ) 3 MG TABS Take 1 tablet (3 mg total) by mouth daily. 90 tablet 3   sildenafil (VIAGRA) 100 MG tablet Take 100 mg by mouth daily as needed for erectile dysfunction. Use as directed     triamcinolone  (KENALOG ) 0.1 % Apply 1 application  topically at bedtime.     XYOSTED 75 MG/0.5ML SOAJ Inject 75 mg as directed once a week.     empagliflozin  (JARDIANCE ) 25 MG TABS tablet Take 1 tablet (25 mg total) by mouth daily. 90 tablet 3   metFORMIN  (GLUCOPHAGE -XR) 500 MG 24 hr tablet Take 2 tablets (1,000 mg total) by mouth 2 (two) times daily with a meal. 360 tablet 4   No current facility-administered medications for this visit.    PHYSICAL EXAM: Vitals:   08/10/23 0832  BP: 118/62  Pulse: 64  Resp: 20  SpO2: 98%  Weight: 154 lb 9.6 oz (70.1 kg)  Height: 5' 7 (1.702 m)    Body mass index is 24.21 kg/m.  Wt Readings from Last 3 Encounters:  08/10/23 154 lb 9.6 oz (70.1 kg)  07/06/23 153 lb (69.4 kg)  04/10/23 159 lb (72.1 kg)    General: Well developed, well nourished male in no apparent distress.  HEENT:  AT/[place], no external lesions.  Eyes: Conjunctiva clear and no icterus. Neck: Neck supple  Lungs: Respirations not labored Neurologic: Alert, oriented, normal speech Extremities / Skin: Dry.   Psychiatric: Does not appear depressed or anxious   Diabetic Foot Exam - Simple   Simple Foot Form Diabetic Foot exam was performed with the following findings: Yes 08/10/2023  8:46 AM  Visual Inspection Sensation Testing Intact to touch and monofilament testing bilaterally: Yes Pulse Check Posterior Tibialis and Dorsalis pulse intact bilaterally: Yes Comments    LABS Reviewed Lab Results  Component Value Date   HGBA1C 7.5 (A) 08/10/2023   HGBA1C 7.7 (H) 04/05/2023   HGBA1C 6.9 (A) 12/09/2022   Lab Results  Component Value Date   FRUCTOSAMINE 239 10/03/2019   Lab Results  Component Value Date   CHOL 86 (L) 07/06/2023   HDL 33 (L) 07/06/2023   LDLCALC 39 07/06/2023   LDLDIRECT 146.5 10/29/2012   TRIG 63 07/06/2023   CHOLHDL 2.6 07/06/2023   Lab Results  Component Value Date   MICRALBCREAT 15 04/10/2023   MICRALBCREAT 2.5 01/31/2012   Lab Results  Component Value Date   CREATININE 0.83 04/05/2023   Lab Results  Component Value Date   GFR 77.91 09/01/2022    ASSESSMENT / PLAN  1. Uncontrolled type 2 diabetes mellitus with hyperglycemia, without long-term current use  of insulin (HCC)   2. Type 2 diabetes mellitus with hyperglycemia, with long-term current use of insulin (HCC)     Diabetes Mellitus type 2, complicated by diabetic neuropathy. - Diabetic status / severity: uncontrolled  Lab Results  Component Value Date   HGBA1C 7.5 (A) 08/10/2023    - Hemoglobin A1c goal : <6.5%  Discussed about managing diet and avoiding bedtime snacks.  Advised to limit carbohydrates.  Discussed about adding diabetic medication versus work on diet.  He prefers to work on diet.  He does not want to increase dose of Rybelsus  due to concern of significant weight loss.  - Medications:  No change. Continue Rybelsus  3 mg daily. Jardiance  25 mg daily. Metformin  XR 1000 mg two times a day.   - Home glucose testing: Daily in the morning fasting and at bedtime few times a week. - Discussed/ Gave Hypoglycemia treatment plan.  # Consult : not required at this time.   # Annual urine for microalbuminuria/ creatinine ratio, no microalbuminuria currently, continue ACE/ARB /losartan.  Last  Lab Results  Component Value Date   MICRALBCREAT 15 04/10/2023    # Foot check nightly / neuropathy.  Patient is also following with podiatry.  He has diabetic shoes.  He had changes with diabetic neuropathy with callus and bunions.  He will benefit from diabetic shoes and inserts.  # Annual dilated diabetic eye exams.  He has retinopathy.  Following with ophthalmology every 6 months.  - Diet: Make healthy diabetic food choices - Life style / activity / exercise: Discussed.  2. Blood pressure  -  BP Readings from Last 1 Encounters:  08/10/23 118/62    - Control is in target.  - No change in current plans.  3. Lipid status / Hyperlipidemia - Last  Lab Results  Component Value Date   LDLCALC 39 07/06/2023   - Continue rosuvastatin  20 mg daily.  Managed by primary care provider.  Diagnoses and all orders for this visit:  Uncontrolled type 2 diabetes mellitus with hyperglycemia, without long-term current use of insulin (HCC) -     POCT glycosylated hemoglobin (Hb A1C) -     Semaglutide  (RYBELSUS ) 3 MG TABS; Take 1 tablet (3 mg total) by mouth daily. -     empagliflozin  (JARDIANCE ) 25 MG TABS tablet; Take 1 tablet (25 mg total) by mouth daily.  Type 2 diabetes mellitus with hyperglycemia, with long-term current use of insulin (HCC) -     metFORMIN  (GLUCOPHAGE -XR) 500 MG 24 hr tablet; Take 2 tablets (1,000 mg total) by mouth 2 (two) times daily with a meal.    DISPOSITION Follow up in clinic in 4 months suggested.   All questions answered and patient verbalized  understanding of the plan.  [place] Charlise Giovanetti, MD [facility] Endocrinology [facility] [address] [place], [address] [place], [place] [address] Phone # [phone]  At least part of this note was generated using voice recognition software. Inadvertent word errors may have occurred, which were not recognized during the proofreading process.

## 2023-08-17 ENCOUNTER — Ambulatory Visit (HOSPITAL_COMMUNITY)
Admission: RE | Admit: 2023-08-17 | Discharge: 2023-08-17 | Disposition: A | Discharge status: Home / Self Care | Source: Ambulatory Visit | Attending: Cardiology | Admitting: Cardiology

## 2023-08-17 DIAGNOSIS — I471 Supraventricular tachycardia, unspecified: Secondary | ICD-10-CM

## 2023-08-17 DIAGNOSIS — I340 Nonrheumatic mitral (valve) insufficiency: Secondary | ICD-10-CM | POA: Diagnosis not present

## 2023-08-17 LAB — ECHOCARDIOGRAM COMPLETE
Area-P 1/2: 3.36 cm2
P 1/2 time: 853 ms
S' Lateral: 2.8 cm

## 2023-08-18 ENCOUNTER — Ambulatory Visit (INDEPENDENT_AMBULATORY_CARE_PROVIDER_SITE_OTHER): Payer: Medicare Other | Admitting: Otolaryngology

## 2023-08-19 ENCOUNTER — Other Ambulatory Visit: Payer: Self-pay | Admitting: Internal Medicine

## 2023-10-07 ENCOUNTER — Encounter (HOSPITAL_BASED_OUTPATIENT_CLINIC_OR_DEPARTMENT_OTHER): Payer: Self-pay | Admitting: Emergency Medicine

## 2023-10-07 ENCOUNTER — Emergency Department (HOSPITAL_BASED_OUTPATIENT_CLINIC_OR_DEPARTMENT_OTHER)
Admission: EM | Admit: 2023-10-07 | Discharge: 2023-10-07 | Disposition: A | Discharge status: Home / Self Care | Attending: Emergency Medicine | Admitting: Emergency Medicine

## 2023-10-07 DIAGNOSIS — T783XXA Angioneurotic edema, initial encounter: Secondary | ICD-10-CM | POA: Insufficient documentation

## 2023-10-07 DIAGNOSIS — Z7982 Long term (current) use of aspirin: Secondary | ICD-10-CM | POA: Diagnosis not present

## 2023-10-07 DIAGNOSIS — K130 Diseases of lips: Secondary | ICD-10-CM | POA: Diagnosis present

## 2023-10-07 MED ORDER — FAMOTIDINE IN NACL 20-0.9 MG/50ML-% IV SOLN
20.0000 mg | Freq: Once | INTRAVENOUS | Status: AC
  Administered 2023-10-07: 20 mg via INTRAVENOUS
  Filled 2023-10-07: qty 50

## 2023-10-07 MED ORDER — DIPHENHYDRAMINE HCL 50 MG/ML IJ SOLN
25.0000 mg | Freq: Once | INTRAMUSCULAR | Status: AC
  Administered 2023-10-07: 25 mg via INTRAVENOUS
  Filled 2023-10-07: qty 1

## 2023-10-07 MED ORDER — METHYLPREDNISOLONE SODIUM SUCC 125 MG IJ SOLR
125.0000 mg | Freq: Once | INTRAMUSCULAR | Status: AC
  Administered 2023-10-07: 125 mg via INTRAVENOUS
  Filled 2023-10-07: qty 2

## 2023-10-07 NOTE — ED Provider Notes (Signed)
 [place] EMERGENCY DEPARTMENT AT [facility] Provider Note   CSN: [id number] Arrival date & time: 10/07/23  9965     Patient presents with: No chief complaint on file.   [name] is a 73 y.o. male.   The patient is presenting with left lower lip swelling that began earlier this evening while preparing for bed. The patient reports that the swelling has progressively increased in size, affecting the ability to function on the affected side. There is no associated rash, shortness of breath, lightheadedness, or throat/tongue swelling. The patient denies any recent changes in medications and has not taken any over-the-counter medications recently. The patient has a history of diabetes and hypertension, for which they take multiple medications, including blood pressure medication. The patient recently returned from a trip to [place]  and consumed a new type of trail mix containing raisins, pineapple, and nuts, which they have not had before. The patient has experienced similar lip swelling episodes in the past, occurring intermittently, with the last episode being approximately four years ago. There is no known history of insect bites or stings. The patient denies any gastrointestinal symptoms such as stomach pain or diarrhea. History was obtained from the patient.        Prior to Admission medications   Medication Sig Start Date End Date Taking? Authorizing Provider  acyclovir (ZOVIRAX) 800 MG tablet Take 800 mg by mouth daily as needed (for flare-up). 02/15/13   [provider]  aspirin 81 MG tablet Take 81 mg by mouth daily.    [provider]  atenolol  (TENORMIN ) 50 MG tablet Take 1 tablet (50 mg total) by mouth daily. 05/18/20   Frann Mabel Mt, DO  cetirizine (ZYRTEC) 10 MG tablet Take 10 mg by mouth as needed. 03/08/21   [provider]  diltiazem  (CARDIZEM ) 30 MG tablet Take 1 tablet (30 mg total) by mouth 4 (four) times daily as needed  (elevated HR). 05/07/21   Chandrasekhar, Mahesh A, MD  empagliflozin  (JARDIANCE ) 25 MG TABS tablet Take 1 tablet (25 mg total) by mouth daily. 08/10/23   Thapa, [place], MD  ezetimibe  (ZETIA ) 10 MG tablet TAKE 1 TABLET BY MOUTH ONCE DAILY 08/21/23   Chandrasekhar, Mahesh A, MD  fluticasone (FLONASE) 50 MCG/ACT nasal spray Place 1 spray into both nostrils daily as needed. 05/08/23   [provider]  FREESTYLE LITE test strip USE 1 STRIP DAILY TO MONITOR GLUCOSE LEVELS 03/30/21   Kassie Mallick, MD  gabapentin (NEURONTIN) 100 MG capsule Take 100 mg by mouth at bedtime. 04/19/23   [provider]  Lancets (FREESTYLE) lancets 1 each by Other route daily. Use to monitor glucose levels daily; E11.9 09/19/18   Kassie Mallick, MD  losartan (COZAAR) 50 MG tablet Take 50 mg by mouth daily. 12/14/20   [provider]  meclizine  (ANTIVERT ) 12.5 MG tablet Take 1 tablet (12.5 mg total) by mouth 2 (two) times daily as needed for dizziness. 02/26/20   Joldersma, Logan, PA-C  metFORMIN  (GLUCOPHAGE -XR) 500 MG 24 hr tablet Take 2 tablets (1,000 mg total) by mouth 2 (two) times daily with a meal. 08/10/23   Thapa, [place], MD  Multiple Vitamin (MULTIVITAMIN) tablet Take 1 tablet by mouth daily.    [provider]  olopatadine (PATANOL) 0.1 % ophthalmic solution Apply 1 drop to eye daily as needed (for itchy and dry eyes).    [provider]  pantoprazole (PROTONIX) 40 MG tablet Take 40 mg by mouth 2 (two) times daily. 06/18/23   [provider]  rosuvastatin  (CRESTOR ) 20 MG tablet TAKE 1 TABLET(20 MG) BY MOUTH DAILY 02/07/23   Wyn Jackee VEAR Mickey., NP  Semaglutide  (RYBELSUS ) 3 MG TABS Take 1 tablet (3 mg total) by mouth daily. 08/10/23   Thapa, [place], MD  sildenafil (VIAGRA) 100 MG tablet Take 100 mg by mouth daily as needed for erectile dysfunction. Use as directed    [provider]  triamcinolone  (KENALOG ) 0.1 % Apply 1 application  topically at bedtime. 12/06/19   [provider]  XYOSTED 75 MG/0.5ML SOAJ Inject 75 mg as directed once a week. 11/07/22   [provider]    Allergies: Trandolapril, Actos  [pioglitazone ], Azithromycin, Other, and Tetracycline    Review of Systems  Updated Vital Signs BP (!) 129/107   Pulse 68   Temp 97.8 F (36.6 C)   Resp 15   SpO2 100%   Physical Exam Vitals and nursing note reviewed.  Constitutional:      Appearance: He is well-developed.  HENT:     Head: Normocephalic and atraumatic.     Comments: Left lower lip edema, no sublingual edema, oropharyngeal edema/erythema.  Cardiovascular:     Rate and Rhythm: Normal rate.  Pulmonary:     Effort: Pulmonary effort is normal. No respiratory distress.  Abdominal:     General: There is no distension.  Musculoskeletal:        General: Normal range of motion.     Cervical back: Normal range of motion.  Neurological:     Mental Status: He is alert.     (all labs ordered are listed, but only abnormal results are displayed) Labs Reviewed - No data to display  EKG: None  Radiology: No results found.   Procedures   Medications Ordered in the ED  methylPREDNISolone sodium succinate (SOLU-MEDROL) 125 mg/2 mL injection 125 mg (125 mg Intravenous Given 10/07/23 0143)  famotidine (PEPCID) IVPB 20 mg premix (0 mg Intravenous Stopped 10/07/23 0213)  diphenhydrAMINE (BENADRYL) injection 25 mg (25 mg Intravenous Given 10/07/23 0144)                                    Medical Decision Making Risk Prescription drug management.   The patient presented to the ED with left lower lip swelling, which started after consuming a new type of trail mix. The patient denied any rashes, shortness of breath, or other allergic symptoms. The patient has a history of diabetes and hypertension, and is on multiple medications, including an angiotensin receptor blocker (losartan), which could potentially cause angioedema. The patient reported previous episodes of lip  swelling, but not as severe or localized to one side. The patient was monitored in the ED to ensure the swelling did not progress to the tongue or throat, which could compromise the airway. The patient was educated on the potential causes of angioedema, including idiopathic and medication-related causes, and the limited role of medications in treating this condition. The patient was advised to follow up with their primary care physician for further evaluation and management, including the potential need for an EpiPen.  Differential Diagnosis:   Differential diagnosis includes but is not limited to: medication-induced angioedema, idiopathic angioedema, allergic reaction to food, hereditary angioedema.  Diagnostics Review:  Historians other than the patient: None  Care significantly affected by the following chronic Conditions: Diabetes, Hypertension  Management:  Re-Evaluations/Course of Care: The patient was monitored for a couple of hours  to ensure that the swelling did not progress to involve the tongue or throat. The patient was advised that the swelling would likely improve over the next 48 to 72 hours.  Potential for Clinical Deterioration: Yes, there is a risk of airway compromise if the swelling progresses to involve the tongue or throat, which could become life-threatening.  Dispo: The patient was discharged with instructions to follow up with their primary care physician for further evaluation and management of recurrent angioedema.  High Risk of Morbidity/Mortality Consideration: Yes, the potential for airway compromise due to progression of angioedema was considered a life-threatening concern.  Final diagnoses:  Angioedema, initial encounter    ED Discharge Orders     None          Juma Oxley, Selinda, MD 10/07/23 [id number]

## 2023-10-07 NOTE — ED Triage Notes (Signed)
 Swelling to left corn bottom lip. Occurred after pulling clothing out of closet. Started around 2200. Hx angio edema with ACE, but no longer takes. No puncture or obvious bite seen. Not painful. Throat clear.

## 2023-10-12 ENCOUNTER — Telehealth: Payer: Self-pay | Admitting: Internal Medicine

## 2023-10-12 DIAGNOSIS — R0609 Other forms of dyspnea: Secondary | ICD-10-CM

## 2023-10-12 DIAGNOSIS — Z79899 Other long term (current) drug therapy: Secondary | ICD-10-CM

## 2023-10-12 DIAGNOSIS — R079 Chest pain, unspecified: Secondary | ICD-10-CM

## 2023-10-12 DIAGNOSIS — R058 Other specified cough: Secondary | ICD-10-CM

## 2023-10-12 NOTE — Telephone Encounter (Signed)
 [name] LABOR, MD to [facility] Triage  Randy Hamp SAILOR, RN (Selected Message) [facility]    10/12/23  3:44 PM I agree with you- the chest pain sounds non-cardiac and related to his cough.  Let's get a BNP.  If normal, PCP f/u would be appropriate.  If elevated, he can see my team largely to make sure his cough is non-cardiac in origin.   Thanks, MAC   S/w the patient, gave him the information above. He will come to our lab at [address] [facility] to get lab work tomorrow. He verbalized understanding of all information. Lab order placed and released

## 2023-10-12 NOTE — Addendum Note (Signed)
 Addended by: Kennie Karapetian L on: 10/12/2023 04:16 PM   Modules accepted: Orders

## 2023-10-12 NOTE — Telephone Encounter (Signed)
   Pt c/o of Chest Pain: STAT if active CP, including tightness, pressure, jaw pain, radiating pain to shoulder/upper arm/back, CP unrelieved by Nitro. Symptoms reported of SOB, nausea, vomiting, sweating.  1. Are you having CP right now? no    2. Are you experiencing any other symptoms (ex. SOB, nausea, vomiting, sweating)? Patient states he has a cough every now and then.   3. Is your CP continuous or coming and going? Comes and goes, mostly in the mornings.    4. Have you taken Nitroglycerin ? no   5. How long have you been experiencing CP? For the past two weeks.     6. If NO CP at time of call then end call with telling Pt to call back or call 911 if Chest pain returns prior to return call from triage team.

## 2023-10-12 NOTE — Telephone Encounter (Signed)
 Call to patient to discuss concerns.   Patient states that for the past 2 weeks he has been having chest pain lasting a few minutes when he coughs and when he gets in and out of bed. He reports it seems to move from the left to the right. He has it occasionally at other times of the day, but his chest pain generally seems to follow this pattern of happening around coughing or getting in or out of bed. He states his cough is not productive, but the cough and the chest pain started around the same time. He denies any sick contacts, SOB or leg swelling. He does not track his HR and BP at home.   He also reports he goes to a senior exercise class 3-4 times a week, and has been going most of the year. He does endorse that he lifts weights above his head during this class.   He denies any injury to chest wall recently, but does report he went to ED on 10/07/23 for lip swelling episode. He states he has had this before and it seems to be related to unfamiliar foods. He reports lip swelling has fully resolved and he had no chest pain during that episode. Patient responses forwarded to Dr. Santo.

## 2023-11-17 ENCOUNTER — Other Ambulatory Visit: Payer: Self-pay

## 2023-11-17 DIAGNOSIS — E1159 Type 2 diabetes mellitus with other circulatory complications: Secondary | ICD-10-CM

## 2023-11-17 MED ORDER — FREESTYLE LITE TEST VI STRP: ORAL_STRIP | 3 refills | Status: AC

## 2023-12-11 ENCOUNTER — Ambulatory Visit: Payer: Self-pay | Admitting: Endocrinology

## 2023-12-11 ENCOUNTER — Encounter: Payer: Self-pay | Admitting: Endocrinology

## 2023-12-11 ENCOUNTER — Ambulatory Visit: Admitting: Endocrinology

## 2023-12-11 VITALS — BP 110/68 | HR 67 | Resp 18 | Ht 67.0 in | Wt 148.4 lb

## 2023-12-11 DIAGNOSIS — Z7984 Long term (current) use of oral hypoglycemic drugs: Secondary | ICD-10-CM | POA: Diagnosis not present

## 2023-12-11 DIAGNOSIS — E1159 Type 2 diabetes mellitus with other circulatory complications: Secondary | ICD-10-CM | POA: Diagnosis not present

## 2023-12-11 LAB — POCT GLYCOSYLATED HEMOGLOBIN (HGB A1C): Hemoglobin A1C: 7.5 % — AB (ref 4.0–5.6)

## 2023-12-11 NOTE — Progress Notes (Signed)
 Outpatient Endocrinology Note Aidan Moten, MD  12/11/23  Patient's Name: [name]    DOB: 1950/11/29    MRN: [id number]                                                    REASON OF VISIT: Follow up for type 2 diabetes mellitus  PCP: Carlette Palmatier, MD  HISTORY OF PRESENT ILLNESS:   [name] is a 73 y.o. old male with past medical history listed below, is here for follow up of type 2 diabetes mellitus.   Pertinent Diabetes History: Patient was diagnosed with type 2 diabetes mellitus in 1996.  He has been on different antidiabetic medications including metformin , acarbose , Prandin , Invokana  at different times in the past.  He has relatively controlled diabetes mellitus with hemoglobin A1c mostly in the range of 7s%.  Chronic Diabetes Complications : Retinopathy: yes, following with ophthalmology. Last ophthalmology exam was done on 10/2022. Every 6 months.  Nephropathy: no Peripheral neuropathy: yes, following with podiatry.  Coronary artery disease: no Stroke: no  Relevant comorbidities and cardiovascular risk factors: Obesity: no Body mass index is 23.24 kg/m.  Hypertension: yes Hyperlipidemia. Yes, on statin  Current / Home Diabetic regimen includes: Rybelsus  3 mg daily. Jardiance  25 mg daily. Metformin  XR 1000 mg two times a day.  Prior diabetic medications: Actos  : edema Rybelsus  : lost too much weight on Rybelsus  7 mg.   Glycemic data:   Forgot to glucometer in the clinic today. Reports mostly glucose in 119-144 range.   Hypoglycemia: Patient has no hypoglycemic episodes. Patient has hypoglycemia awareness.  Factors modifying glucose control: 1.  Diabetic diet assessment: 3 meals a day.  Fruits frequently.  2.  Staying active or exercising: Exercising 3 to 4 days a week.  3.  Medication compliance: compliant all of the time.  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  Interval history  Hemoglobin A1c  7.5% today.  He lost 3 pound of weight since last visit.  He has been on Rybelsus  and diabetes regimen as reviewed and noted above.  Denies numbness and ting his feet.  No vision problem.  He reports he has been eating fruits banana, apple, berries frequently throughout the day.  REVIEW OF SYSTEMS As per history of present illness.   PAST MEDICAL HISTORY: Past Medical History:  Diagnosis Date   Arthritis    right shoulder   BBB (bundle branch block)    BPH (benign prostatic hyperplasia)    Hyperlipidemia    Hypertension    Impotence of organic origin    Iron deficiency anemia    Mild atherosclerosis of carotid artery, bilateral    last doppler 12-17-2010   bilateral ICA 1-39%   Nocturia more than twice per night    Testicular hypofunction    Type 2 diabetes mellitus [facility])    endocrinologist-- dr kassie Aguas urinary stream    Wears glasses     PAST SURGICAL HISTORY: Past Surgical History:  Procedure Laterality Date   COLONOSCOPY     CYSTOSCOPY WITH INSERTION OF UROLIFT N/A 12/12/2017   Procedure: CYSTOSCOPY WITH INSERTION OF UROLIFT;  Surgeon: Nieves Cough, MD;  Location[facility] [place];  Service: Urology;  Laterality: N/A;   SHOULDER SURGERY Right 2004, 1990   WISDOM TOOTH EXTRACTION  ALLERGIES: Allergies  Allergen Reactions   Trandolapril Swelling     w/ Angioedema   Actos  [Pioglitazone ] Swelling    edema   Azithromycin Rash   Other Rash    MAGIC MOUTHWASH   Tetracycline Rash    FAMILY HISTORY:  Family History  Problem Relation Age of Onset   Heart disease Mother 6       Deceased   Heart attack Mother    Hypertension Mother    Diabetes Father 28       Deceased   Hypertension Father    Heart disease Sister        heart trouble   Heart attack Sister    Healthy Sister        x6   Heart attack Sister    Diabetes Brother    Heart attack Brother    Cancer Paternal Aunt        x1-Bone   Cancer Paternal Aunt        multiple    Heart attack Maternal Grandmother    Colon cancer Neg Hx    Esophageal cancer Neg Hx    Liver cancer Neg Hx    Stomach cancer Neg Hx    Pancreatic cancer Neg Hx    Colon polyps Neg Hx    Rectal cancer Neg Hx     SOCIAL HISTORY: Social History   Socioeconomic History   Marital status: Married    Spouse name: [name]   Number of children: 0   Years of education: Not on file   Highest education level: Not on file  Occupational History    Employer: [employer]  Tobacco Use   Smoking status: Never   Smokeless tobacco: Never  Vaping Use   Vaping status: Never Used  Substance and Sexual Activity   Alcohol use: Not Currently    Alcohol/week: 0.0 standard drinks of alcohol   Drug use: No   Sexual activity: Yes  Other Topics Concern   Not on file  Social History Narrative   Regular exercise-yes   Caffeine-2 cups   Married=wife [name] 22 years   Cigar Smoker-quit smoking Jan 2010   Social Drivers of Corporate Investment Banker Strain: Not on file  Food Insecurity: Not on file  Transportation Needs: Not on file  Physical Activity: Not on file  Stress: Not on file  Social Connections: Not on file    MEDICATIONS:  Current Outpatient Medications  Medication Sig Dispense Refill   acyclovir (ZOVIRAX) 800 MG tablet Take 800 mg by mouth daily as needed (for flare-up).     aspirin 81 MG tablet Take 81 mg by mouth daily.     atenolol  (TENORMIN ) 50 MG tablet Take 1 tablet (50 mg total) by mouth daily. 30 tablet 0   cetirizine (ZYRTEC) 10 MG tablet Take 10 mg by mouth as needed.     diltiazem  (CARDIZEM ) 30 MG tablet Take 1 tablet (30 mg total) by mouth 4 (four) times daily as needed (elevated HR). 10 tablet 1   empagliflozin  (JARDIANCE ) 25 MG TABS tablet Take 1 tablet (25 mg total) by mouth daily. 90 tablet 3   ezetimibe  (ZETIA ) 10 MG tablet TAKE 1 TABLET BY MOUTH ONCE DAILY 90 tablet 3   fluticasone (FLONASE) 50 MCG/ACT nasal spray Place 1 spray into both nostrils daily  as needed.     gabapentin (NEURONTIN) 100 MG capsule Take 100 mg by mouth at bedtime.     glucose blood (FREESTYLE LITE) test strip Use  as instructed 100 strip 3   Lancets (FREESTYLE) lancets 1 each by Other route daily. Use to monitor glucose levels daily; E11.9 100 each 2   losartan (COZAAR) 50 MG tablet Take 50 mg by mouth daily.     meclizine  (ANTIVERT ) 12.5 MG tablet Take 1 tablet (12.5 mg total) by mouth 2 (two) times daily as needed for dizziness. 10 tablet 0   metFORMIN  (GLUCOPHAGE -XR) 500 MG 24 hr tablet Take 2 tablets (1,000 mg total) by mouth 2 (two) times daily with a meal. 360 tablet 4   Multiple Vitamin (MULTIVITAMIN) tablet Take 1 tablet by mouth daily.     olopatadine (PATANOL) 0.1 % ophthalmic solution Apply 1 drop to eye daily as needed (for itchy and dry eyes).     pantoprazole (PROTONIX) 40 MG tablet Take 40 mg by mouth 2 (two) times daily.     rosuvastatin  (CRESTOR ) 20 MG tablet TAKE 1 TABLET(20 MG) BY MOUTH DAILY 90 tablet 3   Semaglutide  (RYBELSUS ) 3 MG TABS Take 1 tablet (3 mg total) by mouth daily. 90 tablet 3   sildenafil (VIAGRA) 100 MG tablet Take 100 mg by mouth daily as needed for erectile dysfunction. Use as directed     triamcinolone  (KENALOG ) 0.1 % Apply 1 application  topically at bedtime.     XYOSTED 75 MG/0.5ML SOAJ Inject 75 mg as directed once a week.     No current facility-administered medications for this visit.    PHYSICAL EXAM: Vitals:   12/11/23 0928  BP: 110/68  Pulse: 67  Resp: 18  SpO2: 98%  Weight: 148 lb 6.4 oz (67.3 kg)  Height: 5' 7 (1.702 m)    Body mass index is 23.24 kg/m.  Wt Readings from Last 3 Encounters:  12/11/23 148 lb 6.4 oz (67.3 kg)  08/10/23 154 lb 9.6 oz (70.1 kg)  07/06/23 153 lb (69.4 kg)    General: Well developed, well nourished male in no apparent distress.  HEENT: AT/[place], no external lesions.  Eyes: Conjunctiva clear and no icterus. Neck: Neck supple  Lungs: Respirations not labored Neurologic: Alert,  oriented, normal speech Extremities / Skin: Dry.   Psychiatric: Does not appear depressed or anxious   Diabetic Foot Exam - Simple   No data filed    LABS Reviewed Lab Results  Component Value Date   HGBA1C 7.5 (A) 12/11/2023   HGBA1C 7.5 (A) 08/10/2023   HGBA1C 7.7 (H) 04/05/2023   Lab Results  Component Value Date   FRUCTOSAMINE 239 10/03/2019   Lab Results  Component Value Date   CHOL 86 (L) 07/06/2023   HDL 33 (L) 07/06/2023   LDLCALC 39 07/06/2023   LDLDIRECT 146.5 10/29/2012   TRIG 63 07/06/2023   CHOLHDL 2.6 07/06/2023   Lab Results  Component Value Date   MICRALBCREAT 15 04/10/2023   MICRALBCREAT 2.5 01/31/2012   Lab Results  Component Value Date   CREATININE 0.83 04/05/2023   Lab Results  Component Value Date   GFR 77.91 09/01/2022    ASSESSMENT / PLAN  1. Type 2 diabetes mellitus with other circulatory complication, without long-term current use of insulin (HCC)     Diabetes Mellitus type 2, complicated by diabetic neuropathy. - Diabetic status / severity: uncontrolled  Lab Results  Component Value Date   HGBA1C 7.5 (A) 12/11/2023    - Hemoglobin A1c goal : <6.5%  Still suboptimal control of diabetes.  Discussed about increasing dose of Rybelsus , he does not want to increase, does not want to lose  any more weight.  Discussed about limiting foods banana, apple, and berries.  Advised to increase amount of vegetables.  He will work on diet without increasing medications.  - Medications: No change. Continue Rybelsus  3 mg daily. Jardiance  25 mg daily. Metformin  XR 1000 mg two times a day.   - Home glucose testing: Daily in the morning fasting and at bedtime few times a week. - Discussed/ Gave Hypoglycemia treatment plan.  # Consult : not required at this time.   # Annual urine for microalbuminuria/ creatinine ratio, no microalbuminuria currently, continue ACE/ARB /losartan.  Last  Lab Results  Component Value Date   MICRALBCREAT 15  04/10/2023    # Foot check nightly / neuropathy.  Patient is also following with podiatry.  He has diabetic shoes.  He had changes with diabetic neuropathy with callus and bunions.  He will benefit from diabetic shoes and inserts.  # Annual dilated diabetic eye exams.  He has retinopathy.  Following with ophthalmology every 6 months.  - Diet: Make healthy diabetic food choices - Life style / activity / exercise: Discussed.  2. Blood pressure  -  BP Readings from Last 1 Encounters:  12/11/23 110/68    - Control is in target.  - No change in current plans.  3. Lipid status / Hyperlipidemia - Last  Lab Results  Component Value Date   LDLCALC 39 07/06/2023   - Continue rosuvastatin  20 mg daily.  Managed by primary care provider.  [name] was seen today for diabetes.  Diagnoses and all orders for this visit:  Type 2 diabetes mellitus with other circulatory complication, without long-term current use of insulin (HCC) -     POCT glycosylated hemoglobin (Hb A1C) -     Basic metabolic panel with GFR -     Microalbumin / creatinine urine ratio -     Hemoglobin A1c    DISPOSITION Follow up in clinic in 4 months suggested.  Labs prior to follow-up visit as ordered.   All questions answered and patient verbalized understanding of the plan.  Harold Muilenburg, MD [facility] Endocrinology [facility] [address] [place], [address] [place], [place] [address] Phone # [phone]  At least part of this note was generated using voice recognition software. Inadvertent word errors may have occurred, which were not recognized during the proofreading process.

## 2024-04-04 ENCOUNTER — Other Ambulatory Visit

## 2024-04-09 ENCOUNTER — Ambulatory Visit: Admitting: Endocrinology
